# Patient Record
Sex: Male | Born: 1955 | Race: Black or African American | Hispanic: No | Marital: Married | Smoking: Never smoker
Health system: Southern US, Community
[De-identification: ages and names within clinical notes are randomized; demographics above are authoritative.]

## PROBLEM LIST (undated history)

## (undated) DIAGNOSIS — N401 Enlarged prostate with lower urinary tract symptoms: Secondary | ICD-10-CM

## (undated) DIAGNOSIS — M1990 Unspecified osteoarthritis, unspecified site: Secondary | ICD-10-CM

## (undated) DIAGNOSIS — F410 Panic disorder [episodic paroxysmal anxiety] without agoraphobia: Secondary | ICD-10-CM

## (undated) DIAGNOSIS — I10 Essential (primary) hypertension: Secondary | ICD-10-CM

## (undated) DIAGNOSIS — K219 Gastro-esophageal reflux disease without esophagitis: Secondary | ICD-10-CM

## (undated) DIAGNOSIS — N138 Other obstructive and reflux uropathy: Secondary | ICD-10-CM

## (undated) DIAGNOSIS — N189 Chronic kidney disease, unspecified: Secondary | ICD-10-CM

## (undated) DIAGNOSIS — E785 Hyperlipidemia, unspecified: Secondary | ICD-10-CM

## (undated) HISTORY — DX: Chronic kidney disease, unspecified: N18.9

## (undated) HISTORY — DX: Panic disorder (episodic paroxysmal anxiety): F41.0

## (undated) HISTORY — PX: COLONOSCOPY: SHX174

## (undated) HISTORY — DX: Other obstructive and reflux uropathy: N40.1

## (undated) HISTORY — DX: Other obstructive and reflux uropathy: N13.8

---

## 2001-08-24 ENCOUNTER — Encounter: Payer: Self-pay | Admitting: General Surgery

## 2001-08-24 ENCOUNTER — Ambulatory Visit (HOSPITAL_COMMUNITY): Admission: RE | Admit: 2001-08-24 | Discharge: 2001-08-24 | Payer: Self-pay | Admitting: Internal Medicine

## 2006-02-09 ENCOUNTER — Ambulatory Visit (HOSPITAL_COMMUNITY): Admission: RE | Admit: 2006-02-09 | Discharge: 2006-02-09 | Payer: Self-pay | Admitting: Family Medicine

## 2006-03-28 ENCOUNTER — Ambulatory Visit (HOSPITAL_COMMUNITY): Payer: Self-pay | Admitting: Oncology

## 2006-03-28 ENCOUNTER — Encounter (HOSPITAL_COMMUNITY): Admission: RE | Admit: 2006-03-28 | Discharge: 2006-04-27 | Payer: Self-pay | Admitting: Oncology

## 2006-03-28 ENCOUNTER — Encounter: Admission: RE | Admit: 2006-03-28 | Discharge: 2006-03-28 | Payer: Self-pay | Admitting: Oncology

## 2006-04-10 ENCOUNTER — Ambulatory Visit: Payer: Self-pay | Admitting: Internal Medicine

## 2006-06-09 ENCOUNTER — Ambulatory Visit: Payer: Self-pay | Admitting: Internal Medicine

## 2006-11-01 ENCOUNTER — Encounter: Payer: Self-pay | Admitting: Internal Medicine

## 2006-11-01 DIAGNOSIS — I10 Essential (primary) hypertension: Secondary | ICD-10-CM | POA: Insufficient documentation

## 2006-11-01 DIAGNOSIS — F411 Generalized anxiety disorder: Secondary | ICD-10-CM

## 2006-11-01 DIAGNOSIS — E785 Hyperlipidemia, unspecified: Secondary | ICD-10-CM | POA: Insufficient documentation

## 2006-11-01 DIAGNOSIS — K219 Gastro-esophageal reflux disease without esophagitis: Secondary | ICD-10-CM | POA: Insufficient documentation

## 2006-11-01 DIAGNOSIS — J309 Allergic rhinitis, unspecified: Secondary | ICD-10-CM | POA: Insufficient documentation

## 2006-11-01 DIAGNOSIS — F419 Anxiety disorder, unspecified: Secondary | ICD-10-CM | POA: Insufficient documentation

## 2006-11-01 DIAGNOSIS — M1990 Unspecified osteoarthritis, unspecified site: Secondary | ICD-10-CM | POA: Insufficient documentation

## 2008-03-10 ENCOUNTER — Ambulatory Visit: Payer: Self-pay | Admitting: Internal Medicine

## 2008-03-10 ENCOUNTER — Ambulatory Visit (HOSPITAL_COMMUNITY): Admission: RE | Admit: 2008-03-10 | Discharge: 2008-03-10 | Payer: Self-pay | Admitting: Internal Medicine

## 2010-01-14 ENCOUNTER — Ambulatory Visit (HOSPITAL_COMMUNITY): Admission: RE | Admit: 2010-01-14 | Discharge: 2010-01-14 | Payer: Self-pay | Admitting: Family Medicine

## 2011-02-08 NOTE — Op Note (Signed)
NAME:  Bruce Adkins, Bruce Adkins               ACCOUNT NO.:  [id number]   MEDICAL RECORD NO.:  [id number]          PATIENT TYPE:  AMB   LOCATION:  DAY                           FACILITY:  [facility]   PHYSICIAN:  R. Roetta Sessions, M.D. DATE OF BIRTH:  Nov 19, 1955   DATE OF PROCEDURE:  03/10/2008  DATE OF DISCHARGE:                               OPERATIVE REPORT   INDICATIONS FOR PROCEDURE:  A 55 year old African American male with  lower GI tract symptoms referred by Dr. Loleta Chance for colorectal cancer  screening.  He has never had a colonoscopy.  There is no family history  of colon cancer or polyps.  Colonoscopy is now being done as screening  maneuver.  Risks, benefits, alternatives, and limitations have been  reviewed, questions answered, and all parties agreeable.  Please see the  documentation in the medical record.   PROCEDURE NOTE:  O2 saturation, blood pressure, and pulse of the patient  monitored throughout the entire procedure.  Conscious sedation, Versed 3  mg IV, Demerol 75 mg IV in divided doses.   INSTRUMENT:  Pentax video chip system.   FINDINGS:  Digital rectal exam revealed no abnormalities.  Endoscopic  findings:  The prep was good.  Colon:  Colonic mucosa was surveyed from  the rectosigmoid junction through the left transverse, right colon,  appendiceal orifice, ileocecal valve, and cecum.  These structures were  well seen and photographed for the record.  Terminal ileum was intubated  to 10-cm from this level.  The scope was slowly withdrawn.  All previous  mentioned mucosal surfaces were again seen.  The colonic mucosa appeared  normal except for some trivial suction trauma at the base of the cecum.  The scope was pulled down to the rectum with examination of rectal  mucosa, including retroflexed view of the anal verge, demonstrated no  abnormalities.  The patient tolerated the procedure well was reacted in  Endoscopy.   IMPRESSION:  Normal rectum:  Terminal ileum.   RECOMMENDATIONS:  A repeat screening colonoscopy 10 years.      Jonathon Bellows, M.D.  Electronically Signed     RMR/MEDQ  D:  03/10/2008  T:  03/10/2008  Job:  [id number]   cc:   Annia Friendly. Loleta Chance, MD  Fax: [phone]

## 2011-02-11 NOTE — H&P (Signed)
[facility]  Patient:    Bruce Adkins, Bruce Adkins Visit Number: [id number] MRN: [id number]          Service Type: OUT Location: RAD Attending Physician:  Dessa Phi Dictated by:   Elpidio Anis, M.D. Admit Date:  08/24/2001 Discharge Date: 08/24/2001                           History and Physical  HISTORY OF PRESENT ILLNESS:  [age]-year-old male with history of recurrent abdominal pain, cold chills, shakes, nausea with or without vomiting.  He sometimes has fever with diaphoresis.  He has had multiple recent episodes.  These episodes always happen in the late evening or night. There has been no association with meals.  Patient is scheduled for upper endoscopy.  Gallbladder ultrasound is normal.  HIDA scan is pending.  MEDICATIONS:  He takes no chronic medications.  PAST SURGICAL HISTORY:  He has had no prior surgery.  PAST HISTORY:  He has mild anxiety disorder.  REVIEW OF SYSTEMS:  Otherwise negative.  PHYSICAL EXAMINATION:  VITAL SIGNS:  Blood pressure 144/98, pulse 72, respirations 20.  Weight 193 pounds.  HEENT:  Unremarkable.  NECK:  Supple without JVD or bruit.  CHEST:  Clear to auscultation.  No rales, rubs, rhonchi or wheezes.  HEART:  Regular rate and rhythm without murmur, gallop or rub.  ABDOMEN:  Soft, nontender.  No masses.  RECTAL:  Normal.  Stool guaiac-negative.  EXTREMITIES:  No cyanosis, clubbing or edema.  NEUROLOGIC:  No focal motor, sensory or cerebellar deficits.   IMPRESSION: 1. Recurrent abdominal pain, etiology undetermined. 2. Chronic anxiety disorder. 3. Mild hypertension.  PLAN:  Patient will have upper endoscopy. Dictated by:   Elpidio Anis, M.D.  Attending Physician:  Dessa Phi DD:  09/11/01 TD:  09/12/01 Job: [id number]

## 2014-09-26 DIAGNOSIS — I82409 Acute embolism and thrombosis of unspecified deep veins of unspecified lower extremity: Secondary | ICD-10-CM

## 2014-09-26 HISTORY — DX: Acute embolism and thrombosis of unspecified deep veins of unspecified lower extremity: I82.409

## 2015-07-03 ENCOUNTER — Emergency Department (HOSPITAL_COMMUNITY)
Admission: EM | Admit: 2015-07-03 | Discharge: 2015-07-03 | Disposition: A | Discharge status: Home / Self Care | Payer: 59 | Attending: Emergency Medicine | Admitting: Emergency Medicine

## 2015-07-03 ENCOUNTER — Other Ambulatory Visit (HOSPITAL_COMMUNITY): Payer: Self-pay | Admitting: Family Medicine

## 2015-07-03 ENCOUNTER — Encounter (HOSPITAL_COMMUNITY): Payer: Self-pay | Admitting: Emergency Medicine

## 2015-07-03 ENCOUNTER — Ambulatory Visit (HOSPITAL_COMMUNITY)
Admission: RE | Admit: 2015-07-03 | Discharge: 2015-07-03 | Disposition: A | Discharge status: Home / Self Care | Payer: 59 | Source: Ambulatory Visit | Attending: Family Medicine | Admitting: Family Medicine

## 2015-07-03 DIAGNOSIS — R52 Pain, unspecified: Secondary | ICD-10-CM

## 2015-07-03 DIAGNOSIS — L539 Erythematous condition, unspecified: Secondary | ICD-10-CM

## 2015-07-03 DIAGNOSIS — I824Y2 Acute embolism and thrombosis of unspecified deep veins of left proximal lower extremity: Secondary | ICD-10-CM | POA: Diagnosis not present

## 2015-07-03 DIAGNOSIS — R609 Edema, unspecified: Secondary | ICD-10-CM

## 2015-07-03 DIAGNOSIS — R2242 Localized swelling, mass and lump, left lower limb: Secondary | ICD-10-CM | POA: Diagnosis present

## 2015-07-03 LAB — CBC WITH DIFFERENTIAL/PLATELET
BASOS ABS: 0 10*3/uL (ref 0.0–0.1)
BASOS PCT: 1 %
Eosinophils Absolute: 0.2 10*3/uL (ref 0.0–0.7)
Eosinophils Relative: 3 %
HEMATOCRIT: 37.2 % — AB (ref 39.0–52.0)
HEMOGLOBIN: 12.6 g/dL — AB (ref 13.0–17.0)
LYMPHS PCT: 36 %
Lymphs Abs: 1.8 10*3/uL (ref 0.7–4.0)
MCH: 30.7 pg (ref 26.0–34.0)
MCHC: 33.9 g/dL (ref 30.0–36.0)
MCV: 90.7 fL (ref 78.0–100.0)
MONO ABS: 0.4 10*3/uL (ref 0.1–1.0)
Monocytes Relative: 7 %
NEUTROS ABS: 2.6 10*3/uL (ref 1.7–7.7)
NEUTROS PCT: 53 %
Platelets: 190 10*3/uL (ref 150–400)
RBC: 4.1 MIL/uL — AB (ref 4.22–5.81)
RDW: 12.4 % (ref 11.5–15.5)
WBC: 5 10*3/uL (ref 4.0–10.5)

## 2015-07-03 LAB — BASIC METABOLIC PANEL
ANION GAP: 7 (ref 5–15)
BUN: 17 mg/dL (ref 6–20)
CALCIUM: 8.6 mg/dL — AB (ref 8.9–10.3)
CO2: 28 mmol/L (ref 22–32)
Chloride: 105 mmol/L (ref 101–111)
Creatinine, Ser: 1.5 mg/dL — ABNORMAL HIGH (ref 0.61–1.24)
GFR, EST AFRICAN AMERICAN: 58 mL/min — AB (ref >60)
GFR, EST NON AFRICAN AMERICAN: 50 mL/min — AB (ref >60)
GLUCOSE: 92 mg/dL (ref 65–99)
POTASSIUM: 4.3 mmol/L (ref 3.5–5.1)
Sodium: 140 mmol/L (ref 135–145)

## 2015-07-03 MED ORDER — RIVAROXABAN 15 MG PO TABS
15.0000 mg | ORAL_TABLET | Freq: Once | ORAL | Status: AC
  Administered 2015-07-03: 15 mg via ORAL
  Filled 2015-07-03: qty 1

## 2015-07-03 MED ORDER — XARELTO VTE STARTER PACK 15 & 20 MG PO TBPK: 15.0000 mg | ORAL_TABLET | ORAL | Status: DC

## 2015-07-03 NOTE — ED Notes (Signed)
Seen at Dr Loleta Chance on Tuesday for swelling to left lower leg.  Seen in here as outpatient and DVT to left leg.  Denies any pain at this time.

## 2015-07-03 NOTE — ED Notes (Signed)
Patient with no complaints at this time. Respirations even and unlabored. Skin warm/dry. Discharge instructions reviewed with patient at this time. Patient given opportunity to voice concerns/ask questions.  Waiting for pharmacy for Xarelto teaching.

## 2015-07-03 NOTE — Discharge Instructions (Signed)
Deep Vein Thrombosis °A deep vein thrombosis (DVT) is a blood clot (thrombus) that usually occurs in a deep, larger vein of the lower leg or the pelvis, or in an upper extremity such as the arm. These are dangerous and can lead to serious and even life-threatening complications if the clot travels to the lungs. °A DVT can damage the valves in your leg veins so that instead of flowing upward, the blood pools in the lower leg. This is called post-thrombotic syndrome, and it can result in pain, swelling, discoloration, and sores on the leg. °CAUSES °A DVT is caused by the formation of a blood clot in your leg, pelvis, or arm. Usually, several things contribute to the formation of blood clots. A clot may develop when: °· Your blood flow slows down. °· Your vein becomes damaged in some way. °· You have a condition that makes your blood clot more easily. °RISK FACTORS °A DVT is more likely to develop in: °· People who are older, especially over 60 years of age. °· People who are overweight (obese). °· People who sit or lie still for a long time, such as during long-distance travel (over 4 hours), bed rest, hospitalization, or during recovery from certain medical conditions like a stroke. °· People who do not engage in much physical activity (sedentary lifestyle). °· People who have chronic breathing disorders. °· People who have a personal or family history of blood clots or blood clotting disease. °· People who have peripheral vascular disease (PVD), diabetes, or some types of cancer. °· People who have heart disease, especially if the person had a recent heart attack or has congestive heart failure. °· People who have neurological diseases that affect the legs (leg paresis). °· People who have had a traumatic injury, such as breaking a hip or leg. °· People who have recently had major or lengthy surgery, especially on the hip, knee, or abdomen. °· People who have had a central line placed inside a large vein. °· People  who take medicines that contain the hormone estrogen. These include birth control pills and hormone replacement therapy. °· Pregnancy or during childbirth or the postpartum period. °· Long plane flights (over 8 hours). °SIGNS AND SYMPTOMS °Symptoms of a DVT can include:  °· Swelling of your leg or arm, especially if one side is much worse. °· Warmth and redness of your leg or arm, especially if one side is much worse. °· Pain in your arm or leg. If the clot is in your leg, symptoms may be more noticeable or worse when you stand or walk. °· A feeling of pins and needles, if the clot is in the arm. °The symptoms of a DVT that has traveled to the lungs (pulmonary embolism, PE) usually start suddenly and include: °· Shortness of breath while active or at rest. °· Coughing or coughing up blood or blood-tinged mucus. °· Chest pain that is often worse with deep breaths. °· Rapid or irregular heartbeat. °· Feeling light-headed or dizzy. °· Fainting. °· Feeling anxious. °· Sweating. °There may also be pain and swelling in a leg if that is where the blood clot started. °These symptoms may represent a serious problem that is an emergency. Do not wait to see if the symptoms will go away. Get medical help right away. Call your local emergency services (911 in the [place] Do not drive yourself to the hospital. °DIAGNOSIS °Your health care provider will take a medical history and perform a physical exam. You may also   have other tests, including: °· Blood tests to assess the clotting properties of your blood. °· Imaging tests, such as CT, ultrasound, MRI, X-ray, and other tests to see if you have clots anywhere in your body. °TREATMENT °After a DVT is identified, it can be treated. The type of treatment that you receive depends on many factors, such as the cause of your DVT, your risk for bleeding or developing more clots, and other medical conditions that you have. Sometimes, a combination of treatments is necessary. Treatment  options may be combined and include: °· Monitoring the blood clot with ultrasound. °· Taking medicines by mouth, such as newer blood thinners (anticoagulants), thrombolytics, or warfarin. °· Taking anticoagulant medicine by injection or through an IV tube. °· Wearing compression stockings or using different types of devices. °· Surgery (rare) to remove the blood clot or to place a filter in your abdomen to stop the blood clot from traveling to your lungs. °Treatments for a DVT are often divided into immediate treatment and long-term treatment (up to 3 months after DVT). You can work with your health care provider to choose the treatment program that is best for you. °HOME CARE INSTRUCTIONS °If you are taking a newer oral anticoagulant: °· Take the medicine every single day at the same time each day. °· Understand what foods and drugs interact with this medicine. °· Understand that there are no regular blood tests required when using this medicine. °· Understand the side effects of this medicine, including excessive bruising or bleeding. Ask your health care provider or pharmacist about other possible side effects. °If you are taking warfarin: °· Understand how to take warfarin and know which foods can affect how warfarin works in your body. °· Understand that it is dangerous to take too much or too little warfarin. Too much warfarin increases the risk of bleeding. Too little warfarin continues to allow the risk for blood clots. °· Follow your PT and INR blood testing schedule. The PT and INR results allow your health care provider to adjust your dose of warfarin. It is very important that you have your PT and INR tested as often as told by your health care provider. °· Avoid major changes in your diet, or tell your health care provider before you change your diet. Arrange a visit with a registered dietitian to answer your questions. Many foods, especially foods that are high in vitamin K, can interfere with warfarin  and affect the PT and INR results. Eat a consistent amount of foods that are high in vitamin K, such as: °¨ Spinach, kale, broccoli, cabbage, collard greens, turnip greens, Brussels sprouts, peas, cauliflower, seaweed, and parsley. °¨ Beef liver and pork liver. °¨ Green tea. °¨ Soybean oil. °· Tell your health care provider about any and all medicines, vitamins, and supplements that you take, including aspirin and other over-the-counter anti-inflammatory medicines. Be especially cautious with aspirin and anti-inflammatory medicines. Do not take those before you ask your health care provider if it is safe to do so. This is important because many medicines can interfere with warfarin and affect the PT and INR results. °· Do not start or stop taking any over-the-counter or prescription medicine unless your health care provider or pharmacist tells you to do so. °If you take warfarin, you will also need to do these things: °· Hold pressure over cuts for longer than usual. °· Tell your dentist and other health care providers that you are taking warfarin before you have any procedures in which   bleeding may occur. °· Avoid alcohol or drink very small amounts. Tell your health care provider if you change your alcohol intake. °· Do not use tobacco products, including cigarettes, chewing tobacco, and e-cigarettes. If you need help quitting, ask your health care provider. °· Avoid contact sports. °General Instructions °· Take over-the-counter and prescription medicines only as told by your health care provider. Anticoagulant medicines can have side effects, including easy bruising and difficulty stopping bleeding. If you are prescribed an anticoagulant, you will also need to do these things: °¨ Hold pressure over cuts for longer than usual. °¨ Tell your dentist and other health care providers that you are taking anticoagulants before you have any procedures in which bleeding may occur. °¨ Avoid contact sports. °· Wear a medical  alert bracelet or carry a medical alert card that says you have had a PE. °· Ask your health care provider how soon you can go back to your normal activities. Stay active to prevent new blood clots from forming. °· Make sure to exercise while traveling or when you have been sitting or standing for a long period of time. It is very important to exercise. Exercise your legs by walking or by tightening and relaxing your leg muscles often. Take frequent walks. °· Wear compression stockings as told by your health care provider to help prevent more blood clots from forming. °· Do not use tobacco products, including cigarettes, chewing tobacco, and e-cigarettes. If you need help quitting, ask your health care provider. °· Keep all follow-up appointments with your health care provider. This is important. °PREVENTION °Take these actions to decrease your risk of developing another DVT: °· Exercise regularly. For at least 30 minutes every day, engage in: °¨ Activity that involves moving your arms and legs. °¨ Activity that encourages good blood flow through your body by increasing your heart rate. °· Exercise your arms and legs every hour during long-distance travel (over 4 hours). Drink plenty of water and avoid drinking alcohol while traveling. °· Avoid sitting or lying in bed for long periods of time without moving your legs. °· Maintain a weight that is appropriate for your height. Ask your health care provider what weight is healthy for you. °· If you are a woman who is over 35 years of age, avoid unnecessary use of medicines that contain estrogen. These include birth control pills. °· Do not smoke, especially if you take estrogen medicines. If you need help quitting, ask your health care provider. °If you are hospitalized, prevention measures may include: °· Early walking after surgery, as soon as your health care provider says that it is safe. °· Receiving anticoagulants to prevent blood clots. If you cannot take  anticoagulants, other options may be available, such as wearing compression stockings or using different types of devices. °SEEK IMMEDIATE MEDICAL CARE IF: °· You have new or increased pain, swelling, or redness in an arm or leg. °· You have numbness or tingling in an arm or leg. °· You have shortness of breath while active or at rest. °· You have chest pain. °· You have a rapid or irregular heartbeat. °· You feel light-headed or dizzy. °· You cough up blood. °· You notice blood in your vomit, bowel movement, or urine. °These symptoms may represent a serious problem that is an emergency. Do not wait to see if the symptoms will go away. Get medical help right away. Call your local emergency services (911 in the [place] Do not drive yourself to the hospital. °  °  This information is not intended to replace advice given to you by your health care provider. Make sure you discuss any questions you have with your health care provider.   Document Released: 09/12/2005 Document Revised: 06/03/2015 Document Reviewed: 01/07/2015 Elsevier Interactive Patient Education 2016 [employer].             Information on my medicine - XARELTO (rivaroxaban)  This medication education was reviewed with me or my healthcare representative as part of my discharge preparation.   WHY WAS XARELTO PRESCRIBED FOR YOU? Xarelto was prescribed to treat blood clots that may have been found in the veins of your legs (deep vein thrombosis) or in your lungs (pulmonary embolism) and to reduce the risk of them occurring again.  What do you need to know about Xarelto? The starting dose is one 15 mg tablet taken TWICE daily with food for the FIRST 21 DAYS then on (enter date)  10/28  the dose is changed to one 20 mg tablet taken ONCE A DAY with your evening meal.  DO NOT stop taking Xarelto without talking to the health care provider who prescribed the medication.  Refill your prescription for 20 mg tablets before you run  out.  After discharge, you should have regular check-up appointments with your healthcare provider that is prescribing your Xarelto.  In the future your dose may need to be changed if your kidney function changes by a significant amount.  What do you do if you miss a dose? If you are taking Xarelto TWICE DAILY and you miss a dose, take it as soon as you remember. You may take two 15 mg tablets (total 30 mg) at the same time then resume your regularly scheduled 15 mg twice daily the next day.  If you are taking Xarelto ONCE DAILY and you miss a dose, take it as soon as you remember on the same day then continue your regularly scheduled once daily regimen the next day. Do not take two doses of Xarelto at the same time.   Important Safety Information Xarelto is a blood thinner medicine that can cause bleeding. You should call your healthcare provider right away if you experience any of the following: ? Bleeding from an injury or your nose that does not stop. ? Unusual colored urine (red or dark brown) or unusual colored stools (red or black). ? Unusual bruising for unknown reasons. ? A serious fall or if you hit your head (even if there is no bleeding).  Some medicines may interact with Xarelto and might increase your risk of bleeding while on Xarelto. To help avoid this, consult your healthcare provider or pharmacist prior to using any new prescription or non-prescription medications, including herbals, vitamins, non-steroidal anti-inflammatory drugs (NSAIDs) and supplements.  This website has more information on Xarelto: VisitDestination.com.br.

## 2015-07-03 NOTE — ED Provider Notes (Signed)
CSN: [id number]     Arrival date & time 07/03/15  1300 History   First MD Initiated Contact with Patient 07/03/15 1309     Chief Complaint  Patient presents with  . DVT     (Consider location/radiation/quality/duration/timing/severity/associated sxs/prior Treatment) HPI  59 year old male presents from ultrasound after being diagnosed with a DVT in his left lower extremity. Patient saw his PCP 3 days ago, Dr. Loleta Chance, who ordered a DVT ultrasound. Patient has been having left leg swelling and tightness for 6 days. 2 weeks ago patient noticed he had some tightness in his proximal leg that resolved. Denies any trauma. Has noticed swelling that has improved with elevation and ice. Was started on NSAIDs by his PCP. Denies chest pain her shorts breath. 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 No surgeries or history of cancer. No prior DVT. At this point he denies any pain in his leg. No weakness or numbness.  History reviewed. No pertinent past medical history. History reviewed. No pertinent past surgical history. Family History  Problem Relation Age of Onset  . Heart failure Mother   . Hypertension Father   . Cancer Brother    Social History  Substance Use Topics  . Smoking status: Never Smoker   . Smokeless tobacco: None  . Alcohol Use: No    Review of Systems  Respiratory: Negative for shortness of breath.   Cardiovascular: Positive for leg swelling. Negative for chest pain.  Neurological: Negative for weakness and numbness.  All other systems reviewed and are negative.     Allergies  Review of patient's allergies indicates no known allergies.  Home Medications   Prior to Admission medications   Not on File   BP 122/72 mmHg  Pulse 70  Temp(Src) 97.8 F (36.6 C) (Oral)  Resp 14  Ht 6' (1.829 m)  Wt 204 lb (92.534 kg)  BMI 27.66 kg/m2  SpO2 100% Physical Exam  Constitutional: He is oriented to person, place,  and time. He appears well-developed and well-nourished.  HENT:  Head: Normocephalic and atraumatic.  Right Ear: External ear normal.  Left Ear: External ear normal.  Nose: Nose normal.  Eyes: Right eye exhibits no discharge. Left eye exhibits no discharge.  Neck: Neck supple.  Cardiovascular: Normal rate, regular rhythm, normal heart sounds and intact distal pulses.   Pulses:      Dorsalis pedis pulses are 2+ on the right side, and 2+ on the left side.  Pulmonary/Chest: Effort normal and breath sounds normal.  Abdominal: He exhibits no distension.  Musculoskeletal:  Diffuse lower leg swelling of left leg. No crepitus, redness or tenderness.   Neurological: He is alert and oriented to person, place, and time.  Skin: Skin is warm and dry.  Nursing note and vitals reviewed.   ED Course  Procedures (including critical care time) Labs Review Labs Reviewed  BASIC METABOLIC PANEL - Abnormal; Notable for the following:    Creatinine, Ser 1.50 (*)    Calcium 8.6 (*)    GFR calc non Af Amer 50 (*)    GFR calc Af Amer 58 (*)    All other components within normal limits  CBC WITH DIFFERENTIAL/PLATELET - Abnormal; Notable for the following:    RBC 4.10 (*)    Hemoglobin 12.6 (*)    HCT 37.2 (*)    All other components within normal limits    Imaging Review US Venous Img Lower Unilateral Left  07/03/2015   CLINICAL DATA:  Pain, swelling, redness in the left lower extremity  EXAM: LEFT LOWER EXTREMITY VENOUS DOPPLER ULTRASOUND  TECHNIQUE: Gray-scale sonography with graded compression, as well as color Doppler and duplex ultrasound were performed to evaluate the lower extremity deep venous systems from the level of the common femoral vein and including the common femoral, femoral, profunda femoral, popliteal and calf veins including the posterior tibial, peroneal and gastrocnemius veins when visible. The superficial great saphenous vein was also interrogated. Spectral Doppler was utilized to  evaluate flow at rest and with distal augmentation maneuvers in the common femoral, femoral and popliteal veins.  COMPARISON:  None.  FINDINGS: Contralateral Common Femoral Vein: Respiratory phasicity is normal and symmetric with the symptomatic side. No evidence of thrombus. Normal compressibility.  Common Femoral Vein: Nonocclusive thrombus with normal phasicity.  Saphenofemoral Junction: Nonocclusive thrombus with normal phasicity.  Profunda Femoral Vein: Occlusive thrombus without phasicity.  Femoral Vein: Nonocclusive thrombus with normal phasicity.  Popliteal Vein: Occlusive thrombus without phasicity.  Calf Veins: Occlusive thrombus in the posterior tibial vein. Normally compressible peroneal and anterior tibial veins without thrombus.  Superficial Great Saphenous Vein: No evidence of thrombus. Normal compressibility and flow on color Doppler imaging.  Venous Reflux:  None.  Other Findings:  None.  IMPRESSION: Deep venous thrombosis extending from the left posterior tibial vein to the common femoral vein. Areas of occlusive thrombus in the profunda femoral vein and popliteal veins.   Electronically Signed   By: Elige Ko   On: 07/03/2015 12:37   I have personally reviewed and evaluated these images and lab results as part of my medical decision-making.   EKG Interpretation None      MDM   Final diagnoses:  Acute deep vein thrombosis (DVT) of proximal vein of left lower extremity (HCC)    Patient is well appearing, has NV intact extremities. Appears to have uncomplicated DVT, no signs of PE or more severe disease such as phlegmasia. Patient has no history of bleeding, stroke, cancer, or other contraindications to anticoagulation. Plan to start on Xarelto. Creatinine is mildly elevated, no baseline. Appears adequately enough for Xarelto at this time.. Plan to follow-up closely with his PCP and discussed strict return precautions.     Pricilla Loveless, MD 07/03/15 [phone]

## 2017-04-01 DIAGNOSIS — R040 Epistaxis: Secondary | ICD-10-CM | POA: Diagnosis not present

## 2017-04-01 DIAGNOSIS — I10 Essential (primary) hypertension: Secondary | ICD-10-CM | POA: Diagnosis not present

## 2017-04-01 DIAGNOSIS — M25559 Pain in unspecified hip: Secondary | ICD-10-CM | POA: Diagnosis not present

## 2017-07-03 ENCOUNTER — Other Ambulatory Visit (HOSPITAL_COMMUNITY): Payer: Self-pay | Admitting: Family Medicine

## 2017-07-03 DIAGNOSIS — M79662 Pain in left lower leg: Secondary | ICD-10-CM

## 2017-07-03 DIAGNOSIS — Z23 Encounter for immunization: Secondary | ICD-10-CM | POA: Diagnosis not present

## 2017-07-03 DIAGNOSIS — I10 Essential (primary) hypertension: Secondary | ICD-10-CM | POA: Diagnosis not present

## 2017-07-03 DIAGNOSIS — M79605 Pain in left leg: Secondary | ICD-10-CM | POA: Diagnosis not present

## 2017-07-03 DIAGNOSIS — M7989 Other specified soft tissue disorders: Secondary | ICD-10-CM

## 2018-01-22 ENCOUNTER — Encounter: Payer: Self-pay | Admitting: Internal Medicine

## 2018-02-06 ENCOUNTER — Other Ambulatory Visit (HOSPITAL_COMMUNITY): Payer: Self-pay | Admitting: Family Medicine

## 2018-02-06 DIAGNOSIS — H938X1 Other specified disorders of right ear: Secondary | ICD-10-CM

## 2018-02-09 ENCOUNTER — Ambulatory Visit (HOSPITAL_COMMUNITY): Admission: RE | Admit: 2018-02-09 | Payer: 59 | Source: Ambulatory Visit

## 2019-02-22 ENCOUNTER — Other Ambulatory Visit: Payer: Self-pay

## 2019-02-22 ENCOUNTER — Emergency Department (HOSPITAL_COMMUNITY): Payer: PRIVATE HEALTH INSURANCE

## 2019-02-22 ENCOUNTER — Emergency Department (HOSPITAL_COMMUNITY)
Admission: EM | Admit: 2019-02-22 | Discharge: 2019-02-22 | Disposition: A | Discharge status: Home / Self Care | Payer: PRIVATE HEALTH INSURANCE | Attending: Emergency Medicine | Admitting: Emergency Medicine

## 2019-02-22 ENCOUNTER — Ambulatory Visit: Payer: Self-pay | Admitting: Family Medicine

## 2019-02-22 ENCOUNTER — Encounter (HOSPITAL_COMMUNITY): Payer: Self-pay | Admitting: Emergency Medicine

## 2019-02-22 DIAGNOSIS — M79602 Pain in left arm: Secondary | ICD-10-CM

## 2019-02-22 DIAGNOSIS — Z79899 Other long term (current) drug therapy: Secondary | ICD-10-CM | POA: Insufficient documentation

## 2019-02-22 DIAGNOSIS — I82622 Acute embolism and thrombosis of deep veins of left upper extremity: Secondary | ICD-10-CM

## 2019-02-22 DIAGNOSIS — I10 Essential (primary) hypertension: Secondary | ICD-10-CM | POA: Insufficient documentation

## 2019-02-22 HISTORY — DX: Gastro-esophageal reflux disease without esophagitis: K21.9

## 2019-02-22 HISTORY — DX: Essential (primary) hypertension: I10

## 2019-02-22 HISTORY — DX: Unspecified osteoarthritis, unspecified site: M19.90

## 2019-02-22 HISTORY — DX: Hyperlipidemia, unspecified: E78.5

## 2019-02-22 LAB — BASIC METABOLIC PANEL
Anion gap: 8 (ref 5–15)
BUN: 19 mg/dL (ref 8–23)
CO2: 26 mmol/L (ref 22–32)
Calcium: 9 mg/dL (ref 8.9–10.3)
Chloride: 104 mmol/L (ref 98–111)
Creatinine, Ser: 1.34 mg/dL — ABNORMAL HIGH (ref 0.61–1.24)
GFR calc Af Amer: >60 mL/min (ref >60)
GFR calc non Af Amer: 56 mL/min — ABNORMAL LOW (ref >60)
Glucose, Bld: 122 mg/dL — ABNORMAL HIGH (ref 70–99)
Potassium: 4.1 mmol/L (ref 3.5–5.1)
Sodium: 138 mmol/L (ref 135–145)

## 2019-02-22 LAB — CBC WITH DIFFERENTIAL/PLATELET
Abs Immature Granulocytes: 0.01 10*3/uL (ref 0.00–0.07)
Basophils Absolute: 0 10*3/uL (ref 0.0–0.1)
Basophils Relative: 1 %
Eosinophils Absolute: 0.1 10*3/uL (ref 0.0–0.5)
Eosinophils Relative: 2 %
HCT: 41.9 % (ref 39.0–52.0)
Hemoglobin: 13.8 g/dL (ref 13.0–17.0)
Immature Granulocytes: 0 %
Lymphocytes Relative: 36 %
Lymphs Abs: 1.6 10*3/uL (ref 0.7–4.0)
MCH: 30.5 pg (ref 26.0–34.0)
MCHC: 32.9 g/dL (ref 30.0–36.0)
MCV: 92.7 fL (ref 80.0–100.0)
Monocytes Absolute: 0.4 10*3/uL (ref 0.1–1.0)
Monocytes Relative: 8 %
Neutro Abs: 2.4 10*3/uL (ref 1.7–7.7)
Neutrophils Relative %: 53 %
Platelets: 175 10*3/uL (ref 150–400)
RBC: 4.52 MIL/uL (ref 4.22–5.81)
RDW: 13.3 % (ref 11.5–15.5)
WBC: 4.5 10*3/uL (ref 4.0–10.5)
nRBC: 0 % (ref 0.0–0.2)

## 2019-02-22 MED ORDER — RIVAROXABAN 15 MG PO TABS
15.0000 mg | ORAL_TABLET | Freq: Once | ORAL | Status: AC
  Administered 2019-02-22: 15 mg via ORAL
  Filled 2019-02-22: qty 1

## 2019-02-22 MED ORDER — RIVAROXABAN (XARELTO) EDUCATION KIT FOR DVT/PE PATIENTS
PACK | Freq: Once | Status: AC
  Administered 2019-02-22: 15:00:00

## 2019-02-22 MED ORDER — RIVAROXABAN (XARELTO) VTE STARTER PACK (15 & 20 MG): ORAL_TABLET | ORAL | 0 refills | Status: DC

## 2019-02-22 NOTE — ED Triage Notes (Signed)
Lifted a television about to weeks ago.  Pt self treated with OTC meds without relief.  Left arm pain, rating pain 8/10.  History of blood clot to legs.

## 2019-02-22 NOTE — ED Provider Notes (Signed)
[facility] EMERGENCY DEPARTMENT Provider Note   CSN: [id number] Arrival date & time: 02/22/19  1103    History   Chief Complaint Chief Complaint  Patient presents with   Arm Pain    left    HPI Bruce Adkins is a 63 y.o. male.     HPI Pt was seen at 1110. Per pt, c/o gradual onset and persistence of constant LUE "pains" for the past 2 weeks. Pt states he was lifting a TV before the pains started. Pt is right handed. Pt states the pain intially began in his left medial-dorsal upper arm; this has resolved. Pt states the pain as "moved down my bicep" and now is located in his left proximal medial-volar forearm. Pt states this area has become "swollen" over the past few days. Pt has been taking OTC pain meds without improvement. 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 Pt denies hx of hypercoagulability, but then states "all my kids have problems with blood clots in their legs." Denies fevers, cough, known COVID+ exposure. Denies direct injury to LUE. Denies CP/palpitations, no SOB/cough, no abd pain, no N/V/D, no back pain, no neck pain, no rash, no focal motor weakness, no tingling/numbness in extremities.      Past Medical History:  Diagnosis Date   Arthritis    DVT (deep venous thrombosis) (HCC) 2016   LLE   GERD (gastroesophageal reflux disease)    Hyperlipidemia    Hypertension     Patient Active Problem List   Diagnosis Date Noted   HYPERLIPIDEMIA 11/01/2006   ANXIETY 11/01/2006   HYPERTENSION 11/01/2006   ALLERGIC RHINITIS 11/01/2006   GERD 11/01/2006   OSTEOARTHRITIS 11/01/2006    No past surgical history on file.      Home Medications    Prior to Admission medications   Medication Sig Start Date End Date Taking? Authorizing Provider  Enalapril-Hydrochlorothiazide 5-12.5 MG tablet Take 1 tablet by mouth daily.  06/05/15   [provider]  PRESCRIPTION MEDICATION Take 1 tablet by mouth 3 (three) times daily as needed (inflammation).    [provider]  tamsulosin (FLOMAX) 0.4 MG CAPS capsule Take 1 capsule by mouth daily. 06/23/15   [provider]  XARELTO STARTER PACK 15 & 20 MG TBPK Take 15-20 mg by mouth as directed. Take as directed on package: Start with one  tablet by mouth twice a day with food. On Day 22, switch to one  tablet once a day with food. 07/03/15   Pricilla Loveless, MD    Family History Family History  Problem Relation Age of Onset   Heart failure Mother    Hypertension Father    Cancer Brother     Social History Social History   Tobacco Use   Smoking status: Never Smoker  Substance Use Topics   Alcohol use: No   Drug use: Not on file     Allergies   Patient has no known allergies.   Review of Systems Review of Systems ROS: Statement: All systems negative except as marked or noted in the HPI; Constitutional: Negative for fever and chills. ; ; Eyes: Negative for eye pain, redness and discharge. ; ; ENMT: Negative for ear pain, hoarseness, nasal congestion, sinus pressure and sore throat. ; ; Cardiovascular: Negative for chest pain, palpitations, diaphoresis, dyspnea and peripheral edema. ; ; Respiratory: Negative for cough,  wheezing and stridor. ; ; Gastrointestinal: Negative for nausea, vomiting, diarrhea, abdominal pain, blood in stool, hematemesis, jaundice and rectal bleeding. . ; ; Genitourinary: Negative for dysuria, flank pain and hematuria. ; ; Musculoskeletal: +LUE pain/swelling. Negative for back pain and neck pain. Negative for deformity and trauma.; ; Skin: Negative for pruritus, rash, abrasions, blisters, bruising and skin lesion.; ; Neuro: Negative for headache, lightheadedness and neck stiffness. Negative for weakness, altered level of consciousness, altered mental status, extremity weakness, paresthesias, involuntary  movement, seizure and syncope.       Physical Exam Updated Vital Signs BP 117/90 (BP Location: Right Arm)    Pulse 79    Temp 97.8 F (36.6 C) (Oral)    Resp 14    Ht 5\' 11"  (1.803 m)    Wt 90.7 kg    SpO2 100%    BMI 27.89 kg/m   Physical Exam 1115: Physical examination:  Nursing notes reviewed; Vital signs and O2 SAT reviewed;  Constitutional: Well developed, Well nourished, Well hydrated, In no acute distress; Head:  Normocephalic, atraumatic; Eyes: EOMI, PERRL, No scleral icterus; ENMT: Mouth and pharynx normal, Mucous membranes moist; Neck: Supple, Full range of motion, No lymphadenopathy; Cardiovascular: Regular rate and rhythm, No gallop; Respiratory: Breath sounds clear & equal bilaterally, No wheezes.  Speaking full sentences with ease, Normal respiratory effort/excursion; Chest: Nontender, Movement normal; Abdomen: Soft, Nontender, Nondistended, Normal bowel sounds; Genitourinary: No CVA tenderness; Extremities: Peripheral pulses normal, No deformity. FROM and NT left shoulder/elbow/wrist/hand. No joints edema, warmth, erythema. No LUE rash. LUE muscles compartments soft, strong radial pulse, NMS intact left hand. Mild localized edema noted left proximal medial-volar forearm, no rash, no ecchymosis, no open wounds.  No calf edema or asymmetry.; Neuro: AA&Ox3, Major CN grossly intact.  Speech clear. No gross focal motor or sensory deficits in extremities.; Skin: Color normal, Warm, Dry.     ED Treatments / Results  Labs (all labs ordered are listed, but only abnormal results are displayed)   EKG None  Radiology   Procedures Procedures (including critical care time)  Medications Ordered in ED Medications - No data to display   Initial Impression / Assessment and Plan / ED Course  I have reviewed the triage vital signs and the nursing notes.  Pertinent labs & imaging results that were available during my care of the patient were reviewed by me and considered in my  medical decision making (see chart for details).     MDM Reviewed: nursing note, previous chart and vitals Reviewed previous: labs Interpretation: x-ray, ultrasound and labs     Results for orders placed or performed during the hospital encounter of 02/22/19  Basic metabolic panel  Result Value Ref Range   Sodium 138 135 - 145 mmol/L   Potassium 4.1 3.5 - 5.1 mmol/L   Chloride 104 98 - 111 mmol/L   CO2 26 22 - 32 mmol/L   Glucose, Bld 122 (H) 70 - 99 mg/dL   BUN 19 8 - 23 mg/dL   Creatinine, Ser 4.35 (H) 0.61 - 1.24 mg/dL   Calcium 9.0 8.9 - 68.6 mg/dL   GFR calc non Af Amer 56 (L) >60 mL/min   GFR calc Af Amer >60 >60 mL/min   Anion gap 8 5 - 15  CBC with Differential  Result Value Ref Range   WBC 4.5 4.0 - 10.5 K/uL   RBC 4.52 4.22 - 5.81 MIL/uL   Hemoglobin 13.8 13.0 - 17.0 g/dL   HCT 16.8 37.2 - 90.2 %  MCV 92.7 80.0 - 100.0 fL   MCH 30.5 26.0 - 34.0 pg   MCHC 32.9 30.0 - 36.0 g/dL   RDW 16.113.3 09.611.5 - 04.515.5 %   Platelets 175 150 - 400 K/uL   nRBC 0.0 0.0 - 0.2 %   Neutrophils Relative % 53 %   Neutro Abs 2.4 1.7 - 7.7 K/uL   Lymphocytes Relative 36 %   Lymphs Abs 1.6 0.7 - 4.0 K/uL   Monocytes Relative 8 %   Monocytes Absolute 0.4 0.1 - 1.0 K/uL   Eosinophils Relative 2 %   Eosinophils Absolute 0.1 0.0 - 0.5 K/uL   Basophils Relative 1 %   Basophils Absolute 0.0 0.0 - 0.1 K/uL   Immature Granulocytes 0 %   Abs Immature Granulocytes 0.01 0.00 - 0.07 K/uL     Dg Shoulder Left Result Date: 02/22/2019 CLINICAL DATA:  Pain and redness extending from the left axilla to the left wrist after lifting a TV 2 weeks ago. EXAM: LEFT SHOULDER - 2+ VIEW COMPARISON:  None. FINDINGS: There is no evidence of fracture or dislocation. There is no evidence of arthropathy or other focal bone abnormality. Soft tissues are unremarkable. IMPRESSION: Negative. Electronically Signed   By: Sebastian AcheAllen  Grady M.D.   On: 02/22/2019 12:07    Dg Elbow Complete Left Result Date:  02/22/2019 CLINICAL DATA:  Pain and redness of the left upper extremity. EXAM: LEFT ELBOW - COMPLETE 3+ VIEW COMPARISON:  None. FINDINGS: There is no evidence of fracture, dislocation, or joint effusion. There is no evidence of arthropathy or other focal bone abnormality. Soft tissues are unremarkable. IMPRESSION: Negative. Electronically Signed   By: Elige KoHetal  Patel   On: 02/22/2019 12:11   Dg Wrist Complete Left Result Date: 02/22/2019 CLINICAL DATA:  Pain and redness extending from the left axilla to the left wrist after lifting a TV 2 weeks ago. EXAM: LEFT WRIST - COMPLETE 3+ VIEW COMPARISON:  None. FINDINGS: There is no evidence of fracture or dislocation. There is no evidence of arthropathy or other focal bone abnormality. Soft tissues are unremarkable. IMPRESSION: Negative. Electronically Signed   By: Sebastian AcheAllen  Grady M.D.   On: 02/22/2019 12:05   [place] Venous Img Upper Left (dvt Study) Result Date: 02/22/2019 CLINICAL DATA:  Left upper extremity pain, lifting injury EXAM: LEFT UPPER EXTREMITY VENOUS DOPPLER ULTRASOUND TECHNIQUE: Gray-scale sonography with graded compression, as well as color Doppler and duplex ultrasound were performed to evaluate the upper extremity deep venous system from the level of the subclavian vein and including the jugular, axillary, basilic, radial, ulnar and upper cephalic vein. Spectral Doppler was utilized to evaluate flow at rest and with distal augmentation maneuvers. COMPARISON:  None. FINDINGS: Contralateral Subclavian Vein: Respiratory phasicity is normal and symmetric with the symptomatic side. No evidence of thrombus. Normal compressibility. Internal Jugular Vein: No evidence of thrombus. Normal compressibility, respiratory phasicity and response to augmentation. Subclavian Vein: No evidence of thrombus. Normal compressibility, respiratory phasicity and response to augmentation. Axillary Vein: Positive for intraluminal thrombus appearing occlusive. Vessel noncompressible.  Phasic flow not demonstrated. Augmentation not performed. Cephalic Vein: Positive for intraluminal occlusive thrombus. Vessel noncompressible. Phasic flow not demonstrated. Basilic Vein: Positive for intraluminal occlusive thrombus. Vessel noncompressible. Phasic flow not demonstrated Brachial Veins: Positive for occlusive thrombus. Vessel is noncompressible. Phasic flow not demonstrated Radial Veins: No evidence of thrombus. Normal compressibility, respiratory phasicity and response to augmentation. Ulnar Veins: No evidence of thrombus. Normal compressibility, respiratory phasicity and response to augmentation. Venous Reflux:  Not assessed  Other Findings:  None visualized. IMPRESSION: Positive exam for acute occlusive left axillary and brachial DVT with associated occlusive superficial thrombosis of the left cephalic and basilic veins. These results will be called to the ordering clinician or representative by the Radiologist Assistant, and communication documented in the PACS or zVision Dashboard. Electronically Signed   By: Judie Petit.  Shick M.D.   On: 02/22/2019 12:44     Bruce Adkins was evaluated in Emergency Department on 02/22/2019 for the symptoms described in the history of present illness. He was evaluated in the context of the global COVID-19 pandemic, which necessitated consideration that the patient might be at risk for infection with the SARS-CoV-2 virus that causes COVID-19. Institutional protocols and algorithms that pertain to the evaluation of patients at risk for COVID-19 are in a state of rapid change based on information released by regulatory bodies including the CDC and federal and state organizations. These policies and algorithms were followed during the patient's care in the ED.      1250:  Vasc US +DVT. 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  Denies CP/SOB. T/C returned from [facility] Heme/Onc Dr. Ellin Saba, case discussed, including:  HPI, pertinent PM/SHx, VS/PE, dx testing, ED course and  treatment: given pt's 2nd unprovoked DVT and his endorsement of FHx "clots," pt will need to start anticoagulation (ie: xarelto) and will need lifelong anticoagulation, no need to check hypercoagulability labs at this time, have pt f/u office. Dx and testing, as well as d/w Heme/Onc MD, d/w pt.  Questions answered.  Verb understanding, agreeable with plan.   1430:  Labs as above. PhamD called: OK for xarelto as usual dosing. Will give 1st dose while in the ED. Will d/c stable.      Final Clinical Impressions(s) / ED Diagnoses   Final diagnoses:  None    ED Discharge Orders    None       Samuel Jester, DO 02/27/19 1610

## 2019-02-22 NOTE — Progress Notes (Signed)
ANTICOAGULATION CONSULT NOTE - Initial Consult  Pharmacy Consult for Xarelto Indication: DVT  No Known Allergies  Patient Measurements: Height: 5\' 11"  (180.3 cm) Weight: 200 lb (90.7 kg) IBW/kg (Calculated) : 75.3  Vital Signs: Temp: 97.8 F (36.6 C) (05/29 1113) Temp Source: Oral (05/29 1113) BP: 121/76 (05/29 1430) Pulse Rate: 72 (05/29 1430)  Labs: Recent Labs    02/22/19 1324  HGB 13.8  HCT 41.9  PLT 175  CREATININE 1.34*    Estimated Creatinine Clearance: 65.9 mL/min (A) (by C-G formula based on SCr of 1.34 mg/dL (H)).   Medical History: Past Medical History:  Diagnosis Date  . Arthritis   . DVT (deep venous thrombosis) (HCC) 2016   LLE  . GERD (gastroesophageal reflux disease)   . Hyperlipidemia   . Hypertension     Medications:  See med rec  Assessment: Patient presented to ED with left arm pain and has a hsitory of blood clots in legs.  Venous doppler is positive for acute occlusive left axillary and brachial DVT with associated occlusive superficial thrombosis of the left cephalic and basilic veins. Pharmacy asked to start xarelto  Goal of Therapy:   Monitor platelets by anticoagulation protocol: Yes   Plan:  Xarelto 15mg  po BID for 3 weeks, then 20mg  daily Monitor for S/S of bleeding Education on xarelto  Elder Cyphers, BS Pharm D, BCPS Clinical Pharmacist Pager [phone] 02/22/2019,2:45 PM

## 2019-02-22 NOTE — Discharge Instructions (Signed)
Take the prescription as directed.  Call Hematologist/Oncologist today to schedule a follow up appointment within the next week.  Return to the Emergency Department immediately sooner if worsening.

## 2019-03-08 ENCOUNTER — Ambulatory Visit: Payer: PRIVATE HEALTH INSURANCE | Attending: Family Medicine | Admitting: Nurse Practitioner

## 2019-03-08 ENCOUNTER — Other Ambulatory Visit: Payer: Self-pay

## 2019-03-08 ENCOUNTER — Encounter: Payer: Self-pay | Admitting: Nurse Practitioner

## 2019-03-08 DIAGNOSIS — N401 Enlarged prostate with lower urinary tract symptoms: Secondary | ICD-10-CM

## 2019-03-08 DIAGNOSIS — I10 Essential (primary) hypertension: Secondary | ICD-10-CM

## 2019-03-08 DIAGNOSIS — M546 Pain in thoracic spine: Secondary | ICD-10-CM | POA: Diagnosis not present

## 2019-03-08 DIAGNOSIS — I82409 Acute embolism and thrombosis of unspecified deep veins of unspecified lower extremity: Secondary | ICD-10-CM | POA: Diagnosis not present

## 2019-03-08 DIAGNOSIS — R3911 Hesitancy of micturition: Secondary | ICD-10-CM

## 2019-03-08 DIAGNOSIS — F410 Panic disorder [episodic paroxysmal anxiety] without agoraphobia: Secondary | ICD-10-CM

## 2019-03-08 DIAGNOSIS — J301 Allergic rhinitis due to pollen: Secondary | ICD-10-CM

## 2019-03-08 MED ORDER — ENALAPRIL-HYDROCHLOROTHIAZIDE 5-12.5 MG PO TABS: 1.0000 | ORAL_TABLET | Freq: Every day | ORAL | 2 refills | Status: DC

## 2019-03-08 MED ORDER — LORATADINE 10 MG PO TABS: 10.0000 mg | ORAL_TABLET | Freq: Every day | ORAL | 2 refills | Status: DC

## 2019-03-08 NOTE — Progress Notes (Signed)
Dry cough x 2 months 

## 2019-03-08 NOTE — Progress Notes (Signed)
Virtual Visit via Telephone Note Due to national recommendations of social distancing due to [place] 19, telehealth visit is felt to be most appropriate for this patient at this time.  I discussed the limitations, risks, security and privacy concerns of performing an evaluation and management service by telephone and the availability of in person appointments. I also discussed with the patient that there may be a patient responsible charge related to this service. The patient expressed understanding and agreed to proceed.    I connected with Sheppard Coil on 03/08/19  at   9:30 AM EDT  EDT by telephone and verified that I am speaking with the correct person using two identifiers.   Consent I discussed the limitations, risks, security and privacy concerns of performing an evaluation and management service by telephone and the availability of in person appointments. I also discussed with the patient that there may be a patient responsible charge related to this service. The patient expressed understanding and agreed to proceed.   Location of Patient: Private Residence   Location of Provider: [place] and [place] participating in Telemedicine visit: Geryl Rankins FNP-BC [place]    History of Present Illness: Telemedicine visit for: Establish care and Hospital Follow up  has a past medical history of Arthritis, BPH with obstruction/lower urinary tract symptoms, DVT (deep venous thrombosis) ([place]) (2016), GERD (gastroesophageal reflux disease), Hyperlipidemia, Hypertension, and Panic attacks.   DVT/HOSPITAL FOLLOW UP History of reocurring DVT. 1. LLE DVT 2016 was on anticoagulation for one year and then ws discontinued by PCP. Has never been evaluated by a hematologist. 2. LUE DVT 02-22-2019 Both DVTs have been unprovoked. Started on Xarelto starter pack in the ED a few weeks ago. Will need lifelong anticoagulation. Will need to be  evaluated by Heme/onc. He does endorse persistent cough and midline thoracic back pain for 2 months. Will obtain D Dimer. Scheduled for Oncology on 03-13-2019 for recurring DVT.   Essential Hypertension Well controlled. Taking Enalapril-HCTZ 5-12.5 mg daily. Denies shortness of breath, palpitations, lightheadedness, dizziness, headaches or BLE edema.  BP Readings from Last 3 Encounters:  02/22/19 121/76  07/03/15 120/77  06/09/06 122/80    BPH with LUTS Taking tamsulosin: Endorses some urinary Hesitancy. Not severe.  Needs PSA. Last colonoscopy. NO polyps  Panic Attacks Takes prn Xanax for panic attacks 0.25 mg tablet prn. Prescribed 6-7 years ago. Takes once or twice a year. I explained to him if he needed it more frequently such as monthly he would need to be referred to psychiatry . He verbalized agreement.   Past Medical History:  Diagnosis Date  . Arthritis   . BPH with obstruction/lower urinary tract symptoms   . DVT (deep venous thrombosis) ([place]) 2016   LLE  . GERD (gastroesophageal reflux disease)   . Hyperlipidemia   . Hypertension   . Panic attacks     No past surgical history on file.  Family History  Problem Relation Age of Onset  . Heart failure Mother   . Hypertension Father   . Cancer Brother     Social History   Socioeconomic History  . Marital status: Married    Spouse name: Frutoso Dimare  . Number of children: 3  . Years of education: Not on file  . Highest education level: Not on file  Occupational History  . Not on file  Social Needs  . Financial resource strain: Not on file  . Food insecurity  Worry: Not on file    Inability: Not on file  . Transportation needs    Medical: Not on file    Non-medical: Not on file  Tobacco Use  . Smoking status: Never Smoker  . Smokeless tobacco: Never Used  Substance and Sexual Activity  . Alcohol use: No  . Drug use: Not Currently  . Sexual activity: Not on file  Lifestyle  . Physical activity    Days  per week: 0 days    Minutes per session: 0 min  . Stress: Very much  Relationships  . Social Musicianconnections    Talks on phone: Twice a week    Gets together: Once a week    Attends religious service: More than 4 times per year    Active member of club or organization: Yes    Attends meetings of clubs or organizations: More than 4 times per year    Relationship status: Married  Other Topics Concern  . Not on file  Social History Narrative  . Not on file     Observations/Objective: Awake, alert and oriented x 3   Review of Systems  Constitutional: Negative for fever, malaise/fatigue and weight loss.  HENT: Negative.  Negative for nosebleeds.   Eyes: Negative.  Negative for blurred vision, double vision and photophobia.  Respiratory: Positive for cough. Negative for shortness of breath.   Cardiovascular: Negative.  Negative for chest pain, palpitations and leg swelling.       Chest discomfort  Gastrointestinal: Negative.  Negative for heartburn, nausea and vomiting.  Genitourinary: Negative for dysuria, flank pain, frequency, hematuria and urgency.       BPH with urinary hesitancy  Musculoskeletal: Positive for back pain and joint pain. Negative for myalgias.  Neurological: Negative.  Negative for dizziness, focal weakness, seizures and headaches.  Endo/Heme/Allergies: Positive for environmental allergies.  Psychiatric/Behavioral: Negative for suicidal ideas. The patient is nervous/anxious.        Panic attacks    Assessment and Plan: Diagnoses and all orders for this visit:  Recurrent deep vein thrombosis (DVT) (HCC) Continue xarelto as prescribed.  Follow up with hematology as instructed.  Urinary hesitancy due to benign prostatic hyperplasia -     PSA  Acute midline thoracic back pain -     D-dimer, quantitative ([facility])  Essential hypertension -     Enalapril-hydroCHLOROthiazide 5-12.5 MG tablet; Take 1 tablet by mouth daily. -     Lipid panel Continue all  antihypertensives as prescribed.  Remember to bring in your blood pressure log with you for your follow up appointment.  DASH/Mediterranean Diets are healthier choices for HTN.    Panic attacks Takes xanax sparingly. Patient I aware I will not be able to refill if he requires more frequent use   Allergic rhinitis due to pollen, unspecified seasonality -     loratadine (CLARITIN) 10 MG tablet; Take 1 tablet (10 mg total) by mouth daily.     Follow Up Instructions Return in about 4 weeks (around 04/05/2019) for needs labs asap; see me in 4 weeks.     I discussed the assessment and treatment plan with the patient. The patient was provided an opportunity to ask questions and all were answered. The patient agreed with the plan and demonstrated an understanding of the instructions.   The patient was advised to call back or seek an in-person evaluation if the symptoms worsen or if the condition fails to improve as anticipated.  I provided 28 minutes of non-face-to-face  time during this encounter including median intraservice time, reviewing previous notes, labs, imaging, medications and explaining diagnosis and management.  Claiborne RiggZelda W Kuper Rennels, FNP-BC

## 2019-03-10 ENCOUNTER — Encounter: Payer: Self-pay | Admitting: Nurse Practitioner

## 2019-03-11 ENCOUNTER — Ambulatory Visit: Payer: PRIVATE HEALTH INSURANCE | Attending: Nurse Practitioner

## 2019-03-11 ENCOUNTER — Other Ambulatory Visit: Payer: Self-pay

## 2019-03-12 ENCOUNTER — Encounter (HOSPITAL_COMMUNITY): Payer: Self-pay | Admitting: *Deleted

## 2019-03-12 LAB — LIPID PANEL
Chol/HDL Ratio: 3.8 ratio (ref 0.0–5.0)
Cholesterol, Total: 167 mg/dL (ref 100–199)
HDL: 44 mg/dL (ref >39)
LDL Calculated: 105 mg/dL — ABNORMAL HIGH (ref 0–99)
Triglycerides: 88 mg/dL (ref 0–149)
VLDL Cholesterol Cal: 18 mg/dL (ref 5–40)

## 2019-03-12 LAB — D-DIMER, QUANTITATIVE: D-DIMER: 0.43 mg/L FEU (ref 0.00–0.49)

## 2019-03-12 LAB — PSA: Prostate Specific Ag, Serum: 2.2 ng/mL (ref 0.0–4.0)

## 2019-03-13 ENCOUNTER — Other Ambulatory Visit (HOSPITAL_COMMUNITY): Payer: Self-pay | Admitting: *Deleted

## 2019-03-13 ENCOUNTER — Telehealth (HOSPITAL_COMMUNITY): Payer: Self-pay | Admitting: *Deleted

## 2019-03-13 ENCOUNTER — Encounter (HOSPITAL_COMMUNITY): Payer: Self-pay | Admitting: Hematology

## 2019-03-13 ENCOUNTER — Other Ambulatory Visit: Payer: Self-pay

## 2019-03-13 ENCOUNTER — Inpatient Hospital Stay (HOSPITAL_COMMUNITY): Payer: PRIVATE HEALTH INSURANCE | Attending: Hematology | Admitting: Hematology

## 2019-03-13 VITALS — BP 132/68 | HR 75 | Temp 98.8°F | Resp 18 | Wt 207.6 lb

## 2019-03-13 DIAGNOSIS — N189 Chronic kidney disease, unspecified: Secondary | ICD-10-CM | POA: Diagnosis not present

## 2019-03-13 DIAGNOSIS — E785 Hyperlipidemia, unspecified: Secondary | ICD-10-CM | POA: Insufficient documentation

## 2019-03-13 DIAGNOSIS — I10 Essential (primary) hypertension: Secondary | ICD-10-CM | POA: Insufficient documentation

## 2019-03-13 DIAGNOSIS — M129 Arthropathy, unspecified: Secondary | ICD-10-CM | POA: Diagnosis not present

## 2019-03-13 DIAGNOSIS — I82409 Acute embolism and thrombosis of unspecified deep veins of unspecified lower extremity: Secondary | ICD-10-CM | POA: Diagnosis not present

## 2019-03-13 DIAGNOSIS — M549 Dorsalgia, unspecified: Secondary | ICD-10-CM | POA: Diagnosis not present

## 2019-03-13 DIAGNOSIS — Z806 Family history of leukemia: Secondary | ICD-10-CM | POA: Insufficient documentation

## 2019-03-13 DIAGNOSIS — K219 Gastro-esophageal reflux disease without esophagitis: Secondary | ICD-10-CM | POA: Diagnosis not present

## 2019-03-13 DIAGNOSIS — Z7901 Long term (current) use of anticoagulants: Secondary | ICD-10-CM | POA: Insufficient documentation

## 2019-03-13 DIAGNOSIS — N400 Enlarged prostate without lower urinary tract symptoms: Secondary | ICD-10-CM | POA: Insufficient documentation

## 2019-03-13 MED ORDER — RIVAROXABAN 20 MG PO TABS: 20.0000 mg | ORAL_TABLET | Freq: Every day | ORAL | 5 refills | Status: DC

## 2019-03-13 NOTE — Patient Instructions (Signed)
[facility] at [facility] Discharge Instructions  You were seen today by Dr. Katragadda. He went over your history, family history and how you've been feeling lately.  He will see you back in 6 months for labs and follow up.   Thank you for choosing [facility] at [facility] to provide your oncology and hematology care.  To afford each patient quality time with our provider, please arrive at least 15 minutes before your scheduled appointment time.   If you have a lab appointment with the Cancer Center please come in thru the  Main Entrance and check in at the main information desk  You need to re-schedule your appointment should you arrive 10 or more minutes late.  We strive to give you quality time with our providers, and arriving late affects you and other patients whose appointments are after yours.  Also, if you no show three or more times for appointments you may be dismissed from the clinic at the providers discretion.     Again, thank you for choosing [facility].  Our hope is that these requests will decrease the amount of time that you wait before being seen by our physicians.       _____________________________________________________________  Should you have questions after your visit to [facility], please contact our office at [phone] between the hours of 8:00 a.m. and 4:30 p.m.  Voicemails left after 4:00 p.m. will not be returned until the following business day.  For prescription refill requests, have your pharmacy contact our office and allow 72 hours.    Cancer Center Support Programs:   > Cancer Support Group  2nd Tuesday of the month 1pm-2pm, Journey Room    

## 2019-03-13 NOTE — Assessment & Plan Note (Addendum)
1.  Recurrent DVT: - First episode of unprovoked left leg DVT diagnosed on 07/03/2015 with DVT extending from left posterior tibial vein to common femoral vein with areas of occlusive thrombus in the profunda femoris and popliteal veins. -He was treated with 1 year of Xarelto which was subsequently discontinued.  He thought he developed this blood clot after jumping from a stage about 3 feet high.  He works as a Presenter, broadcasting. - He developed left upper extremity swelling after lifting a TV on 02/22/2019 and presented to the ER.  Ultrasound showed acute occlusive left axillary and brachial DVT with associated occlusive superficial thrombosis of the left cephalic and basilic veins. -He was started back on Xarelto and is tolerating it very well.  Pain in the left upper extremity has improved.  Most recent d-dimer on 03/11/2019 was normal. - Does not report any fevers, night sweats or weight loss in the last 6 months. -Last colonoscopy was on 01/25/2011 which was normal.  10-year follow-up was recommended. - Last PSA was 2.2 on 03/11/2019. -He did not have any bleeding issues while he was taking Xarelto for the first clot. -As he had 2 episodes of unprovoked DVT, I have recommended indefinite anticoagulation.  No further testing needed for hypercoagulable work-up.  I will see him back in 6 months for follow-up.  After that I will switch him to yearly visits to assess risk-benefit ratio of indefinite anticoagulation.  2.  Family history: - Daughter reportedly had a postpartum DVT and is on lifelong anticoagulation. -Oldest son developed a DVT while he was in the hospital for a gunshot wound for over a year. -Youngest son had also developed DVT, not clear if provoked or unprovoked. - Paternal grandfather had leukemia and a sister had leukemia.  3.  Mild CKD: - He has mildly elevated CKD since 2016. - This could be contributed by enalapril-HCTZ.  I have recommended drinking plenty of fluids and avoiding  nephrotoxins.

## 2019-03-13 NOTE — Progress Notes (Signed)
CONSULT NOTE  Patient Care Team: Claiborne Rigg, NP as PCP - General (Nurse Practitioner) Doreatha Massed, MD as Consulting Physician (Hematology)  CHIEF COMPLAINTS/PURPOSE OF CONSULTATION:  Recurrent unprovoked DVT.  HISTORY OF PRESENTING ILLNESS:  Bruce Adkins 63 y.o. male is seen in consultation today for further work-up and management of recurrent DVT. He went to the ER on 02/22/2019 with left upper extremity swelling which he thought he developed after lifting a television.  An ultrasound showed acute occlusive left axillary and brachial DVT with associated occlusive superficial thrombosis of the left cephalic and basilic veins.  He started on Xarelto and is tolerating it very well.  He does not report any pain in the left upper extremity at this time.  At the same time he also developed some back pain which was worse on deep inspiration.  That pain also improved since anticoagulation.  He denies any recent fevers, night sweats or weight loss. He has a history of left leg DVT diagnosed on 07/03/2015 when Doppler showed DVT extending from left posterior tibial vein to the common femoral vein with areas of occlusive thrombus in the profunda femoris vein and popliteal veins.  He works as a Programmer, multimedia.  He thought he developed a clot after jumping from a stage about 3 feet high.  He did not have any injuries at that time.  He received 1 year of Xarelto which was discontinued. He denies any bleeding per rectum or melena.  Colonoscopy was on 01/25/2011 which was normal.  He denies any smoking history.  Several years ago he worked at [employer] in [place]. Family history significant for daughter developing postpartum DVT.  His oldest son had DVT while he was injured and in the hospital for a year.  Younger son also had DVT.  Paternal grandfather had leukemia and sister had leukemia.  MEDICAL HISTORY:  Past Medical History:  Diagnosis Date  . Arthritis   . BPH with obstruction/lower urinary  tract symptoms   . DVT (deep venous thrombosis) (HCC) 2016   LLE  . GERD (gastroesophageal reflux disease)   . Hyperlipidemia   . Hypertension   . Panic attacks     SURGICAL HISTORY: History reviewed. No pertinent surgical history.  SOCIAL HISTORY: Social History   Socioeconomic History  . Marital status: Married    Spouse name: Kinnie Kaupp  . Number of children: 3  . Years of education: Not on file  . Highest education level: Not on file  Occupational History  . Not on file  Social Needs  . Financial resource strain: Not on file  . Food insecurity    Worry: Not on file    Inability: Not on file  . Transportation needs    Medical: Not on file    Non-medical: Not on file  Tobacco Use  . Smoking status: Never Smoker  . Smokeless tobacco: Never Used  Substance and Sexual Activity  . Alcohol use: No  . Drug use: Not Currently  . Sexual activity: Not on file  Lifestyle  . Physical activity    Days per week: 0 days    Minutes per session: 0 min  . Stress: Very much  Relationships  . Social Musician on phone: Twice a week    Gets together: Once a week    Attends religious service: More than 4 times per year    Active member of club or organization: Yes    Attends meetings of clubs or organizations: More  than 4 times per year    Relationship status: Married  . Intimate partner violence    Fear of current or ex partner: Not on file    Emotionally abused: Not on file    Physically abused: Not on file    Forced sexual activity: Not on file  Other Topics Concern  . Not on file  Social History Narrative  . Not on file    FAMILY HISTORY: Family History  Problem Relation Age of Onset  . Heart failure Mother   . Hypertension Mother   . Anxiety disorder Mother   . Hypertension Father   . Cancer Brother     ALLERGIES:  has No Known Allergies.  MEDICATIONS:  Current Outpatient Medications  Medication Sig Dispense Refill  . ALPRAZolam (XANAX)  0.25 MG tablet Take 1 tablet by mouth at bedtime.    . Enalapril-hydroCHLOROthiazide 5-12.5 MG tablet Take 1 tablet by mouth daily. 90 tablet 2  . loratadine (CLARITIN) 10 MG tablet Take 1 tablet (10 mg total) by mouth daily. 90 tablet 2  . Rivaroxaban 15 & 20 MG TBPK Take as directed on package: Start with one 15mg  tablet by mouth twice a day with food. On Day 22, switch to one 20mg  tablet once a day with food. 51 each 0  . tamsulosin (FLOMAX) 0.4 MG CAPS capsule Take 1 capsule by mouth daily.     No current facility-administered medications for this visit.     REVIEW OF SYSTEMS:   Constitutional: Denies fevers, chills or abnormal night sweats.  Positive for fatigue. Eyes: Denies blurriness of vision, double vision or watery eyes Ears, nose, mouth, throat, and face: Denies mucositis or sore throat Respiratory: Denies cough, dyspnea or wheezes Cardiovascular: Denies palpitation, chest discomfort.  Occasional left leg swelling. Gastrointestinal:  Denies nausea, heartburn or change in bowel habits Skin: Denies abnormal skin rashes Lymphatics: Denies new lymphadenopathy or easy bruising Neurological:Denies numbness, tingling or new weaknesses Behavioral/Psych: Mood is stable, no new changes  All other systems were reviewed with the patient and are negative.  PHYSICAL EXAMINATION: ECOG PERFORMANCE STATUS: 0 - Asymptomatic  Vitals:   03/13/19 1300  BP: 132/68  Pulse: 75  Resp: 18  Temp: 98.8 F (37.1 C)  SpO2: 99%   Filed Weights   03/13/19 1300  Weight: 207 lb 9.6 oz (94.2 kg)    GENERAL:alert, no distress and comfortable SKIN: skin color, texture, turgor are normal, no rashes or significant lesions EYES: normal, conjunctiva are pink and non-injected, sclera clear OROPHARYNX:no exudate, no erythema and lips, buccal mucosa, and tongue normal  NECK: supple, thyroid normal size, non-tender, without nodularity LYMPH:  no palpable lymphadenopathy in the cervical, axillary or  inguinal LUNGS: clear to auscultation and percussion with normal breathing effort HEART: regular rate & rhythm and no murmurs and no lower extremity edema.  Slightly swollen left upper extremity. ABDOMEN:abdomen soft, non-tender and normal bowel sounds Musculoskeletal:no cyanosis of digits and no clubbing  PSYCH: alert & oriented x 3 with fluent speech NEURO: no focal motor/sensory deficits  LABORATORY DATA:  I have reviewed the data as listed Recent Results (from the past 2160 hour(s))  Basic metabolic panel     Status: Abnormal   Collection Time: 02/22/19  1:24 PM  Result Value Ref Range   Sodium 138 135 - 145 mmol/L   Potassium 4.1 3.5 - 5.1 mmol/L   Chloride 104 98 - 111 mmol/L   CO2 26 22 - 32 mmol/L   Glucose, Bld 122 (H)  70 - 99 mg/dL   BUN 19 8 - 23 mg/dL   Creatinine, Ser 1.471.34 (H) 0.61 - 1.24 mg/dL   Calcium 9.0 8.9 - 82.910.3 mg/dL   GFR calc non Af Amer 56 (L) >60 mL/min   GFR calc Af Amer >60 >60 mL/min   Anion gap 8 5 - 15    Comment: Performed at [facility], [address] [place], [place] [address]  CBC with Differential     Status: None   Collection Time: 02/22/19  1:24 PM  Result Value Ref Range   WBC 4.5 4.0 - 10.5 K/uL   RBC 4.52 4.22 - 5.81 MIL/uL   Hemoglobin 13.8 13.0 - 17.0 g/dL   HCT 08.641.9 57.839.0 - 46.952.0 %   MCV 92.7 80.0 - 100.0 fL   MCH 30.5 26.0 - 34.0 pg   MCHC 32.9 30.0 - 36.0 g/dL   RDW 62.913.3 52.811.5 - 41.315.5 %   Platelets 175 150 - 400 K/uL   nRBC 0.0 0.0 - 0.2 %   Neutrophils Relative % 53 %   Neutro Abs 2.4 1.7 - 7.7 K/uL   Lymphocytes Relative 36 %   Lymphs Abs 1.6 0.7 - 4.0 K/uL   Monocytes Relative 8 %   Monocytes Absolute 0.4 0.1 - 1.0 K/uL   Eosinophils Relative 2 %   Eosinophils Absolute 0.1 0.0 - 0.5 K/uL   Basophils Relative 1 %   Basophils Absolute 0.0 0.0 - 0.1 K/uL   Immature Granulocytes 0 %   Abs Immature Granulocytes 0.01 0.00 - 0.07 K/uL    Comment: Performed at [facility], [address] [place], [place] [address]  PSA      Status: None   Collection Time: 03/11/19 10:03 AM  Result Value Ref Range   Prostate Specific Ag, Serum 2.2 0.0 - 4.0 ng/mL    Comment: Roche ECLIA methodology. According to the American Urological Association, Serum PSA should decrease and remain at undetectable levels after radical prostatectomy. The AUA defines biochemical recurrence as an initial PSA value 0.2 ng/mL or greater followed by a subsequent confirmatory PSA value 0.2 ng/mL or greater. Values obtained with different assay methods or kits cannot be used interchangeably. Results cannot be interpreted as absolute evidence of the presence or absence of malignant disease.   D-dimer, quantitative (not at [facility])     Status: None   Collection Time: 03/11/19 10:03 AM  Result Value Ref Range   D-DIMER 0.43 0.00 - 0.49 mg/L FEU    Comment: According to the assay manufacturer's published package insert, a normal (<0.50 mg/L FEU) D-dimer result in conjunction with a non-high clinical probability assessment, excludes deep vein thrombosis (DVT) and pulmonary embolism (PE) with high sensitivity. D-dimer values increase with age and this can make VTE exclusion of an older population difficult. To address this, the [employer], based on best available evidence and recent guidelines, recommends that clinicians use age-adjusted D-dimer thresholds in patients greater than 62 years of age with: a) a low probability of PE who do not meet all Pulmonary Embolism Rule Out Criteria, or b) in those with intermediate probability of PE. The formula for an age-adjusted D-dimer cut-off is "age/100". For example, a 62 year old patient would have an age-adjusted cut-off of 0.60 mg/L FEU and an 62 year old 0.80 mg/L FEU.   Lipid panel     Status: Abnormal   Collection Time: 03/11/19 10:03 AM  Result Value Ref Range   Cholesterol, Total 167 100 -  199 mg/dL   Triglycerides 88 0 - 149 mg/dL   HDL 44 >40>39 mg/dL   VLDL Cholesterol Cal  18 5 - 40 mg/dL   LDL Calculated 981105 (H) 0 - 99 mg/dL   Chol/HDL Ratio 3.8 0.0 - 5.0 ratio    Comment:                                   T. Chol/HDL Ratio                                             Men  Women                               1/2 Avg.Risk  3.4    3.3                                   Avg.Risk  5.0    4.4                                2X Avg.Risk  9.6    7.1                                3X Avg.Risk 23.4   11.0     RADIOGRAPHIC STUDIES: I have personally reviewed the radiological images as listed and agreed with the findings in the report. Dg Elbow Complete Left  Result Date: 02/22/2019 CLINICAL DATA:  Pain and redness of the left upper extremity. EXAM: LEFT ELBOW - COMPLETE 3+ VIEW COMPARISON:  None. FINDINGS: There is no evidence of fracture, dislocation, or joint effusion. There is no evidence of arthropathy or other focal bone abnormality. Soft tissues are unremarkable. IMPRESSION: Negative. Electronically Signed   By: Elige KoHetal  Patel   On: 02/22/2019 12:11   Dg Wrist Complete Left  Result Date: 02/22/2019 CLINICAL DATA:  Pain and redness extending from the left axilla to the left wrist after lifting a TV 2 weeks ago. EXAM: LEFT WRIST - COMPLETE 3+ VIEW COMPARISON:  None. FINDINGS: There is no evidence of fracture or dislocation. There is no evidence of arthropathy or other focal bone abnormality. Soft tissues are unremarkable. IMPRESSION: Negative. Electronically Signed   By: Sebastian AcheAllen  Grady M.D.   On: 02/22/2019 12:05   [place] Venous Img Upper Left (dvt Study)  Result Date: 02/22/2019 CLINICAL DATA:  Left upper extremity pain, lifting injury EXAM: LEFT UPPER EXTREMITY VENOUS DOPPLER ULTRASOUND TECHNIQUE: Gray-scale sonography with graded compression, as well as color Doppler and duplex ultrasound were performed to evaluate the upper extremity deep venous system from the level of the subclavian vein and including the jugular, axillary, basilic, radial, ulnar and upper cephalic vein.  Spectral Doppler was utilized to evaluate flow at rest and with distal augmentation maneuvers. COMPARISON:  None. FINDINGS: Contralateral Subclavian Vein: Respiratory phasicity is normal and symmetric with the symptomatic side. No evidence of thrombus. Normal compressibility. Internal Jugular Vein: No evidence of thrombus. Normal compressibility, respiratory phasicity and response to augmentation. Subclavian Vein: No evidence of thrombus. Normal compressibility, respiratory phasicity and  response to augmentation. Axillary Vein: Positive for intraluminal thrombus appearing occlusive. Vessel noncompressible. Phasic flow not demonstrated. Augmentation not performed. Cephalic Vein: Positive for intraluminal occlusive thrombus. Vessel noncompressible. Phasic flow not demonstrated. Basilic Vein: Positive for intraluminal occlusive thrombus. Vessel noncompressible. Phasic flow not demonstrated Brachial Veins: Positive for occlusive thrombus. Vessel is noncompressible. Phasic flow not demonstrated Radial Veins: No evidence of thrombus. Normal compressibility, respiratory phasicity and response to augmentation. Ulnar Veins: No evidence of thrombus. Normal compressibility, respiratory phasicity and response to augmentation. Venous Reflux:  Not assessed Other Findings:  None visualized. IMPRESSION: Positive exam for acute occlusive left axillary and brachial DVT with associated occlusive superficial thrombosis of the left cephalic and basilic veins. These results will be called to the ordering clinician or representative by the Radiologist Assistant, and communication documented in the PACS or zVision Dashboard. Electronically Signed   By: Judie Petit.  Shick M.D.   On: 02/22/2019 12:44   Dg Shoulder Left  Result Date: 02/22/2019 CLINICAL DATA:  Pain and redness extending from the left axilla to the left wrist after lifting a TV 2 weeks ago. EXAM: LEFT SHOULDER - 2+ VIEW COMPARISON:  None. FINDINGS: There is no evidence of fracture  or dislocation. There is no evidence of arthropathy or other focal bone abnormality. Soft tissues are unremarkable. IMPRESSION: Negative. Electronically Signed   By: Sebastian Ache M.D.   On: 02/22/2019 12:07    ASSESSMENT & PLAN:  Recurrent deep vein thrombosis (DVT) (HCC) 1.  Recurrent DVT: - First episode of unprovoked left leg DVT diagnosed on 07/03/2015 with DVT extending from left posterior tibial vein to common femoral vein with areas of occlusive thrombus in the profunda femoris and popliteal veins. -He was treated with 1 year of Xarelto which was subsequently discontinued.  He thought he developed this blood clot after jumping from a stage about 3 feet high.  He works as a Careers adviser. - He developed left upper extremity swelling after lifting a TV on 02/22/2019 and presented to the ER.  Ultrasound showed acute occlusive left axillary and brachial DVT with associated occlusive superficial thrombosis of the left cephalic and basilic veins. -He was started back on Xarelto and is tolerating it very well.  Pain in the left upper extremity has improved.  Most recent d-dimer on 03/11/2019 was normal. - Does not report any fevers, night sweats or weight loss in the last 6 months. -Last colonoscopy was on 01/25/2011 which was normal.  10-year follow-up was recommended. - Last PSA was 2.2 on 03/11/2019. -He did not have any bleeding issues while he was taking Xarelto for the first clot. -As he had 2 episodes of unprovoked DVT, I have recommended indefinite anticoagulation.  No further testing needed for hypercoagulable work-up.  I will see him back in 6 months for follow-up.  After that I will switch him to yearly visits to assess risk-benefit ratio of indefinite anticoagulation.  2.  Family history: - Daughter reportedly had a postpartum DVT and is on lifelong anticoagulation. -Oldest son developed a DVT while he was in the hospital for a gunshot wound for over a year. -Youngest son had also developed  DVT, not clear if provoked or unprovoked. - Paternal grandfather had leukemia and a sister had leukemia.  3.  Mild CKD: - He has mildly elevated CKD since 2016. - This could be contributed by enalapril-HCTZ.  I have recommended drinking plenty of fluids and avoiding nephrotoxins.     All questions were answered. The patient knows to call  the clinic with any problems, questions or concerns.      Doreatha MassedSreedhar Uchenna Rappaport, MD 03/13/19 5:12 PM

## 2019-03-13 NOTE — Progress Notes (Signed)
Per Dr. Delton Coombes, orders called in to pharmacy for xarelto 20 mg once daily.

## 2019-03-15 NOTE — Progress Notes (Signed)
CMA spoke to patient to inform on results.  Pt. Verified DOB. Pt. Understood.  

## 2019-04-09 ENCOUNTER — Ambulatory Visit: Payer: PRIVATE HEALTH INSURANCE | Attending: Nurse Practitioner | Admitting: Nurse Practitioner

## 2019-04-09 ENCOUNTER — Other Ambulatory Visit: Payer: Self-pay

## 2019-04-09 ENCOUNTER — Encounter: Payer: Self-pay | Admitting: Nurse Practitioner

## 2019-04-09 DIAGNOSIS — I10 Essential (primary) hypertension: Secondary | ICD-10-CM | POA: Diagnosis not present

## 2019-04-09 NOTE — Progress Notes (Signed)
Virtual Visit via Telephone Note Due to national recommendations of social distancing due to COVID 19, telehealth visit is felt to be most appropriate for this patient at this time.  I discussed the limitations, risks, security and privacy concerns of performing an evaluation and management service by telephone and the availability of in person appointments. I also discussed with the patient that there may be a patient responsible charge related to this service. The patient expressed understanding and agreed to proceed.    I connected with Bruce Adkins on 04/09/19  at   2:10 PM EDT  EDT by telephone and verified that I am speaking with the correct person using two identifiers.   Consent I discussed the limitations, risks, security and privacy concerns of performing an evaluation and management service by telephone and the availability of in person appointments. I also discussed with the patient that there may be a patient responsible charge related to this service. The patient expressed understanding and agreed to proceed.   Location of Patient: Private  Residence   [facility]    Persons participating in Telemedicine visit: Bruce Adkins    History of Present Illness: Telemedicine visit for: HTN  has a past medical history of Arthritis, BPH with obstruction/lower urinary tract symptoms, CKD (chronic kidney disease), DVT (deep venous thrombosis) (HCC) (2016), GERD (gastroesophageal reflux disease), Hyperlipidemia, Hypertension, and Panic attacks.  He is currently seeing Oncology for recurrent DVT: acute occlusive left axillary and brachial DVT with associated occlusive superficial thrombosis of the left cephalic and basilic veins.  He was started on Xarelto and is tolerating it very well.   He has a history of left leg DVT diagnosed on 07/03/2015 when Doppler showed DVT extending from left posterior  tibial vein to the common femoral vein with areas of occlusive thrombus in the profunda femoris vein and popliteal veins.  He works as a Programmer, multimediapreacher.  He received 1 year of Xarelto which was discontinued. Family history significant for daughter developing postpartum DVT.  His oldest son had DVT while he was injured and in the hospital for a year.  Younger son also had DVT.  Paternal grandfather had leukemia and sister had leukemia. As he had 2 episodes of unprovoked DVT, I have recommended indefinite anticoagulation.  No further testing needed for hypercoagulable work-up.  I will see him back in 6 months for follow-up.  After that I will switch him to yearly visits to assess risk-benefit ratio of indefinite anticoagulation.  Essential Hypertension Chronic and well controlled. Taking Enalapril-Hctz 5-12.5mg  daily as prescribed. He does not monitor his blood pressure at home but states he will check with his wife as he does have a blood pressure monitor "somewhere in the home". Denies chest pain, shortness of breath, palpitations, lightheadedness, dizziness, headaches or BLE edema.  BP Readings from Last 3 Encounters:  03/13/19 132/68  02/22/19 121/76  07/03/15 120/77     Past Medical History:  Diagnosis Date  . Arthritis   . BPH with obstruction/lower urinary tract symptoms   . CKD (chronic kidney disease)   . DVT (deep venous thrombosis) (HCC) 2016   LLE  . GERD (gastroesophageal reflux disease)   . Hyperlipidemia   . Hypertension   . Panic attacks     History reviewed. No pertinent surgical history.  Family History  Problem Relation Age of Onset  . Heart failure Mother   . Hypertension Mother   .  Anxiety disorder Mother   . Hypertension Father   . Cancer Brother     Social History   Socioeconomic History  . Marital status: Married    Spouse name: Virgie Chery  . Number of children: 3  . Years of education: Not on file  . Highest education level: Not on file  Occupational  History  . Not on file  Social Needs  . Financial resource strain: Not on file  . Food insecurity    Worry: Not on file    Inability: Not on file  . Transportation needs    Medical: Not on file    Non-medical: Not on file  Tobacco Use  . Smoking status: Never Smoker  . Smokeless tobacco: Never Used  Substance and Sexual Activity  . Alcohol use: No  . Drug use: Not Currently  . Sexual activity: Not on file  Lifestyle  . Physical activity    Days per week: 0 days    Minutes per session: 0 min  . Stress: Very much  Relationships  . Social Herbalist on phone: Twice a week    Gets together: Once a week    Attends religious service: More than 4 times per year    Active member of club or organization: Yes    Attends meetings of clubs or organizations: More than 4 times per year    Relationship status: Married  Other Topics Concern  . Not on file  Social History Narrative  . Not on file     Observations/Objective: Awake, alert and oriented x 3   Review of Systems  Constitutional: Negative for fever, malaise/fatigue and weight loss.  HENT: Negative.  Negative for nosebleeds.   Eyes: Negative.  Negative for blurred vision, double vision and photophobia.  Respiratory: Negative.  Negative for cough and shortness of breath.   Cardiovascular: Negative.  Negative for chest pain, palpitations and leg swelling.  Gastrointestinal: Negative.  Negative for heartburn, nausea and vomiting.  Musculoskeletal: Negative.  Negative for myalgias.  Neurological: Negative.  Negative for dizziness, focal weakness, seizures and headaches.  Psychiatric/Behavioral: Negative.  Negative for suicidal ideas.    Assessment and Plan: Bruce Adkins was seen today for follow-up.  Diagnoses and all orders for this visit:  Essential hypertension Continue Enalapril-HCTZ 5-12.5 mg daily as prescribed Remember to bring in your blood pressure log with you for your follow up appointment.   DASH/Mediterranean Diets are healthier choices for HTN.     Follow Up Instructions Return in about 2 months (around 06/10/2019) for HTN.     I discussed the assessment and treatment plan with the patient. The patient was provided an opportunity to ask questions and all were answered. The patient agreed with the plan and demonstrated an understanding of the instructions.   The patient was advised to call back or seek an in-person evaluation if the symptoms worsen or if the condition fails to improve as anticipated.  I provided 18 minutes of non-face-to-face time during this encounter including median intraservice time, reviewing previous notes, labs, imaging, medications and explaining diagnosis and management.  Gildardo Pounds, FNP-BC

## 2019-09-03 ENCOUNTER — Other Ambulatory Visit: Payer: Self-pay

## 2019-09-03 ENCOUNTER — Inpatient Hospital Stay (HOSPITAL_COMMUNITY): Payer: PRIVATE HEALTH INSURANCE | Attending: Hematology

## 2019-09-03 DIAGNOSIS — Z86718 Personal history of other venous thrombosis and embolism: Secondary | ICD-10-CM | POA: Diagnosis not present

## 2019-09-03 DIAGNOSIS — Z7901 Long term (current) use of anticoagulants: Secondary | ICD-10-CM | POA: Diagnosis not present

## 2019-09-03 DIAGNOSIS — I82409 Acute embolism and thrombosis of unspecified deep veins of unspecified lower extremity: Secondary | ICD-10-CM

## 2019-09-03 LAB — D-DIMER, QUANTITATIVE (NOT AT ARMC): D-Dimer, Quant: <0.27 ug/mL-FEU (ref 0.00–0.50)

## 2019-09-09 ENCOUNTER — Other Ambulatory Visit (HOSPITAL_COMMUNITY): Payer: Self-pay | Admitting: Hematology

## 2019-09-10 ENCOUNTER — Inpatient Hospital Stay (HOSPITAL_BASED_OUTPATIENT_CLINIC_OR_DEPARTMENT_OTHER): Payer: PRIVATE HEALTH INSURANCE | Admitting: Hematology

## 2019-09-10 ENCOUNTER — Encounter (HOSPITAL_COMMUNITY): Payer: Self-pay | Admitting: Hematology

## 2019-09-10 DIAGNOSIS — Z7901 Long term (current) use of anticoagulants: Secondary | ICD-10-CM

## 2019-09-10 DIAGNOSIS — I82409 Acute embolism and thrombosis of unspecified deep veins of unspecified lower extremity: Secondary | ICD-10-CM

## 2019-09-10 DIAGNOSIS — I82622 Acute embolism and thrombosis of deep veins of left upper extremity: Secondary | ICD-10-CM

## 2019-09-10 NOTE — Progress Notes (Signed)
Virtual Visit via Telephone Note  I connected with Bruce Adkins on 09/10/19 at  2:20 PM EST by telephone and verified that I am speaking with the correct person using two identifiers.   I discussed the limitations, risks, security and privacy concerns of performing an evaluation and management service by telephone and the availability of in person appointments. I also discussed with the patient that there may be a patient responsible charge related to this service. The patient expressed understanding and agreed to proceed.   History of Present Illness: He is being followed up for 2 episodes of unprovoked DVT in our clinic.  Last episode was on 07/03/2015 when he had unprovoked left leg DVT extending from the left posterior tibial vein to left common femoral vein with areas of occlusive thrombus in the profunda femoris and popliteal veins.  He was treated with 1 year of anticoagulation with Xarelto.  He then developed swelling in the left upper extremity on 02/22/2019 and ultrasound showed left axillary and brachial DVT.  He was started back on Xarelto.   Observations/Objective: He reports that he has been tolerating Xarelto very well.  He gets on and off tightness and swelling in the left leg for the last 2 weeks.  He has not been wearing compression stockings on a daily basis.  He denies any nosebleeds, easy bruising, bleeding per rectum or hematuria.  He is not missing any doses of Xarelto.  Denies any fevers, night sweats or weight loss in the last 6 months.  Assessment and Plan:  1.  Recurrent DVT: -Based on 2 episodes of unprovoked DVT, he was recommended indefinite anticoagulation. -We reviewed D-dimer which was normal. -We will continue to assess risk-benefit ratio once a year.  He will be followed up in a year with repeat D-dimer. -He was instructed to wear the compression stockings on a daily basis.  He was inquiring about any other devices which would help control his swelling.  He was  asked to call [employer] and see what options he has.   Follow Up Instructions: RTC 1 year for follow-up.   I discussed the assessment and treatment plan with the patient. The patient was provided an opportunity to ask questions and all were answered. The patient agreed with the plan and demonstrated an understanding of the instructions.   The patient was advised to call back or seek an in-person evaluation if the symptoms worsen or if the condition fails to improve as anticipated.  I provided 13 minutes of non-face-to-face time during this encounter.   Derek Jack, MD

## 2019-09-26 ENCOUNTER — Ambulatory Visit: Payer: PRIVATE HEALTH INSURANCE | Attending: Internal Medicine

## 2019-09-26 ENCOUNTER — Other Ambulatory Visit: Payer: Self-pay

## 2019-09-26 ENCOUNTER — Other Ambulatory Visit: Payer: PRIVATE HEALTH INSURANCE

## 2019-09-26 DIAGNOSIS — Z20822 Contact with and (suspected) exposure to covid-19: Secondary | ICD-10-CM

## 2019-09-28 LAB — NOVEL CORONAVIRUS, NAA: SARS-CoV-2, NAA: NOT DETECTED

## 2019-12-07 ENCOUNTER — Ambulatory Visit: Payer: PRIVATE HEALTH INSURANCE

## 2019-12-07 ENCOUNTER — Other Ambulatory Visit: Payer: Self-pay

## 2019-12-07 ENCOUNTER — Ambulatory Visit: Payer: Self-pay | Attending: Internal Medicine

## 2019-12-07 DIAGNOSIS — Z23 Encounter for immunization: Secondary | ICD-10-CM

## 2019-12-07 NOTE — Progress Notes (Signed)
   Covid-19 Vaccination Clinic  Name:  Bruce Adkins    MRN: [id number] DOB: 1956-06-10  12/07/2019  Mr. Bruce Adkins was observed post Covid-19 immunization for 15 minutes without incident. He was provided with Vaccine Information Sheet and instruction to access the V-Safe system.   Mr. Bruce Adkins was instructed to call 911 with any severe reactions post vaccine: Marland Kitchen Difficulty breathing  . Swelling of face and throat  . A fast heartbeat  . A bad rash all over body  . Dizziness and weakness   Immunizations Administered    Name Date Dose VIS Date Route   Moderna COVID-19 Vaccine 12/07/2019 10:24 AM 0.5 mL 08/27/2019 Intramuscular   Manufacturer: Moderna   Lot: [id number]   NDC: 15953-967-28

## 2019-12-21 ENCOUNTER — Other Ambulatory Visit: Payer: Self-pay | Admitting: Nurse Practitioner

## 2019-12-21 DIAGNOSIS — I10 Essential (primary) hypertension: Secondary | ICD-10-CM

## 2020-01-08 ENCOUNTER — Ambulatory Visit: Payer: Self-pay | Attending: Internal Medicine

## 2020-01-08 DIAGNOSIS — Z23 Encounter for immunization: Secondary | ICD-10-CM

## 2020-01-08 NOTE — Progress Notes (Signed)
   Covid-19 Vaccination Clinic  Name:  Bruce Adkins    MRN: [id number] DOB: [birth year]  01/08/2020  [name] was observed post Covid-19 immunization for 15 minutes without incident. He was provided with Vaccine Information Sheet and instruction to access the V-Safe system.   [name] was instructed to call 911 with any severe reactions post vaccine: Marland Kitchen Difficulty breathing  . Swelling of face and throat  . A fast heartbeat  . A bad rash all over body  . Dizziness and weakness   Immunizations Administered    Name Date Dose VIS Date Route   Moderna COVID-19 Vaccine 01/08/2020  9:12 AM 0.5 mL 08/27/2019 Intramuscular   Manufacturer: Moderna   Lot: [id number]   NDC: 58063-868-54

## 2020-02-29 ENCOUNTER — Other Ambulatory Visit (HOSPITAL_COMMUNITY): Payer: Self-pay | Admitting: Hematology

## 2020-03-16 ENCOUNTER — Encounter (HOSPITAL_COMMUNITY): Payer: Self-pay

## 2020-03-16 ENCOUNTER — Encounter: Payer: Self-pay | Admitting: Nurse Practitioner

## 2020-03-24 ENCOUNTER — Other Ambulatory Visit: Payer: Self-pay | Admitting: Family Medicine

## 2020-03-24 DIAGNOSIS — I10 Essential (primary) hypertension: Secondary | ICD-10-CM

## 2020-03-25 ENCOUNTER — Encounter: Payer: Self-pay | Admitting: Nurse Practitioner

## 2020-03-26 ENCOUNTER — Other Ambulatory Visit: Payer: Self-pay | Admitting: Nurse Practitioner

## 2020-03-26 MED ORDER — TAMSULOSIN HCL 0.4 MG PO CAPS: 0.4000 mg | ORAL_CAPSULE | Freq: Every day | ORAL | 1 refills | Status: DC

## 2020-03-26 MED FILL — TAMSULOSIN HCL 0.4 MG CAP: 0.4 | 30 days supply | Qty: 30 | Fill #0

## 2020-04-05 ENCOUNTER — Other Ambulatory Visit: Payer: Self-pay | Admitting: Nurse Practitioner

## 2020-04-05 DIAGNOSIS — I10 Essential (primary) hypertension: Secondary | ICD-10-CM

## 2020-04-06 MED ORDER — ENALAPRIL-HYDROCHLOROTHIAZIDE 5-12.5 MG PO TABS: 1.0000 | ORAL_TABLET | Freq: Every day | ORAL | 0 refills | Status: DC

## 2020-04-06 MED FILL — ENALAPRIL-HCTZ 5-12.5 MG TA: 5-12.5 | 30 days supply | Qty: 30 | Fill #0

## 2020-04-06 MED FILL — XARELTO 20 MG TABLET: 20 | 30 days supply | Qty: 30 | Fill #1

## 2020-04-06 NOTE — Telephone Encounter (Signed)
Please fill if appropriate.  

## 2020-04-21 ENCOUNTER — Other Ambulatory Visit (HOSPITAL_COMMUNITY): Payer: Self-pay | Admitting: Family Medicine

## 2020-04-21 DIAGNOSIS — E785 Hyperlipidemia, unspecified: Secondary | ICD-10-CM | POA: Diagnosis not present

## 2020-04-21 DIAGNOSIS — I10 Essential (primary) hypertension: Secondary | ICD-10-CM | POA: Diagnosis not present

## 2020-04-21 DIAGNOSIS — E789 Disorder of lipoprotein metabolism, unspecified: Secondary | ICD-10-CM | POA: Diagnosis not present

## 2020-04-21 DIAGNOSIS — Z6829 Body mass index (BMI) 29.0-29.9, adult: Secondary | ICD-10-CM | POA: Diagnosis not present

## 2020-04-21 MED FILL — ALPRAZolam 0.25 MG TABS: 0.25 | 30 days supply | Qty: 30 | Fill #0

## 2020-04-21 MED FILL — TAMSULOSIN HCL 0.4 MG CAP: 0.4 | 90 days supply | Qty: 90 | Fill #0

## 2020-04-21 MED FILL — ATORVASTATIN 20 MG TABLET: 20 | 90 days supply | Qty: 90 | Fill #0

## 2020-04-30 MED FILL — XARELTO 20 MG TABLET: 20 | 30 days supply | Qty: 30 | Fill #0

## 2020-04-30 MED FILL — ENALAPRIL-HCTZ 5-12.5 MG TA: 5-12.5 | 90 days supply | Qty: 90 | Fill #0

## 2020-06-08 MED FILL — XARELTO 20 MG TABLET: 20 | 30 days supply | Qty: 30 | Fill #1

## 2020-06-20 DIAGNOSIS — H524 Presbyopia: Secondary | ICD-10-CM | POA: Diagnosis not present

## 2020-07-06 MED FILL — XARELTO 20 MG TABLET: 20 | 30 days supply | Qty: 30 | Fill #2

## 2020-07-27 DIAGNOSIS — F411 Generalized anxiety disorder: Secondary | ICD-10-CM | POA: Diagnosis not present

## 2020-07-27 DIAGNOSIS — E789 Disorder of lipoprotein metabolism, unspecified: Secondary | ICD-10-CM | POA: Diagnosis not present

## 2020-07-27 DIAGNOSIS — E785 Hyperlipidemia, unspecified: Secondary | ICD-10-CM | POA: Diagnosis not present

## 2020-07-27 DIAGNOSIS — I10 Essential (primary) hypertension: Secondary | ICD-10-CM | POA: Diagnosis not present

## 2020-07-28 MED FILL — TAMSULOSIN HCL 0.4 MG CAP: 0.4 | 90 days supply | Qty: 90 | Fill #1

## 2020-08-06 MED FILL — XARELTO 20 MG TABLET: 20 | 30 days supply | Qty: 30 | Fill #3

## 2020-08-06 MED FILL — ENALAPRIL-HCTZ 5-12.5 MG TA: 5-12.5 | 90 days supply | Qty: 90 | Fill #1

## 2020-08-25 DIAGNOSIS — Z125 Encounter for screening for malignant neoplasm of prostate: Secondary | ICD-10-CM | POA: Diagnosis not present

## 2020-08-25 DIAGNOSIS — N400 Enlarged prostate without lower urinary tract symptoms: Secondary | ICD-10-CM | POA: Diagnosis not present

## 2020-08-25 DIAGNOSIS — I10 Essential (primary) hypertension: Secondary | ICD-10-CM | POA: Diagnosis not present

## 2020-08-25 DIAGNOSIS — E785 Hyperlipidemia, unspecified: Secondary | ICD-10-CM | POA: Diagnosis not present

## 2020-08-27 ENCOUNTER — Other Ambulatory Visit (HOSPITAL_COMMUNITY): Payer: Self-pay | Admitting: Family Medicine

## 2020-08-27 MED FILL — ATORVASTATIN 40 MG TABLET: 40 | 90 days supply | Qty: 90 | Fill #0

## 2020-09-01 ENCOUNTER — Inpatient Hospital Stay (HOSPITAL_COMMUNITY): Payer: 59 | Attending: Hematology and Oncology

## 2020-09-01 DIAGNOSIS — M129 Arthropathy, unspecified: Secondary | ICD-10-CM | POA: Insufficient documentation

## 2020-09-01 DIAGNOSIS — Z86718 Personal history of other venous thrombosis and embolism: Secondary | ICD-10-CM | POA: Insufficient documentation

## 2020-09-01 DIAGNOSIS — N400 Enlarged prostate without lower urinary tract symptoms: Secondary | ICD-10-CM | POA: Insufficient documentation

## 2020-09-01 DIAGNOSIS — N183 Chronic kidney disease, stage 3 unspecified: Secondary | ICD-10-CM | POA: Insufficient documentation

## 2020-09-01 DIAGNOSIS — K219 Gastro-esophageal reflux disease without esophagitis: Secondary | ICD-10-CM | POA: Insufficient documentation

## 2020-09-01 DIAGNOSIS — F410 Panic disorder [episodic paroxysmal anxiety] without agoraphobia: Secondary | ICD-10-CM | POA: Insufficient documentation

## 2020-09-01 DIAGNOSIS — R5383 Other fatigue: Secondary | ICD-10-CM | POA: Insufficient documentation

## 2020-09-01 DIAGNOSIS — I129 Hypertensive chronic kidney disease with stage 1 through stage 4 chronic kidney disease, or unspecified chronic kidney disease: Secondary | ICD-10-CM | POA: Insufficient documentation

## 2020-09-01 DIAGNOSIS — Z79899 Other long term (current) drug therapy: Secondary | ICD-10-CM | POA: Insufficient documentation

## 2020-09-01 DIAGNOSIS — Z7901 Long term (current) use of anticoagulants: Secondary | ICD-10-CM | POA: Insufficient documentation

## 2020-09-01 DIAGNOSIS — E785 Hyperlipidemia, unspecified: Secondary | ICD-10-CM | POA: Insufficient documentation

## 2020-09-03 MED FILL — XARELTO 20 MG TABLET: 20 | 30 days supply | Qty: 30 | Fill #2

## 2020-09-04 ENCOUNTER — Inpatient Hospital Stay (HOSPITAL_COMMUNITY): Payer: 59

## 2020-09-04 ENCOUNTER — Other Ambulatory Visit: Payer: Self-pay

## 2020-09-04 DIAGNOSIS — I82409 Acute embolism and thrombosis of unspecified deep veins of unspecified lower extremity: Secondary | ICD-10-CM

## 2020-09-04 DIAGNOSIS — I129 Hypertensive chronic kidney disease with stage 1 through stage 4 chronic kidney disease, or unspecified chronic kidney disease: Secondary | ICD-10-CM | POA: Diagnosis not present

## 2020-09-04 DIAGNOSIS — F410 Panic disorder [episodic paroxysmal anxiety] without agoraphobia: Secondary | ICD-10-CM | POA: Diagnosis not present

## 2020-09-04 DIAGNOSIS — Z7901 Long term (current) use of anticoagulants: Secondary | ICD-10-CM | POA: Diagnosis not present

## 2020-09-04 DIAGNOSIS — Z79899 Other long term (current) drug therapy: Secondary | ICD-10-CM | POA: Diagnosis not present

## 2020-09-04 DIAGNOSIS — E785 Hyperlipidemia, unspecified: Secondary | ICD-10-CM | POA: Diagnosis not present

## 2020-09-04 DIAGNOSIS — N183 Chronic kidney disease, stage 3 unspecified: Secondary | ICD-10-CM | POA: Diagnosis not present

## 2020-09-04 DIAGNOSIS — Z86718 Personal history of other venous thrombosis and embolism: Secondary | ICD-10-CM | POA: Diagnosis not present

## 2020-09-04 DIAGNOSIS — K219 Gastro-esophageal reflux disease without esophagitis: Secondary | ICD-10-CM | POA: Diagnosis not present

## 2020-09-04 DIAGNOSIS — N400 Enlarged prostate without lower urinary tract symptoms: Secondary | ICD-10-CM | POA: Diagnosis not present

## 2020-09-04 DIAGNOSIS — M129 Arthropathy, unspecified: Secondary | ICD-10-CM | POA: Diagnosis not present

## 2020-09-04 DIAGNOSIS — R5383 Other fatigue: Secondary | ICD-10-CM | POA: Diagnosis not present

## 2020-09-04 LAB — CBC WITH DIFFERENTIAL/PLATELET
Abs Immature Granulocytes: 0 K/uL (ref 0.00–0.07)
Basophils Absolute: 0 K/uL (ref 0.0–0.1)
Basophils Relative: 1 %
Eosinophils Absolute: 0.1 K/uL (ref 0.0–0.5)
Eosinophils Relative: 2 %
HCT: 38.6 % — ABNORMAL LOW (ref 39.0–52.0)
Hemoglobin: 12.6 g/dL — ABNORMAL LOW (ref 13.0–17.0)
Immature Granulocytes: 0 %
Lymphocytes Relative: 37 %
Lymphs Abs: 1.8 K/uL (ref 0.7–4.0)
MCH: 31.3 pg (ref 26.0–34.0)
MCHC: 32.6 g/dL (ref 30.0–36.0)
MCV: 96 fL (ref 80.0–100.0)
Monocytes Absolute: 0.4 K/uL (ref 0.1–1.0)
Monocytes Relative: 8 %
Neutro Abs: 2.4 K/uL (ref 1.7–7.7)
Neutrophils Relative %: 52 %
Platelets: 194 K/uL (ref 150–400)
RBC: 4.02 MIL/uL — ABNORMAL LOW (ref 4.22–5.81)
RDW: 13 % (ref 11.5–15.5)
WBC: 4.7 K/uL (ref 4.0–10.5)
nRBC: 0 % (ref 0.0–0.2)

## 2020-09-04 LAB — COMPREHENSIVE METABOLIC PANEL
ALT: 20 U/L (ref 0–44)
AST: 23 U/L (ref 15–41)
Albumin: 4.3 g/dL (ref 3.5–5.0)
Alkaline Phosphatase: 70 U/L (ref 38–126)
Anion gap: 10 (ref 5–15)
BUN: 20 mg/dL (ref 8–23)
CO2: 25 mmol/L (ref 22–32)
Calcium: 9 mg/dL (ref 8.9–10.3)
Chloride: 102 mmol/L (ref 98–111)
Creatinine, Ser: 1.99 mg/dL — ABNORMAL HIGH (ref 0.61–1.24)
GFR, Estimated: 37 mL/min — ABNORMAL LOW (ref >60)
Glucose, Bld: 146 mg/dL — ABNORMAL HIGH (ref 70–99)
Potassium: 4.2 mmol/L (ref 3.5–5.1)
Sodium: 137 mmol/L (ref 135–145)
Total Bilirubin: 0.5 mg/dL (ref 0.3–1.2)
Total Protein: 7.8 g/dL (ref 6.5–8.1)

## 2020-09-04 LAB — D-DIMER, QUANTITATIVE: D-Dimer, Quant: <0.27 ug/mL-FEU (ref 0.00–0.50)

## 2020-09-07 NOTE — Progress Notes (Signed)
[facility]   [phone] [phone]   Clinic Follow up Note   Patient Care Team: Claiborne Rigg, NP as PCP - General (Nurse Practitioner) Doreatha Massed, MD as Consulting Physician (Hematology)  Date of Service:  09/08/2020  CHIEF COMPLAINT: F/u of recurrent DVT  CURRENT THERAPY:  Oral Xarelto daily, indefinitely.   INTERVAL HISTORY:  Bruce Adkins is here for a follow up of recurrent DVT. He is under the care of Dr Ellin Saba and is seeing me today in interim. He was last seen in 08/2019.  He presents to the clinic by himself.  He is still in shock due to his grandson's death last night, who was involved in a robbery.   He is overall doing well clinically, has mild fatigue, able to function well.  He denies any new leg edema or pain, no dyspnea or chest pain.  He has been compliant with Xarelto.  He does notice easy bruising, no significant bleeding episodes.  His medication has not changed in the past year, except for his cholesterol medicine.   All other systems were reviewed with the patient and are negative.  MEDICAL HISTORY:  Past Medical History:  Diagnosis Date  . Arthritis   . BPH with obstruction/lower urinary tract symptoms   . CKD (chronic kidney disease)   . DVT (deep venous thrombosis) (HCC) 2016   LLE  . GERD (gastroesophageal reflux disease)   . Hyperlipidemia   . Hypertension   . Panic attacks     SURGICAL HISTORY: History reviewed. No pertinent surgical history.  I have reviewed the social history and family history with the patient and they are unchanged from previous note.  ALLERGIES:  has No Known Allergies.  MEDICATIONS:  Current Outpatient Medications  Medication Sig Dispense Refill  . ALPRAZolam (XANAX) 0.25 MG tablet Take 1 tablet by mouth at bedtime.    Marland Kitchen apixaban (ELIQUIS) 5 MG TABS tablet Take 1 tablet (5 mg total) by mouth 2 (two) times daily. 60 tablet 0  . atorvastatin (LIPITOR) 40 MG tablet  Take 40 mg by mouth at bedtime.    . Enalapril-hydroCHLOROthiazide 5-12.5 MG tablet Take 1 tablet by mouth daily. 30 tablet 0  . loratadine (CLARITIN) 10 MG tablet Take 1 tablet (10 mg total) by mouth daily. 90 tablet 2  . tamsulosin (FLOMAX) 0.4 MG CAPS capsule Take 1 capsule (0.4 mg total) by mouth daily. 30 capsule 1   No current facility-administered medications for this visit.    PHYSICAL EXAMINATION: ECOG PERFORMANCE STATUS: 1 - Symptomatic but completely ambulatory  Vitals:   09/08/20 1431  BP: 114/75  Pulse: 94  Resp: 16  SpO2: 98%   Filed Weights   09/08/20 1431  Weight: 205 lb 3.2 oz (93.1 kg)    GENERAL:alert, no distress and comfortable SKIN: skin color, texture, turgor are normal, no rashes or significant lesions EYES: normal, Conjunctiva are pink and non-injected, sclera clear NECK: supple, thyroid normal size, non-tender, without nodularity LYMPH:  no palpable lymphadenopathy in the cervical, axillary  LUNGS: clear to auscultation and percussion with normal breathing effort HEART: regular rate & rhythm and no murmurs and no lower extremity edema ABDOMEN:abdomen soft, non-tender and normal bowel sounds Musculoskeletal:no cyanosis of digits and no clubbing, no leg edema. NEURO: alert & oriented x 3 with fluent speech, no focal motor/sensory deficits  LABORATORY DATA:  I have reviewed the data as listed CBC Latest Ref Rng & Units 09/04/2020 02/22/2019 07/03/2015  WBC 4.0 - 10.5  K/uL 4.7 4.5 5.0  Hemoglobin 13.0 - 17.0 g/dL 12.6(L) 13.8 12.6(L)  Hematocrit 39.0 - 52.0 % 38.6(L) 41.9 37.2(L)  Platelets 150 - 400 K/uL 194 175 190     CMP Latest Ref Rng & Units 09/04/2020 02/22/2019 07/03/2015  Glucose 70 - 99 mg/dL 254(Y) 706(C) 92  BUN 8 - 23 mg/dL 20 19 17   Creatinine 0.61 - 1.24 mg/dL ) 3.76(E) 8.31(D)  Sodium 135 - 145 mmol/L 137 138 140  Potassium 3.5 - 5.1 mmol/L 4.2 4.1 4.3  Chloride 98 - 111 mmol/L 102 104 105  CO2 22 - 32 mmol/L 25 26 28    Calcium 8.9 - 10.3 mg/dL 9.0 9.0 1.76(H)  Total Protein 6.5 - 8.1 g/dL 7.8 - -  Total Bilirubin 0.3 - 1.2 mg/dL 0.5 - -  Alkaline Phos 38 - 126 U/L 70 - -  AST 15 - 41 U/L 23 - -  ALT 0 - 44 U/L 20 - -      RADIOGRAPHIC STUDIES: I have personally reviewed the radiological images as listed and agreed with the findings in the report. No results found.   ASSESSMENT & PLAN:  Bruce Adkins is a 64 y.o. male with   1.    History recurrent DVT, on long-term anticoagulation -Last episode was on 07/03/2015 when he had unprovoked left leg DVT extending from the left posterior tibial vein to left common femoral vein with areas of occlusive thrombus in the profunda femoris and popliteal veins.  He was treated with 1 year of anticoagulation with Xarelto.  -He then developed swelling in the left upper extremity on 02/22/2019 and ultrasound showed left axillary and brachial DVT.  He was started back on Xarelto.  -Based on 2 episodes of unprovoked DVT, he was recommended indefinite anticoagulation. -We will continue to assess risk-benefit ratio once a year.  He will be followed up in a year with repeat D-dimer. -Dr 09/02/2015 has counseled him on risk prevention with compression stockings on a daily basis.  -He is clinically doing well, no signs of recurrent thrombosis.  Lab reviewed from last week, his creatinine has increased to 1.99, calculated GFR was 37.  I discussed his renal function may not be adequate for Xarelto, and I recommend him to change to Eliquis 5 mg every 12 hours, which is safer for patients with kidney disease.  Since his GFR is above 30, no adequate dose adjustment is needed.  We again reviewed potential side effects, especially increased risk of bleeding, she knows to avoid any injury.  -I called into [facility] pharmacy, he will pick up tomorrow. -We will monitor his lab closely  2.  Hypertension and CKD stage III -I reviewed his medication, he is on hydrochlorothiazide, which may  contribute to his renal function -We will copy his primary care physician Dr. He will, to see if we can stop hydrochlorothiazide, and switch to something else if needed. -Repeat BMP in 3 months   PLAN:  -Due to his worsening renal function, I recommend changing Xarelto to Eliquis 5 mg twice daily, new prescription called into [facility] pharmacy today -Follow-up in 3 in 6 months, follow-up with Dr[facility] in 49-month    No problem-specific Assessment & Plan notes found for this encounter.   No orders of the defined types were placed in this encounter.  All questions were answered. The patient knows to call the clinic with any problems, questions or concerns. No barriers to learning was detected. The total time spent in the appointment  was 25 minutes.     Malachy Mood, MD 09/08/2020   I, Delphina Cahill, am acting as scribe for Malachy Mood, MD.   I have reviewed the above documentation for accuracy and completeness, and I agree with the above.

## 2020-09-08 ENCOUNTER — Encounter (HOSPITAL_COMMUNITY): Payer: Self-pay | Admitting: Hematology

## 2020-09-08 ENCOUNTER — Inpatient Hospital Stay (HOSPITAL_BASED_OUTPATIENT_CLINIC_OR_DEPARTMENT_OTHER): Payer: 59 | Admitting: Hematology

## 2020-09-08 ENCOUNTER — Other Ambulatory Visit (HOSPITAL_COMMUNITY): Payer: Self-pay | Admitting: Hematology

## 2020-09-08 ENCOUNTER — Other Ambulatory Visit: Payer: Self-pay

## 2020-09-08 VITALS — BP 114/75 | HR 94 | Resp 16 | Wt 205.2 lb

## 2020-09-08 DIAGNOSIS — I82409 Acute embolism and thrombosis of unspecified deep veins of unspecified lower extremity: Secondary | ICD-10-CM

## 2020-09-08 DIAGNOSIS — N183 Chronic kidney disease, stage 3 unspecified: Secondary | ICD-10-CM | POA: Diagnosis not present

## 2020-09-08 DIAGNOSIS — I129 Hypertensive chronic kidney disease with stage 1 through stage 4 chronic kidney disease, or unspecified chronic kidney disease: Secondary | ICD-10-CM | POA: Diagnosis not present

## 2020-09-08 DIAGNOSIS — N400 Enlarged prostate without lower urinary tract symptoms: Secondary | ICD-10-CM | POA: Diagnosis not present

## 2020-09-08 DIAGNOSIS — F410 Panic disorder [episodic paroxysmal anxiety] without agoraphobia: Secondary | ICD-10-CM | POA: Diagnosis not present

## 2020-09-08 DIAGNOSIS — R5383 Other fatigue: Secondary | ICD-10-CM | POA: Diagnosis not present

## 2020-09-08 DIAGNOSIS — Z86718 Personal history of other venous thrombosis and embolism: Secondary | ICD-10-CM | POA: Diagnosis not present

## 2020-09-08 DIAGNOSIS — M129 Arthropathy, unspecified: Secondary | ICD-10-CM | POA: Diagnosis not present

## 2020-09-08 DIAGNOSIS — E785 Hyperlipidemia, unspecified: Secondary | ICD-10-CM | POA: Diagnosis not present

## 2020-09-08 DIAGNOSIS — K219 Gastro-esophageal reflux disease without esophagitis: Secondary | ICD-10-CM | POA: Diagnosis not present

## 2020-09-08 MED ORDER — APIXABAN 5 MG PO TABS: 5.0000 mg | ORAL_TABLET | Freq: Two times a day (BID) | ORAL | 0 refills | Status: DC

## 2020-09-08 MED FILL — ELIQUIS 5 MG TABLET: 5 | 30 days supply | Qty: 60 | Fill #0

## 2020-09-08 NOTE — Patient Instructions (Signed)
[facility] at [facility] Discharge Instructions  You were seen and examined today by Dr. Mosetta Putt. Dr. Mosetta Putt discussed any new changes to your medical history in the past year. Your recent lab work is still good overall but you do seem to have mild kidney injury. Your creatinine is 1.99, it should be under 1. This is common in patient who experience high blood pressure. You should discuss your HCTZ (hydrochlorthiazide) with your primary care provider. We will be changing your blood thinner from Xarelto to Eliquis because it will be easier on your kidneys. When you receive your Eliquis, stop the Xarelto and begin Eliquis 24 hours later.  You can try melatonin to help you sleep at night. You can get this over the counter at any local pharmacy. Continue wearing your compression hose.  We will recheck your labs in 3 months and again in 6 months to see Dr. Ellin Saba.    Thank you for choosing [facility] at [facility] to provide your oncology and hematology care.  To afford each patient quality time with our provider, please arrive at least 15 minutes before your scheduled appointment time.   If you have a lab appointment with the Cancer Center please come in thru the Main Entrance and check in at the main information desk.  You need to re-schedule your appointment should you arrive 10 or more minutes late.  We strive to give you quality time with our providers, and arriving late affects you and other patients whose appointments are after yours.  Also, if you no show three or more times for appointments you may be dismissed from the clinic at the providers discretion.     Again, thank you for choosing [facility].  Our hope is that these requests will decrease the amount of time that you wait before being seen by our physicians.       _____________________________________________________________  Should you have questions after your visit to [facility], please contact our office at [phone] and follow the prompts.  Our office hours are 8:00 a.m. and 4:30 p.m. Monday - Friday.  Please note that voicemails left after 4:00 p.m. may not be returned until the following business day.  We are closed weekends and major holidays.  You do have access to a nurse 24-7, just call the main number to the clinic [phone] and do not press any options, hold on the line and a nurse will answer the phone.    For prescription refill requests, have your pharmacy contact our office and allow 72 hours.    Due to Covid, you will need to wear a mask upon entering the hospital. If you do not have a mask, a mask will be given to you at the Main Entrance upon arrival. For doctor visits, patients may have 1 support person age 63 or older with them. For treatment visits, patients can not have anyone with them due to social distancing guidelines and our immunocompromised population.

## 2020-10-07 ENCOUNTER — Other Ambulatory Visit (HOSPITAL_COMMUNITY): Payer: Self-pay | Admitting: Hematology

## 2020-10-08 ENCOUNTER — Other Ambulatory Visit (HOSPITAL_COMMUNITY): Payer: Self-pay | Admitting: Hematology

## 2020-10-08 MED FILL — ELIQUIS 5 MG TABLET: 5 | 30 days supply | Qty: 60 | Fill #0

## 2020-10-10 ENCOUNTER — Other Ambulatory Visit: Payer: 59

## 2020-10-10 DIAGNOSIS — Z20822 Contact with and (suspected) exposure to covid-19: Secondary | ICD-10-CM

## 2020-10-13 LAB — NOVEL CORONAVIRUS, NAA: SARS-CoV-2, NAA: NOT DETECTED

## 2020-10-21 ENCOUNTER — Other Ambulatory Visit: Payer: Self-pay | Admitting: Internal Medicine

## 2020-10-21 ENCOUNTER — Encounter: Payer: Self-pay | Admitting: Internal Medicine

## 2020-10-21 ENCOUNTER — Ambulatory Visit: Payer: 59 | Admitting: Internal Medicine

## 2020-10-21 ENCOUNTER — Other Ambulatory Visit: Payer: Self-pay

## 2020-10-21 VITALS — BP 148/79 | HR 93 | Temp 98.8°F | Resp 18 | Ht 71.0 in | Wt 207.4 lb

## 2020-10-21 DIAGNOSIS — R7303 Prediabetes: Secondary | ICD-10-CM | POA: Diagnosis not present

## 2020-10-21 DIAGNOSIS — Z7689 Persons encountering health services in other specified circumstances: Secondary | ICD-10-CM

## 2020-10-21 DIAGNOSIS — I10 Essential (primary) hypertension: Secondary | ICD-10-CM

## 2020-10-21 DIAGNOSIS — F419 Anxiety disorder, unspecified: Secondary | ICD-10-CM

## 2020-10-21 DIAGNOSIS — J302 Other seasonal allergic rhinitis: Secondary | ICD-10-CM

## 2020-10-21 DIAGNOSIS — E785 Hyperlipidemia, unspecified: Secondary | ICD-10-CM | POA: Diagnosis not present

## 2020-10-21 DIAGNOSIS — J301 Allergic rhinitis due to pollen: Secondary | ICD-10-CM

## 2020-10-21 DIAGNOSIS — I82409 Acute embolism and thrombosis of unspecified deep veins of unspecified lower extremity: Secondary | ICD-10-CM

## 2020-10-21 DIAGNOSIS — N400 Enlarged prostate without lower urinary tract symptoms: Secondary | ICD-10-CM

## 2020-10-21 MED ORDER — LORATADINE 10 MG PO TABS: 10.0000 mg | ORAL_TABLET | Freq: Every day | ORAL | 1 refills | Status: DC

## 2020-10-21 MED ORDER — LOSARTAN POTASSIUM 50 MG PO TABS: 50.0000 mg | ORAL_TABLET | Freq: Every day | ORAL | 1 refills | Status: DC

## 2020-10-21 MED FILL — LOSARTAN POTASSIUM 50 MG TA: 50 | 30 days supply | Qty: 30 | Fill #0

## 2020-10-21 NOTE — Assessment & Plan Note (Signed)
Allegra PRN

## 2020-10-21 NOTE — Patient Instructions (Signed)
Please start taking Losartan instead of Enalapril-HCTZ.  Please follow DASH diet and perform moderate exercise/walking at least 150 mins/week.  Please check your BP at home and contact us if it is persistently above 150/90.  Please increase water intake to at least 2 liters in a day.

## 2020-10-21 NOTE — Assessment & Plan Note (Signed)
BP Readings from Last 1 Encounters:  10/21/20 (!) 148/79   uncontrolled with Enalapril-HCTZ Discontinue HCTZ due to CKD stage 3b Started Losartan 50 mg QD Counseled for compliance with the medications Advised DASH diet and moderate exercise/walking, at least 150 mins/week

## 2020-10-21 NOTE — Assessment & Plan Note (Signed)
Care established Previous chart reviewed History and medications reviewed with the patient 

## 2020-10-21 NOTE — Assessment & Plan Note (Signed)
HbA1C: 6.1 Advised to follow low carbohydrate diet

## 2020-10-21 NOTE — Progress Notes (Signed)
New Patient Office Visit  Subjective:  Patient ID: Bruce Adkins, male    DOB: Nov 23, 1955  Age: 65 y.o. MRN: [id number]  CC:  Chief Complaint  Patient presents with  . New Patient (Initial Visit)    New patient establishing care     HPI Bruce Adkins is a 65 year old male with PMH of recurrent unprovoked DVT, HTN, BPH, HLD, anxiety and seasonal allergies who presents for establishing care.  Unprovoked DVT: First episode was on 07/03/2015 when he had unprovoked left leg DVT extending from the left posterior tibial vein to left common femoral vein with areas of occlusive thrombus in the profunda femoris and popliteal veins. He was treated with 1 year of anticoagulation with Xarelto.  -He then developed swelling in the left upper extremity on 02/22/2019 and ultrasound showed left axillary and brachial DVT. He was started back on Xarelto. Recently switched to Eliquis due to CKD stage 3b.  He has been taking Enalapril-HCTZ for HTN. He denies any headache, dizziness, chest pain, dyspnea or palpitations.  He takes Flomax for BPH. Denies any urinary hesitance or incontinence currently.  He c/o dry mouth and has been drinking water more recently, which has led to urinary frequency. His HbA1C is 6.1 today. He denies any fatigue or recent weight change.  He takes Xanax occasionally for situational anxiety.  Last colonoscopy in 2012.  Patient is up-to-date with COVID and flu vaccine.   Past Medical History:  Diagnosis Date  . Arthritis   . BPH with obstruction/lower urinary tract symptoms   . CKD (chronic kidney disease)   . DVT (deep venous thrombosis) (HCC) 2016   LLE  . GERD (gastroesophageal reflux disease)   . Hyperlipidemia   . Hypertension   . Panic attacks     History reviewed. No pertinent surgical history.  Family History  Problem Relation Age of Onset  . Heart failure Mother   . Hypertension Mother   . Anxiety disorder Mother   . Hypertension Father   . Cancer  Brother     Social History   Socioeconomic History  . Marital status: Married    Spouse name: Orlin Kann  . Number of children: 3  . Years of education: Not on file  . Highest education level: Not on file  Occupational History  . Not on file  Tobacco Use  . Smoking status: Never Smoker  . Smokeless tobacco: Never Used  Vaping Use  . Vaping Use: Never used  Substance and Sexual Activity  . Alcohol use: No  . Drug use: Not Currently  . Sexual activity: Not on file  Other Topics Concern  . Not on file  Social History Narrative  . Not on file   Social Determinants of Health   Financial Resource Strain: Low Risk   . Difficulty of Paying Living Expenses: Not hard at all  Food Insecurity: No Food Insecurity  . Worried About Programme researcher, broadcasting/film/video in the Last Year: Never true  . Ran Out of Food in the Last Year: Never true  Transportation Needs: No Transportation Needs  . Lack of Transportation (Medical): No  . Lack of Transportation (Non-Medical): No  Physical Activity: Insufficiently Active  . Days of Exercise per Week: 3 days  . Minutes of Exercise per Session: 20 min  Stress: Stress Concern Present  . Feeling of Stress : Very much  Social Connections: Moderately Integrated  . Frequency of Communication with Friends and Family: More than three times a week  .  Frequency of Social Gatherings with Friends and Family: Once a week  . Attends Religious Services: More than 4 times per year  . Active Member of Clubs or Organizations: No  . Attends Banker Meetings: Never  . Marital Status: Married  Catering manager Violence: Not At Risk  . Fear of Current or Ex-Partner: No  . Emotionally Abused: No  . Physically Abused: No  . Sexually Abused: No    ROS Review of Systems  Constitutional: Negative for chills and fever.  HENT: Negative for congestion and sore throat.   Eyes: Negative for pain and discharge.  Respiratory: Negative for cough and shortness of  breath.   Cardiovascular: Negative for chest pain and palpitations.  Gastrointestinal: Negative for constipation, diarrhea, nausea and vomiting.  Endocrine: Positive for polydipsia. Negative for polyuria.  Genitourinary: Negative for dysuria and hematuria.  Musculoskeletal: Negative for neck pain and neck stiffness.  Skin: Negative for rash.  Neurological: Negative for dizziness, weakness, numbness and headaches.  Psychiatric/Behavioral: Negative for agitation and behavioral problems.    Objective:   Today's Vitals: BP (!) 148/79 (BP Location: Right Arm, Patient Position: Sitting, Cuff Size: Normal)   Pulse 93   Temp 98.8 F (37.1 C) (Oral)   Resp 18   Ht 5\' 11"  (1.803 m)   Wt 207 lb 6.4 oz (94.1 kg)   SpO2 96%   BMI 28.93 kg/m   Physical Exam Vitals reviewed.  Constitutional:      General: He is not in acute distress.    Appearance: He is not diaphoretic.  HENT:     Head: Normocephalic and atraumatic.     Nose: Nose normal.     Mouth/Throat:     Mouth: Mucous membranes are moist.  Eyes:     General: No scleral icterus.    Extraocular Movements: Extraocular movements intact.     Pupils: Pupils are equal, round, and reactive to light.  Cardiovascular:     Rate and Rhythm: Normal rate and regular rhythm.     Pulses: Normal pulses.     Heart sounds: Normal heart sounds. No murmur heard.   Pulmonary:     Breath sounds: Normal breath sounds. No wheezing or rales.  Abdominal:     Palpations: Abdomen is soft.     Tenderness: There is no abdominal tenderness.  Musculoskeletal:     Cervical back: Neck supple. No tenderness.     Right lower leg: No edema.     Left lower leg: No edema.  Skin:    General: Skin is warm.     Findings: No rash.  Neurological:     General: No focal deficit present.     Mental Status: He is alert and oriented to person, place, and time.     Sensory: No sensory deficit.     Motor: No weakness.  Psychiatric:        Mood and Affect: Mood  normal.        Behavior: Behavior normal.     Assessment & Plan:   Problem List Items Addressed This Visit      Encounter to establish care - Primary   Care established Previous chart reviewed History and medications reviewed with the patient      Cardiovascular and Mediastinum   Essential hypertension    BP Readings from Last 1 Encounters:  10/21/20 (!) 148/79   uncontrolled with Enalapril-HCTZ Discontinue HCTZ due to CKD stage 3b Started Losartan 50 mg QD Counseled for compliance with the medications  Advised DASH diet and moderate exercise/walking, at least 150 mins/week       Relevant Medications   losartan (COZAAR) 50 MG tablet   Recurrent deep vein thrombosis (DVT) (HCC)    Unprovoked in LLE and LUE Was on Xarelto, recently switched to Eliquis due to CKD Compression stockings as per Hematology Follows up with Hematology      Relevant Medications   losartan (COZAAR) 50 MG tablet     Respiratory   Allergic rhinitis    Allegra PRN      Relevant Medications   loratadine (CLARITIN) 10 MG tablet     Genitourinary   BPH (benign prostatic hyperplasia)    On Flomax        Other   HLD (hyperlipidemia)    On Atorvastatin Check lipid profile in the next visit      Relevant Medications   losartan (COZAAR) 50 MG tablet   Anxiety    Xanax PRN PDMP reviewed            Prediabetes    HbA1C: 6.1 Advised to follow low carbohydrate diet         Outpatient Encounter Medications as of 10/21/2020  Medication Sig  . ALPRAZolam (XANAX) 0.25 MG tablet Take 1 tablet by mouth at bedtime.  Marland Kitchen atorvastatin (LIPITOR) 40 MG tablet Take 40 mg by mouth at bedtime.  Marland Kitchen ELIQUIS 5 MG TABS tablet TAKE 1 TABLET (5 MG TOTAL) BY MOUTH 2 (TWO) TIMES DAILY.  Marland Kitchen losartan (COZAAR) 50 MG tablet Take 1 tablet (50 mg total) by mouth daily.  . tamsulosin (FLOMAX) 0.4 MG CAPS capsule Take 1 capsule (0.4 mg total) by mouth daily.  Marland Kitchen loratadine (CLARITIN) 10 MG tablet Take 1 tablet  (10 mg total) by mouth daily.  . [DISCONTINUED] Enalapril-hydroCHLOROthiazide 5-12.5 MG tablet Take 1 tablet by mouth daily.  . [DISCONTINUED] loratadine (CLARITIN) 10 MG tablet Take 1 tablet (10 mg total) by mouth daily.   No facility-administered encounter medications on file as of 10/21/2020.    Follow-up: Return in about 3 months (around 01/19/2021).   Anabel Halon, MD

## 2020-10-21 NOTE — Assessment & Plan Note (Signed)
On Atorvastatin Check lipid profile in the next visit

## 2020-10-21 NOTE — Assessment & Plan Note (Signed)
Unprovoked in LLE and LUE Was on Xarelto, recently switched to Eliquis due to CKD Compression stockings as per Hematology Follows up with Hematology

## 2020-10-21 NOTE — Assessment & Plan Note (Signed)
Xanax PRN PDMP reviewed

## 2020-10-21 NOTE — Assessment & Plan Note (Signed)
On Flomax 

## 2020-11-02 MED FILL — ATORVASTATIN CALCIUM 20 MG: 20 | 90 days supply | Qty: 90 | Fill #1

## 2020-11-02 MED FILL — TAMSULOSIN HCL 0.4 MG CAP: 0.4 | 30 days supply | Qty: 30 | Fill #1

## 2020-11-09 MED FILL — ELIQUIS 5 MG TABLET: 5 | 30 days supply | Qty: 60 | Fill #1

## 2020-11-20 MED FILL — LOSARTAN POTASSIUM 50 MG TA: 50 | 30 days supply | Qty: 30 | Fill #1

## 2020-11-24 ENCOUNTER — Encounter: Payer: Self-pay | Admitting: Internal Medicine

## 2020-11-24 ENCOUNTER — Other Ambulatory Visit: Payer: Self-pay

## 2020-11-24 ENCOUNTER — Other Ambulatory Visit: Payer: Self-pay | Admitting: Internal Medicine

## 2020-11-24 ENCOUNTER — Telehealth: Payer: 59 | Admitting: Internal Medicine

## 2020-11-24 DIAGNOSIS — I82409 Acute embolism and thrombosis of unspecified deep veins of unspecified lower extremity: Secondary | ICD-10-CM

## 2020-11-24 DIAGNOSIS — F419 Anxiety disorder, unspecified: Secondary | ICD-10-CM | POA: Diagnosis not present

## 2020-11-24 DIAGNOSIS — I10 Essential (primary) hypertension: Secondary | ICD-10-CM

## 2020-11-24 DIAGNOSIS — K529 Noninfective gastroenteritis and colitis, unspecified: Secondary | ICD-10-CM

## 2020-11-24 DIAGNOSIS — N400 Enlarged prostate without lower urinary tract symptoms: Secondary | ICD-10-CM | POA: Diagnosis not present

## 2020-11-24 MED ORDER — TAMSULOSIN HCL 0.4 MG PO CAPS: 0.4000 mg | ORAL_CAPSULE | Freq: Every day | ORAL | 2 refills | Status: DC

## 2020-11-24 MED ORDER — ALPRAZOLAM 0.25 MG PO TABS: 0.2500 mg | ORAL_TABLET | Freq: Every day | ORAL | 2 refills | Status: DC

## 2020-11-24 MED FILL — ALPRAZolam 0.25 MG TABS: 0.25 | 30 days supply | Qty: 30 | Fill #0

## 2020-11-24 NOTE — Assessment & Plan Note (Signed)
Xanax PRN - refilled PDMP reviewed

## 2020-11-24 NOTE — Assessment & Plan Note (Signed)
On Flomax 

## 2020-11-24 NOTE — Patient Instructions (Addendum)

## 2020-11-24 NOTE — Assessment & Plan Note (Signed)
On Eliquis Follows up with Heme/Onc. 

## 2020-11-24 NOTE — Assessment & Plan Note (Signed)
Well-controlled with Losartan Counseled for compliance with the medications Advised DASH diet and moderate exercise/walking, at least 150 mins/week

## 2020-11-24 NOTE — Progress Notes (Signed)
Virtual Visit via Telephone Note   This visit type was conducted due to national recommendations for restrictions regarding the COVID-19 Pandemic (e.g. social distancing) in an effort to limit this patient's exposure and mitigate transmission in our community.  Due to his co-morbid illnesses, this patient is at least at moderate risk for complications without adequate follow up.  This format is felt to be most appropriate for this patient at this time.  The patient did not have access to video technology/had technical difficulties with video requiring transitioning to audio format only (telephone).  All issues noted in this document were discussed and addressed.  No physical exam could be performed with this format.  Evaluation Performed:  Follow-up visit  Date:  11/24/2020   ID:  Bruce Adkins, DOB [birth year], MRN [id number]  Patient Location: Home Provider Location: Office/Clinic  Participants: Patient Location of Patient: Home Location of Provider: Telehealth Consent was obtain for visit to be over via telehealth. I verified that I am speaking with the correct person using two identifiers.  PCP:  Anabel Halon, MD   Chief Complaint:  Loose BM and abdominal pain  History of Present Illness:    Bruce Adkins is a 65 y.o. male with PMH of recurrent unprovoked DVT, HTN, BPH, HLD, anxiety and seasonal allergies who has a televisit for c/o loose BM and generalized, dull abdominal pain for the last 2 weeks. He had watery diarrhea in the beginning, which have improved now. He tried taking Probiotics, which has helped with diarrhea. He also c/o fatigue, and attributes it to frequent urination at nighttime. Of note, he takes Flomax for BPH. He denies any fever, chills, nausea or vomiting currently.  He requests refill for Xanax, which he takes for anxiety.  The patient does not have symptoms concerning for COVID-19 infection (fever, chills, cough, or new shortness of breath).   Past  Medical, Surgical, Social History, Allergies, and Medications have been Reviewed.  Past Medical History:  Diagnosis Date  . Arthritis   . BPH with obstruction/lower urinary tract symptoms   . CKD (chronic kidney disease)   . DVT (deep venous thrombosis) (HCC) 2016   LLE  . GERD (gastroesophageal reflux disease)   . Hyperlipidemia   . Hypertension   . Panic attacks    History reviewed. No pertinent surgical history.   Current Meds  Medication Sig  . ELIQUIS 5 MG TABS tablet TAKE 1 TABLET (5 MG TOTAL) BY MOUTH 2 (TWO) TIMES DAILY.  Marland Kitchen loratadine (CLARITIN) 10 MG tablet Take 1 tablet (10 mg total) by mouth daily.  Marland Kitchen losartan (COZAAR) 50 MG tablet Take 1 tablet (50 mg total) by mouth daily.  . [DISCONTINUED] ALPRAZolam (XANAX) 0.25 MG tablet Take 1 tablet by mouth at bedtime.  . [DISCONTINUED] tamsulosin (FLOMAX) 0.4 MG CAPS capsule Take 1 capsule (0.4 mg total) by mouth daily.     Allergies:   Patient has no known allergies.   ROS:   Please see the history of present illness.     All other systems reviewed and are negative.   Labs/Other Tests and Data Reviewed:    Recent Labs: 09/04/2020: ALT 20; BUN 20; Creatinine, Ser 1.99; Hemoglobin 12.6; Platelets 194; Potassium 4.2; Sodium 137   Recent Lipid Panel Lab Results  Component Value Date/Time   CHOL 167 03/11/2019 10:03 AM   TRIG 88 03/11/2019 10:03 AM   HDL 44 03/11/2019 10:03 AM   CHOLHDL 3.8 03/11/2019 10:03 AM   LDLCALC 105 (  H) 03/11/2019 10:03 AM    Wt Readings from Last 3 Encounters:  10/21/20 207 lb 6.4 oz (94.1 kg)  09/08/20 205 lb 3.2 oz (93.1 kg)  03/13/19 207 lb 9.6 oz (94.2 kg)     ASSESSMENT & PLAN:    Gastroenteritis Likely self-resolving Encouraged for adequate hydration Continue Probiotics Avoid antidiarrheal agents  Essential hypertension Well-controlled with Losartan Counseled for compliance with the medications Advised DASH diet and moderate exercise/walking, at least 150  mins/week   Recurrent deep vein thrombosis (DVT) (HCC) On Eliquis Follows up with Heme/Onc.  BPH (benign prostatic hyperplasia) On Flomax  Anxiety Xanax PRN - refilled PDMP reviewed    Time:   Today, I have spent 16 minutes reviewing the chart, including problem list, medications, and with the patient with telehealth technology discussing the above problems.   Medication Adjustments/Labs and Tests Ordered: Current medicines are reviewed at length with the patient today.  Concerns regarding medicines are outlined above.   Tests Ordered: No orders of the defined types were placed in this encounter.   Medication Changes: Meds ordered this encounter  Medications  . tamsulosin (FLOMAX) 0.4 MG CAPS capsule    Sig: Take 1 capsule (0.4 mg total) by mouth daily.    Dispense:  30 capsule    Refill:  2  . ALPRAZolam (XANAX) 0.25 MG tablet    Sig: Take 1 tablet (0.25 mg total) by mouth at bedtime.    Dispense:  30 tablet    Refill:  2     Note: This dictation was prepared with Dragon dictation along with smaller phrase technology. Similar sounding words can be transcribed inadequately or may not be corrected upon review. Any transcriptional errors that result from this process are unintentional.      Disposition:  Follow up  Signed, Anabel Halon, MD  11/24/2020 10:22 AM     [facility]

## 2020-12-03 ENCOUNTER — Other Ambulatory Visit: Payer: Self-pay | Admitting: Internal Medicine

## 2020-12-03 ENCOUNTER — Encounter (INDEPENDENT_AMBULATORY_CARE_PROVIDER_SITE_OTHER): Payer: Self-pay | Admitting: *Deleted

## 2020-12-03 ENCOUNTER — Encounter: Payer: Self-pay | Admitting: Internal Medicine

## 2020-12-03 ENCOUNTER — Ambulatory Visit: Payer: 59 | Admitting: Internal Medicine

## 2020-12-03 ENCOUNTER — Other Ambulatory Visit: Payer: Self-pay

## 2020-12-03 VITALS — BP 159/98 | HR 100 | Temp 99.0°F | Resp 18 | Ht 71.0 in | Wt 203.4 lb

## 2020-12-03 DIAGNOSIS — K219 Gastro-esophageal reflux disease without esophagitis: Secondary | ICD-10-CM

## 2020-12-03 DIAGNOSIS — R7303 Prediabetes: Secondary | ICD-10-CM

## 2020-12-03 DIAGNOSIS — R195 Other fecal abnormalities: Secondary | ICD-10-CM | POA: Diagnosis not present

## 2020-12-03 DIAGNOSIS — Z1211 Encounter for screening for malignant neoplasm of colon: Secondary | ICD-10-CM | POA: Diagnosis not present

## 2020-12-03 DIAGNOSIS — I10 Essential (primary) hypertension: Secondary | ICD-10-CM | POA: Diagnosis not present

## 2020-12-03 LAB — POCT GLYCOSYLATED HEMOGLOBIN (HGB A1C)
HbA1c, POC (controlled diabetic range): 5.8 % (ref 0.0–7.0)
HbA1c, POC (prediabetic range): 5.8 % (ref 5.7–6.4)

## 2020-12-03 MED ORDER — LOSARTAN POTASSIUM 100 MG PO TABS
100.0000 mg | ORAL_TABLET | Freq: Every day | ORAL | 2 refills | Status: DC
Start: 2020-12-03 — End: 2020-12-03

## 2020-12-03 MED ORDER — FAMOTIDINE 40 MG PO TABS: 40.0000 mg | ORAL_TABLET | Freq: Every day | ORAL | 3 refills | Status: DC

## 2020-12-03 MED FILL — LOSARTAN POTASSIUM 100 MG T: 100 | 30 days supply | Qty: 30 | Fill #0

## 2020-12-03 MED FILL — FAMOTIDINE 40 MG TABLET: 40 | 30 days supply | Qty: 30 | Fill #0

## 2020-12-03 MED FILL — TAMSULOSIN HCL 0.4 MG CAP: 0.4 | 30 days supply | Qty: 30 | Fill #0

## 2020-12-03 NOTE — Progress Notes (Signed)
Established Patient Office Visit  Subjective:  Patient ID: Bruce Adkins, male    DOB: Mar 07, 1956  Age: 65 y.o. MRN: [id number]  CC:  Chief Complaint  Patient presents with  . Abdominal Pain    Pt has been having stomach troubles since jan sometimes diarrhea sometimes frequent urination feeling dehydrated no appetite and several days of fatigue     HPI Bruce Adkins presents for evaluation of intermittent abdominal pain and bloating sensation for last few weeks. He had nausea associated with it earlier. He also has been having loose BM for last few days. He denies any fever, chills, melena or hematochezia. He has not had colonoscopy for last 10 years.  He mentions that he has been feeling fatigued lately and also reports polydipsia. His HbA1C was 5.8 in the office, which is less likely to be contributory.  Past Medical History:  Diagnosis Date  . Arthritis   . BPH with obstruction/lower urinary tract symptoms   . CKD (chronic kidney disease)   . DVT (deep venous thrombosis) (HCC) 2016   LLE  . GERD (gastroesophageal reflux disease)   . Hyperlipidemia   . Hypertension   . Panic attacks     History reviewed. No pertinent surgical history.  Family History  Problem Relation Age of Onset  . Heart failure Mother   . Hypertension Mother   . Anxiety disorder Mother   . Hypertension Father   . Cancer Brother     Social History   Socioeconomic History  . Marital status: Married    Spouse name: [name]  . Number of children: 3  . Years of education: Not on file  . Highest education level: Not on file  Occupational History  . Not on file  Tobacco Use  . Smoking status: Never Smoker  . Smokeless tobacco: Never Used  Vaping Use  . Vaping Use: Never used  Substance and Sexual Activity  . Alcohol use: No  . Drug use: Not Currently  . Sexual activity: Not on file  Other Topics Concern  . Not on file  Social History Narrative  . Not on file   Social  Determinants of Health   Financial Resource Strain: Low Risk   . Difficulty of Paying Living Expenses: Not hard at all  Food Insecurity: No Food Insecurity  . Worried About Programme researcher, broadcasting/film/video in the Last Year: Never true  . Ran Out of Food in the Last Year: Never true  Transportation Needs: No Transportation Needs  . Lack of Transportation (Medical): No  . Lack of Transportation (Non-Medical): No  Physical Activity: Insufficiently Active  . Days of Exercise per Week: 3 days  . Minutes of Exercise per Session: 20 min  Stress: Stress Concern Present  . Feeling of Stress : Very much  Social Connections: Moderately Integrated  . Frequency of Communication with Friends and Family: More than three times a week  . Frequency of Social Gatherings with Friends and Family: Once a week  . Attends Religious Services: More than 4 times per year  . Active Member of Clubs or Organizations: No  . Attends Banker Meetings: Never  . Marital Status: Married  Catering manager Violence: Not At Risk  . Fear of Current or Ex-Partner: No  . Emotionally Abused: No  . Physically Abused: No  . Sexually Abused: No    Outpatient Medications Prior to Visit  Medication Sig Dispense Refill  . ALPRAZolam (XANAX) 0.25 MG tablet Take 1 tablet (  0.25 mg total) by mouth at bedtime. 30 tablet 2  . atorvastatin (LIPITOR) 40 MG tablet Take 40 mg by mouth at bedtime.    Marland Kitchen ELIQUIS 5 MG TABS tablet TAKE 1 TABLET (5 MG TOTAL) BY MOUTH 2 (TWO) TIMES DAILY. 60 tablet 3  . loratadine (CLARITIN) 10 MG tablet Take 1 tablet (10 mg total) by mouth daily. 90 tablet 1  . tamsulosin (FLOMAX) 0.4 MG CAPS capsule Take 1 capsule (0.4 mg total) by mouth daily. 30 capsule 2  . losartan (COZAAR) 50 MG tablet Take 1 tablet (50 mg total) by mouth daily. 90 tablet 1   No facility-administered medications prior to visit.    No Known Allergies  ROS Review of Systems  Constitutional: Positive for fatigue. Negative for chills  and fever.  HENT: Negative for congestion and sore throat.   Eyes: Negative for pain and discharge.  Respiratory: Negative for cough and shortness of breath.   Cardiovascular: Negative for chest pain and palpitations.  Gastrointestinal: Positive for abdominal pain and nausea. Negative for constipation, diarrhea and vomiting.  Endocrine: Positive for polydipsia. Negative for polyuria.  Genitourinary: Negative for dysuria and hematuria.  Musculoskeletal: Negative for neck pain and neck stiffness.  Skin: Negative for rash.  Neurological: Negative for dizziness, weakness, numbness and headaches.  Psychiatric/Behavioral: Negative for agitation and behavioral problems.      Objective:    Physical Exam Vitals reviewed.  Constitutional:      General: He is not in acute distress.    Appearance: He is not diaphoretic.  HENT:     Head: Normocephalic and atraumatic.     Nose: Nose normal.     Mouth/Throat:     Mouth: Mucous membranes are moist.  Eyes:     General: No scleral icterus.    Extraocular Movements: Extraocular movements intact.     Pupils: Pupils are equal, round, and reactive to light.  Cardiovascular:     Rate and Rhythm: Normal rate and regular rhythm.     Heart sounds: No murmur heard.   Pulmonary:     Breath sounds: Normal breath sounds. No wheezing or rales.  Abdominal:     General: There is no distension.     Palpations: Abdomen is soft.     Tenderness: There is no abdominal tenderness. There is no guarding or rebound.  Musculoskeletal:     Cervical back: Neck supple. No tenderness.     Right lower leg: No edema.     Left lower leg: No edema.  Skin:    General: Skin is warm.     Findings: No rash.  Neurological:     General: No focal deficit present.     Mental Status: He is alert and oriented to person, place, and time.     Cranial Nerves: No cranial nerve deficit.     Sensory: No sensory deficit.  Psychiatric:        Mood and Affect: Mood normal.         Behavior: Behavior normal.     BP (!) 159/98   Pulse 100   Temp 99 F (37.2 C) (Oral)   Resp 18   Ht 5\' 11"  (1.803 m)   Wt 203 lb 6.4 oz (92.3 kg)   SpO2 97%   BMI 28.37 kg/m  Wt Readings from Last 3 Encounters:  12/03/20 203 lb 6.4 oz (92.3 kg)  10/21/20 207 lb 6.4 oz (94.1 kg)  09/08/20 205 lb 3.2 oz (93.1 kg)  Health Maintenance Due  Topic Date Due  . Hepatitis C Screening  Never done  . HIV Screening  Never done  . TETANUS/TDAP  Never done    There are no preventive care reminders to display for this patient.  No results found for: TSH Lab Results  Component Value Date   WBC 4.7 09/04/2020   HGB 12.6 (L) 09/04/2020   HCT 38.6 (L) 09/04/2020   MCV 96.0 09/04/2020   PLT 194 09/04/2020   Lab Results  Component Value Date   NA 137 09/04/2020   K 4.2 09/04/2020   CO2 25 09/04/2020   GLUCOSE 146 (H) 09/04/2020   BUN 20 09/04/2020   CREATININE 1.99 (H) 09/04/2020   BILITOT 0.5 09/04/2020   ALKPHOS 70 09/04/2020   AST 23 09/04/2020   ALT 20 09/04/2020   PROT 7.8 09/04/2020   ALBUMIN 4.3 09/04/2020   CALCIUM 9.0 09/04/2020   ANIONGAP 10 09/04/2020   Lab Results  Component Value Date   CHOL 167 03/11/2019   Lab Results  Component Value Date   HDL 44 03/11/2019   Lab Results  Component Value Date   LDLCALC 105 (H) 03/11/2019   Lab Results  Component Value Date   TRIG 88 03/11/2019   Lab Results  Component Value Date   CHOLHDL 3.8 03/11/2019   Lab Results  Component Value Date   HGBA1C 5.8 12/03/2020   HGBA1C 5.8 12/03/2020      Assessment & Plan:   Problem List Items Addressed This Visit      Cardiovascular and Mediastinum   Essential hypertension    BP Readings from Last 1 Encounters:  12/03/20 (!) 159/98   uncontrolled with Losartan 50 mg QD Increased to Losartan 100 mg QD Counseled for compliance with the medications Advised DASH diet and moderate exercise/walking, at least 150 mins/week       Relevant Medications    losartan (COZAAR) 100 MG tablet     Digestive   GERD - Primary    Abdominal pain and nausea could be related to chronic gastritis Trial of Pepcid Referred to GI for further evaluation      Relevant Medications   famotidine (PEPCID) 40 MG tablet     Other   Prediabetes    Lab Results  Component Value Date   HGBA1C 5.8 12/03/2020   HGBA1C 5.8 12/03/2020   Advised to follow low carbohydrate diet      Relevant Orders   POCT glycosylated hemoglobin (Hb A1C) (Completed)    Other Visit Diagnoses    Encounter for screening colonoscopy       Relevant Orders   Ambulatory referral to Gastroenterology   Loose bowel movements   Unclear etiology currently Chronic constipation can lead to intermittent leakage of watery BM or loose BM Advised to use Colace PRN for constipation GI referral for colonoscopy        Meds ordered this encounter  Medications  . famotidine (PEPCID) 40 MG tablet    Sig: Take 1 tablet (40 mg total) by mouth daily.    Dispense:  30 tablet    Refill:  3  . losartan (COZAAR) 100 MG tablet    Sig: Take 1 tablet (100 mg total) by mouth daily.    Dispense:  30 tablet    Refill:  2    Follow-up: Return if symptoms worsen or fail to improve.    Anabel Halon, MD

## 2020-12-03 NOTE — Patient Instructions (Signed)
Please start taking Pepcid for acid reflux.  Please increase water intake to at least 2 liters to avoid constipation and dehydration. Please take Colace for constipation.  Try to keep a food diary to see any correlation of symptoms with certain food.  You are being referred to GI for screening colonoscopy.

## 2020-12-04 NOTE — Assessment & Plan Note (Signed)
BP Readings from Last 1 Encounters:  12/03/20 (!) 159/98   uncontrolled with Losartan 50 mg QD Increased to Losartan 100 mg QD Counseled for compliance with the medications Advised DASH diet and moderate exercise/walking, at least 150 mins/week

## 2020-12-04 NOTE — Assessment & Plan Note (Signed)
Lab Results  Component Value Date   HGBA1C 5.8 12/03/2020   HGBA1C 5.8 12/03/2020   Advised to follow low carbohydrate diet

## 2020-12-04 NOTE — Assessment & Plan Note (Signed)
Abdominal pain and nausea could be related to chronic gastritis Trial of Pepcid Referred to GI for further evaluation

## 2020-12-07 ENCOUNTER — Other Ambulatory Visit (HOSPITAL_COMMUNITY): Payer: Self-pay

## 2020-12-07 DIAGNOSIS — I82409 Acute embolism and thrombosis of unspecified deep veins of unspecified lower extremity: Secondary | ICD-10-CM

## 2020-12-08 ENCOUNTER — Inpatient Hospital Stay (HOSPITAL_COMMUNITY): Payer: 59 | Attending: Hematology

## 2020-12-08 ENCOUNTER — Other Ambulatory Visit: Payer: Self-pay

## 2020-12-08 DIAGNOSIS — I82409 Acute embolism and thrombosis of unspecified deep veins of unspecified lower extremity: Secondary | ICD-10-CM

## 2020-12-08 DIAGNOSIS — Z86718 Personal history of other venous thrombosis and embolism: Secondary | ICD-10-CM | POA: Diagnosis not present

## 2020-12-08 LAB — CBC WITH DIFFERENTIAL/PLATELET
Abs Immature Granulocytes: 0.01 10*3/uL (ref 0.00–0.07)
Basophils Absolute: 0.1 10*3/uL (ref 0.0–0.1)
Basophils Relative: 1 %
Eosinophils Absolute: 0.1 10*3/uL (ref 0.0–0.5)
Eosinophils Relative: 2 %
HCT: 32.3 % — ABNORMAL LOW (ref 39.0–52.0)
Hemoglobin: 10.5 g/dL — ABNORMAL LOW (ref 13.0–17.0)
Immature Granulocytes: 0 %
Lymphocytes Relative: 37 %
Lymphs Abs: 1.7 10*3/uL (ref 0.7–4.0)
MCH: 31.3 pg (ref 26.0–34.0)
MCHC: 32.5 g/dL (ref 30.0–36.0)
MCV: 96.1 fL (ref 80.0–100.0)
Monocytes Absolute: 0.4 10*3/uL (ref 0.1–1.0)
Monocytes Relative: 9 %
Neutro Abs: 2.3 10*3/uL (ref 1.7–7.7)
Neutrophils Relative %: 51 %
Platelets: 181 10*3/uL (ref 150–400)
RBC: 3.36 MIL/uL — ABNORMAL LOW (ref 4.22–5.81)
RDW: 13.2 % (ref 11.5–15.5)
WBC: 4.6 10*3/uL (ref 4.0–10.5)
nRBC: 0 % (ref 0.0–0.2)

## 2020-12-08 LAB — COMPREHENSIVE METABOLIC PANEL
ALT: 18 U/L (ref 0–44)
AST: 18 U/L (ref 15–41)
Albumin: 4.1 g/dL (ref 3.5–5.0)
Alkaline Phosphatase: 72 U/L (ref 38–126)
Anion gap: 10 (ref 5–15)
BUN: 33 mg/dL — ABNORMAL HIGH (ref 8–23)
CO2: 23 mmol/L (ref 22–32)
Calcium: 8.7 mg/dL — ABNORMAL LOW (ref 8.9–10.3)
Chloride: 107 mmol/L (ref 98–111)
Creatinine, Ser: 4.74 mg/dL — ABNORMAL HIGH (ref 0.61–1.24)
GFR, Estimated: 13 mL/min — ABNORMAL LOW (ref >60)
Glucose, Bld: 120 mg/dL — ABNORMAL HIGH (ref 70–99)
Potassium: 4.9 mmol/L (ref 3.5–5.1)
Sodium: 140 mmol/L (ref 135–145)
Total Bilirubin: 0.6 mg/dL (ref 0.3–1.2)
Total Protein: 7.7 g/dL (ref 6.5–8.1)

## 2020-12-14 DIAGNOSIS — I10 Essential (primary) hypertension: Secondary | ICD-10-CM | POA: Diagnosis not present

## 2020-12-14 DIAGNOSIS — R1084 Generalized abdominal pain: Secondary | ICD-10-CM | POA: Diagnosis not present

## 2020-12-14 DIAGNOSIS — E785 Hyperlipidemia, unspecified: Secondary | ICD-10-CM | POA: Diagnosis not present

## 2020-12-14 DIAGNOSIS — R110 Nausea: Secondary | ICD-10-CM | POA: Diagnosis not present

## 2020-12-15 ENCOUNTER — Encounter (INDEPENDENT_AMBULATORY_CARE_PROVIDER_SITE_OTHER): Payer: Self-pay | Admitting: *Deleted

## 2020-12-15 ENCOUNTER — Other Ambulatory Visit: Payer: Self-pay | Admitting: Internal Medicine

## 2020-12-15 ENCOUNTER — Telehealth: Payer: Self-pay

## 2020-12-15 DIAGNOSIS — R195 Other fecal abnormalities: Secondary | ICD-10-CM

## 2020-12-15 NOTE — Telephone Encounter (Signed)
Sent a new referral. It was for loose BM and colonoscopy.

## 2020-12-15 NOTE — Telephone Encounter (Signed)
New referral sent please let pt know

## 2020-12-15 NOTE — Telephone Encounter (Signed)
Bruce Adkins is calling regarding her SP, states that Dr Allena Katz, was suppose to refer for stomach issues. I see that there was a referral placed to [facility], but [facility] office is stating this is only for the colonoscopy, and if something else was going on Stomach related they need another referral.  Please help

## 2020-12-15 NOTE — Telephone Encounter (Signed)
Please advise 

## 2020-12-16 NOTE — Telephone Encounter (Signed)
LM on the machine

## 2020-12-17 ENCOUNTER — Other Ambulatory Visit (INDEPENDENT_AMBULATORY_CARE_PROVIDER_SITE_OTHER): Payer: Self-pay | Admitting: Gastroenterology

## 2020-12-17 ENCOUNTER — Ambulatory Visit (INDEPENDENT_AMBULATORY_CARE_PROVIDER_SITE_OTHER): Payer: 59 | Admitting: Gastroenterology

## 2020-12-17 ENCOUNTER — Encounter (INDEPENDENT_AMBULATORY_CARE_PROVIDER_SITE_OTHER): Payer: Self-pay | Admitting: *Deleted

## 2020-12-17 ENCOUNTER — Telehealth (INDEPENDENT_AMBULATORY_CARE_PROVIDER_SITE_OTHER): Payer: Self-pay | Admitting: *Deleted

## 2020-12-17 ENCOUNTER — Encounter (INDEPENDENT_AMBULATORY_CARE_PROVIDER_SITE_OTHER): Payer: Self-pay | Admitting: Gastroenterology

## 2020-12-17 ENCOUNTER — Encounter (HOSPITAL_COMMUNITY): Payer: Self-pay

## 2020-12-17 ENCOUNTER — Other Ambulatory Visit: Payer: Self-pay

## 2020-12-17 DIAGNOSIS — R109 Unspecified abdominal pain: Secondary | ICD-10-CM | POA: Insufficient documentation

## 2020-12-17 DIAGNOSIS — R6881 Early satiety: Secondary | ICD-10-CM | POA: Diagnosis not present

## 2020-12-17 DIAGNOSIS — R1033 Periumbilical pain: Secondary | ICD-10-CM

## 2020-12-17 DIAGNOSIS — K5900 Constipation, unspecified: Secondary | ICD-10-CM

## 2020-12-17 DIAGNOSIS — D649 Anemia, unspecified: Secondary | ICD-10-CM | POA: Insufficient documentation

## 2020-12-17 MED ORDER — SUPREP BOWEL PREP KIT 17.5-3.13-1.6 GM/177ML PO SOLN: 1.0000 | Freq: Once | ORAL | 0 refills | Status: DC

## 2020-12-17 MED FILL — SUPREP BOWEL PREP KIT: 17.5-3.13-1 | 1 days supply | Qty: 354 | Fill #0

## 2020-12-17 NOTE — H&P (View-Only) (Signed)
Bruce Adkins, M.D. Gastroenterology & Hepatology [facility] [address] [place], [place] [address] Primary Care Physician: Bruce Halon, MD [address] [place] [address]  Referring MD: PCP  Chief Complaint: Abdominal pain, weight loss and loose bowel movement frequency/consistency.  History of Present Illness: Bruce Adkins is a 65 y.o. 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  Patient reports that a month ago he noticed some intermittent mid abdominal pain described as as dull and cramping pain that did not radiate anywhere else, which was followed by episodes of constipation which have lasted for a max of three days. He reports also noticing some episodes of loose bowel movements in between, but he denies having any watery bowel movements. He has not presented melena or hematochezia. He has presented consistently nausea on a daily basis but no vomiting. He also reports having some heartburn for the last 2-3 weeks, which he has been treating with Pepcid 40 mg qday which controls his symptoms completely. Has also noticed some early satiety for the last month. Has lost 8 lb since 07/2020 which was not intentional.  He drinks fluids frequent .He likes eating fiber in his food as he eats plenty of vegetables.  The patient denies having any fever, chills, hematochezia, melena, hematemesis,  diarrhea, jaundice, pruritus.  Upon review of his medical record, the patient had blood testing performed on 12/08/2020.  He was found to have worsening renal function with creatinine of 4.74, BUN of 33, hemoglobin of 10.5 indicative of anemia with normal MCV 96.1.  The patient reported that he is aware his renal function is getting worse.  Last XVQ:MGQQP Last Colonoscopy: 10 years ago, normal per  patient  FHx: neg for any gastrointestinal/liver disease, brother prostate cancer, sister leukemia, grandfather leukemia  Social: neg smoking, alcohol or illicit drug use Surgical: no abdominal surgeries  Past Medical History: Past Medical History:  Diagnosis Date  . Arthritis   . BPH with obstruction/lower urinary tract symptoms   . CKD (chronic kidney disease)   . DVT (deep venous thrombosis) (HCC) 2016   LLE  . GERD (gastroesophageal reflux disease)   . Hyperlipidemia   . Hypertension   . Panic attacks     Past Surgical History:History reviewed. No pertinent surgical history.  Family History: Family History  Problem Relation Age of Onset  . Heart failure Mother   . Hypertension Mother   . Anxiety disorder Mother   . Hypertension Father   . Cancer Brother     Social History: Social History   Tobacco Use  Smoking Status Never Smoker  Smokeless Tobacco Never Used   Social History   Substance and Sexual Activity  Alcohol Use No   Social History   Substance and Sexual Activity  Drug Use Not Currently    Allergies: No Known Allergies  Medications: Current Outpatient Medications  Medication Sig Dispense Refill  . ALPRAZolam (XANAX) 0.25 MG tablet Take 1 tablet (0.25 mg total) by mouth at bedtime. 30 tablet 2  . atorvastatin (LIPITOR) 40 MG tablet Take 40 mg by mouth at bedtime.    Marland Kitchen ELIQUIS 5 MG TABS tablet TAKE 1 TABLET (5 MG TOTAL) BY MOUTH 2 (TWO) TIMES DAILY. 60 tablet 3  . famotidine (PEPCID) 40 MG tablet Take 1 tablet (40 mg total) by mouth daily. 30 tablet 3  .  loratadine (CLARITIN) 10 MG tablet Take 1 tablet (10 mg total) by mouth daily. 90 tablet 1  . losartan (COZAAR) 100 MG tablet Take 1 tablet (100 mg total) by mouth daily. 30 tablet 2  . tamsulosin (FLOMAX) 0.4 MG CAPS capsule Take 1 capsule (0.4 mg total) by mouth daily. 30 capsule 2   No current facility-administered medications for this visit.    Review of Systems: GENERAL: negative for  malaise, night sweats HEENT: No changes in hearing or vision, no nose bleeds or other nasal problems. NECK: Negative for lumps, goiter, pain and significant neck swelling RESPIRATORY: Negative for cough, wheezing CARDIOVASCULAR: Negative for chest pain, leg swelling, palpitations, orthopnea GI: SEE HPI MUSCULOSKELETAL: Negative for joint pain or swelling, back pain, and muscle pain. SKIN: Negative for lesions, rash PSYCH: Negative for sleep disturbance, mood disorder and recent psychosocial stressors. HEMATOLOGY Negative for prolonged bleeding, bruising easily, and swollen nodes. ENDOCRINE: Negative for cold or heat intolerance, polyuria, polydipsia and goiter. NEURO: negative for tremor, gait imbalance, syncope and seizures. The remainder of the review of systems is noncontributory.   Physical Exam: BP 121/89 (BP Location: Left Arm, Patient Position: Sitting, Cuff Size: Large)   Pulse 80   Temp 98.1 F (36.7 C) (Oral)   Ht 5\' 11"  (1.803 m)   Wt 199 lb (90.3 kg)   BMI 27.75 kg/m  GENERAL: The patient is AO x3, in no acute distress. HEENT: Head is normocephalic and atraumatic. EOMI are intact. Mouth is well hydrated and without lesions. NECK: Supple. No masses LUNGS: Clear to auscultation. No presence of rhonchi/wheezing/rales. Adequate chest expansion HEART: RRR, normal s1 and s2. ABDOMEN: Soft, nontender, no guarding, no peritoneal signs, and nondistended. BS +. No masses. EXTREMITIES: Without any cyanosis, clubbing, rash, lesions or edema. NEUROLOGIC: AOx3, no focal motor deficit. SKIN: no jaundice, no rashes   Imaging/Labs: as above  I personally reviewed and interpreted the available labs, imaging and endoscopic files.  Impression and Plan: Bruce Adkins is a 65 y.o. 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  The patient presents  with new onset of symptoms characterized by loose bowel movements and constipation with some early satiety and weight loss of relatively recent onset.  It is unclear to me why he has presented with these new symptoms at this point, but given these findings, I think endoscopic evaluation with EGD with small bowel biopsies and colonoscopy with possible random colonic biopsies are warranted at this time given the patient's age and new onset of symptoms.  We will also check for causes such as thyroid dysfunction or celiac disease with serologies and TSH.  Notably, he had evidence of anemia in most recent blood testing performed, will check if he is presenting iron deficiency at this point.  Patient was counseled to implement the use of MiraLAX to improve his bowel movement frequency if constipated.  -Schedule EGD and colonoscopy - Start taking Miralax 1 cap every day for one week. If bowel movements do not improve, increase to 1 cup every 12 hours. If after two weeks there is no improvement, increase to 1 cup every 8 hours - Check iron stores, TTG IgA and TSH  All questions were answered.      65, MD Gastroenterology and Hepatology [facility]

## 2020-12-17 NOTE — Progress Notes (Signed)
Bruce Adkins, M.D. Gastroenterology & Hepatology [facility] [address] [place], [place] [address] Primary Care Physician: Patel, Rutwik K, MD [address] [place] [address]  Referring MD: PCP  Chief Complaint: Abdominal pain, weight loss and loose bowel movement frequency/consistency.  History of Present Illness: Bruce Adkins is a 64 y.o. 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  Patient reports that a month ago he noticed some intermittent mid abdominal pain described as as dull and cramping pain that did not radiate anywhere else, which was followed by episodes of constipation which have lasted for a max of three days. He reports also noticing some episodes of loose bowel movements in between, but he denies having any watery bowel movements. He has not presented melena or hematochezia. He has presented consistently nausea on a daily basis but no vomiting. He also reports having some heartburn for the last 2-3 weeks, which he has been treating with Pepcid 40 mg qday which controls his symptoms completely. Has also noticed some early satiety for the last month. Has lost 8 lb since 07/2020 which was not intentional.  He drinks fluids frequent .He likes eating fiber in his food as he eats plenty of vegetables.  The patient denies having any fever, chills, hematochezia, melena, hematemesis,  diarrhea, jaundice, pruritus.  Upon review of his medical record, the patient had blood testing performed on 12/08/2020.  He was found to have worsening renal function with creatinine of 4.74, BUN of 33, hemoglobin of 10.5 indicative of anemia with normal MCV 96.1.  The patient reported that he is aware his renal function is getting worse.  Last EGD:never Last Colonoscopy: 10 years ago, normal per  patient  FHx: neg for any gastrointestinal/liver disease, brother prostate cancer, sister leukemia, grandfather leukemia  Social: neg smoking, alcohol or illicit drug use Surgical: no abdominal surgeries  Past Medical History: Past Medical History:  Diagnosis Date  . Arthritis   . BPH with obstruction/lower urinary tract symptoms   . CKD (chronic kidney disease)   . DVT (deep venous thrombosis) (HCC) 2016   LLE  . GERD (gastroesophageal reflux disease)   . Hyperlipidemia   . Hypertension   . Panic attacks     Past Surgical History:History reviewed. No pertinent surgical history.  Family History: Family History  Problem Relation Age of Onset  . Heart failure Mother   . Hypertension Mother   . Anxiety disorder Mother   . Hypertension Father   . Cancer Brother     Social History: Social History   Tobacco Use  Smoking Status Never Smoker  Smokeless Tobacco Never Used   Social History   Substance and Sexual Activity  Alcohol Use No   Social History   Substance and Sexual Activity  Drug Use Not Currently    Allergies: No Known Allergies  Medications: Current Outpatient Medications  Medication Sig Dispense Refill  . ALPRAZolam (XANAX) 0.25 MG tablet Take 1 tablet (0.25 mg total) by mouth at bedtime. 30 tablet 2  . atorvastatin (LIPITOR) 40 MG tablet Take 40 mg by mouth at bedtime.    . ELIQUIS 5 MG TABS tablet TAKE 1 TABLET (5 MG TOTAL) BY MOUTH 2 (TWO) TIMES DAILY. 60 tablet 3  . famotidine (PEPCID) 40 MG tablet Take 1 tablet (40 mg total) by mouth daily. 30 tablet 3  .   loratadine (CLARITIN) 10 MG tablet Take 1 tablet (10 mg total) by mouth daily. 90 tablet 1  . losartan (COZAAR) 100 MG tablet Take 1 tablet (100 mg total) by mouth daily. 30 tablet 2  . tamsulosin (FLOMAX) 0.4 MG CAPS capsule Take 1 capsule (0.4 mg total) by mouth daily. 30 capsule 2   No current facility-administered medications for this visit.    Review of Systems: GENERAL: negative for  malaise, night sweats HEENT: No changes in hearing or vision, no nose bleeds or other nasal problems. NECK: Negative for lumps, goiter, pain and significant neck swelling RESPIRATORY: Negative for cough, wheezing CARDIOVASCULAR: Negative for chest pain, leg swelling, palpitations, orthopnea GI: SEE HPI MUSCULOSKELETAL: Negative for joint pain or swelling, back pain, and muscle pain. SKIN: Negative for lesions, rash PSYCH: Negative for sleep disturbance, mood disorder and recent psychosocial stressors. HEMATOLOGY Negative for prolonged bleeding, bruising easily, and swollen nodes. ENDOCRINE: Negative for cold or heat intolerance, polyuria, polydipsia and goiter. NEURO: negative for tremor, gait imbalance, syncope and seizures. The remainder of the review of systems is noncontributory.   Physical Exam: BP 121/89 (BP Location: Left Arm, Patient Position: Sitting, Cuff Size: Large)   Pulse 80   Temp 98.1 F (36.7 C) (Oral)   Ht 5\' 11"  (1.803 m)   Wt 199 lb (90.3 kg)   BMI 27.75 kg/m  GENERAL: The patient is AO x3, in no acute distress. HEENT: Head is normocephalic and atraumatic. EOMI are intact. Mouth is well hydrated and without lesions. NECK: Supple. No masses LUNGS: Clear to auscultation. No presence of rhonchi/wheezing/rales. Adequate chest expansion HEART: RRR, normal s1 and s2. ABDOMEN: Soft, nontender, no guarding, no peritoneal signs, and nondistended. BS +. No masses. EXTREMITIES: Without any cyanosis, clubbing, rash, lesions or edema. NEUROLOGIC: AOx3, no focal motor deficit. SKIN: no jaundice, no rashes   Imaging/Labs: as above  I personally reviewed and interpreted the available labs, imaging and endoscopic files.  Impression and Plan: Bruce Adkins is a 65 y.o. 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  The patient presents  with new onset of symptoms characterized by loose bowel movements and constipation with some early satiety and weight loss of relatively recent onset.  It is unclear to me why he has presented with these new symptoms at this point, but given these findings, I think endoscopic evaluation with EGD with small bowel biopsies and colonoscopy with possible random colonic biopsies are warranted at this time given the patient's age and new onset of symptoms.  We will also check for causes such as thyroid dysfunction or celiac disease with serologies and TSH.  Notably, he had evidence of anemia in most recent blood testing performed, will check if he is presenting iron deficiency at this point.  Patient was counseled to implement the use of MiraLAX to improve his bowel movement frequency if constipated.  -Schedule EGD and colonoscopy - Start taking Miralax 1 cap every day for one week. If bowel movements do not improve, increase to 1 cup every 12 hours. If after two weeks there is no improvement, increase to 1 cup every 8 hours - Check iron stores, TTG IgA and TSH  All questions were answered.      65, MD Gastroenterology and Hepatology [facility]

## 2020-12-17 NOTE — Patient Instructions (Signed)
Schedule EGD and colonoscopy Start taking Miralax 1 cap every day for one week. If bowel movements do not improve, increase to 1 cup every 12 hours. If after two weeks there is no improvement, increase to 1 cup every 8 hours Perform blood workup

## 2020-12-17 NOTE — Telephone Encounter (Signed)
Thanks

## 2020-12-17 NOTE — Telephone Encounter (Signed)
Prep sent

## 2020-12-17 NOTE — Telephone Encounter (Signed)
Patient needs suprep 

## 2020-12-17 NOTE — Progress Notes (Signed)
Ann with Dr. Karilyn Cota and  Dr. Levon Hedger office called asking if patient can hold eliquis for two days prior to colonoscopy per Dr. Ellin Saba this is okay.

## 2020-12-17 NOTE — Telephone Encounter (Signed)
Per Chasity with Dr Ellin Saba it is ok for patient to hold Eliquis 2 days prior to procedure, patient aware

## 2020-12-18 LAB — IGA: Immunoglobulin A: 401 mg/dL — ABNORMAL HIGH (ref 70–320)

## 2020-12-18 LAB — TISSUE TRANSGLUTAMINASE, IGA: (tTG) Ab, IgA: <1 U/mL

## 2020-12-18 LAB — IRON, TOTAL/TOTAL IRON BINDING CAP
%SAT: 36 % (calc) (ref 20–48)
Iron: 97 ug/dL (ref 50–180)
TIBC: 270 mcg/dL (calc) (ref 250–425)

## 2020-12-18 LAB — TSH: TSH: 1.53 mIU/L (ref 0.40–4.50)

## 2020-12-18 LAB — FERRITIN: Ferritin: 296 ng/mL (ref 24–380)

## 2020-12-26 ENCOUNTER — Other Ambulatory Visit (HOSPITAL_COMMUNITY): Payer: Self-pay

## 2020-12-28 ENCOUNTER — Encounter (HOSPITAL_COMMUNITY): Payer: Self-pay | Admitting: Gastroenterology

## 2020-12-28 ENCOUNTER — Other Ambulatory Visit (HOSPITAL_COMMUNITY)
Admission: RE | Admit: 2020-12-28 | Discharge: 2020-12-28 | Disposition: A | Discharge status: Home / Self Care | Payer: 59 | Source: Ambulatory Visit | Attending: Gastroenterology | Admitting: Gastroenterology

## 2020-12-28 ENCOUNTER — Other Ambulatory Visit: Payer: Self-pay

## 2020-12-28 DIAGNOSIS — Z20822 Contact with and (suspected) exposure to covid-19: Secondary | ICD-10-CM | POA: Insufficient documentation

## 2020-12-28 DIAGNOSIS — Z01812 Encounter for preprocedural laboratory examination: Secondary | ICD-10-CM | POA: Insufficient documentation

## 2020-12-29 LAB — SARS CORONAVIRUS 2 (TAT 6-24 HRS): SARS Coronavirus 2: NEGATIVE

## 2020-12-30 ENCOUNTER — Other Ambulatory Visit: Payer: Self-pay

## 2020-12-30 ENCOUNTER — Ambulatory Visit (HOSPITAL_COMMUNITY): Payer: 59 | Admitting: Anesthesiology

## 2020-12-30 ENCOUNTER — Other Ambulatory Visit (HOSPITAL_COMMUNITY): Payer: Self-pay

## 2020-12-30 ENCOUNTER — Encounter (HOSPITAL_COMMUNITY)
Admission: RE | Disposition: A | Discharge status: Home / Self Care | Payer: Self-pay | Source: Home / Self Care | Attending: Gastroenterology

## 2020-12-30 ENCOUNTER — Telehealth (INDEPENDENT_AMBULATORY_CARE_PROVIDER_SITE_OTHER): Payer: Self-pay | Admitting: *Deleted

## 2020-12-30 ENCOUNTER — Ambulatory Visit (HOSPITAL_COMMUNITY)
Admission: RE | Admit: 2020-12-30 | Discharge: 2020-12-30 | Disposition: A | Discharge status: Home / Self Care | Payer: 59 | Attending: Gastroenterology | Admitting: Gastroenterology

## 2020-12-30 ENCOUNTER — Other Ambulatory Visit (INDEPENDENT_AMBULATORY_CARE_PROVIDER_SITE_OTHER): Payer: Self-pay | Admitting: Gastroenterology

## 2020-12-30 ENCOUNTER — Encounter (HOSPITAL_COMMUNITY): Payer: Self-pay | Admitting: Gastroenterology

## 2020-12-30 ENCOUNTER — Encounter (INDEPENDENT_AMBULATORY_CARE_PROVIDER_SITE_OTHER): Payer: Self-pay | Admitting: *Deleted

## 2020-12-30 DIAGNOSIS — D649 Anemia, unspecified: Secondary | ICD-10-CM | POA: Diagnosis not present

## 2020-12-30 DIAGNOSIS — Z6827 Body mass index (BMI) 27.0-27.9, adult: Secondary | ICD-10-CM | POA: Diagnosis not present

## 2020-12-30 DIAGNOSIS — Z139 Encounter for screening, unspecified: Secondary | ICD-10-CM | POA: Diagnosis not present

## 2020-12-30 DIAGNOSIS — Z818 Family history of other mental and behavioral disorders: Secondary | ICD-10-CM | POA: Diagnosis not present

## 2020-12-30 DIAGNOSIS — K222 Esophageal obstruction: Secondary | ICD-10-CM

## 2020-12-30 DIAGNOSIS — E785 Hyperlipidemia, unspecified: Secondary | ICD-10-CM | POA: Diagnosis not present

## 2020-12-30 DIAGNOSIS — K219 Gastro-esophageal reflux disease without esophagitis: Secondary | ICD-10-CM | POA: Insufficient documentation

## 2020-12-30 DIAGNOSIS — R109 Unspecified abdominal pain: Secondary | ICD-10-CM

## 2020-12-30 DIAGNOSIS — Z86718 Personal history of other venous thrombosis and embolism: Secondary | ICD-10-CM | POA: Diagnosis not present

## 2020-12-30 DIAGNOSIS — K5900 Constipation, unspecified: Secondary | ICD-10-CM | POA: Diagnosis not present

## 2020-12-30 DIAGNOSIS — Z7901 Long term (current) use of anticoagulants: Secondary | ICD-10-CM | POA: Diagnosis not present

## 2020-12-30 DIAGNOSIS — Z8042 Family history of malignant neoplasm of prostate: Secondary | ICD-10-CM | POA: Insufficient documentation

## 2020-12-30 DIAGNOSIS — N400 Enlarged prostate without lower urinary tract symptoms: Secondary | ICD-10-CM | POA: Insufficient documentation

## 2020-12-30 DIAGNOSIS — Z807 Family history of other malignant neoplasms of lymphoid, hematopoietic and related tissues: Secondary | ICD-10-CM | POA: Insufficient documentation

## 2020-12-30 DIAGNOSIS — I10 Essential (primary) hypertension: Secondary | ICD-10-CM | POA: Diagnosis not present

## 2020-12-30 DIAGNOSIS — Z8249 Family history of ischemic heart disease and other diseases of the circulatory system: Secondary | ICD-10-CM | POA: Insufficient documentation

## 2020-12-30 DIAGNOSIS — R110 Nausea: Secondary | ICD-10-CM | POA: Insufficient documentation

## 2020-12-30 DIAGNOSIS — R112 Nausea with vomiting, unspecified: Secondary | ICD-10-CM

## 2020-12-30 DIAGNOSIS — N289 Disorder of kidney and ureter, unspecified: Secondary | ICD-10-CM | POA: Insufficient documentation

## 2020-12-30 DIAGNOSIS — K644 Residual hemorrhoidal skin tags: Secondary | ICD-10-CM | POA: Insufficient documentation

## 2020-12-30 DIAGNOSIS — Z79899 Other long term (current) drug therapy: Secondary | ICD-10-CM | POA: Insufficient documentation

## 2020-12-30 DIAGNOSIS — R197 Diarrhea, unspecified: Secondary | ICD-10-CM | POA: Diagnosis not present

## 2020-12-30 DIAGNOSIS — R6881 Early satiety: Secondary | ICD-10-CM | POA: Insufficient documentation

## 2020-12-30 DIAGNOSIS — K648 Other hemorrhoids: Secondary | ICD-10-CM | POA: Insufficient documentation

## 2020-12-30 DIAGNOSIS — F419 Anxiety disorder, unspecified: Secondary | ICD-10-CM | POA: Diagnosis not present

## 2020-12-30 DIAGNOSIS — R634 Abnormal weight loss: Secondary | ICD-10-CM | POA: Insufficient documentation

## 2020-12-30 HISTORY — PX: BIOPSY: SHX5522

## 2020-12-30 HISTORY — PX: ESOPHAGOGASTRODUODENOSCOPY (EGD) WITH PROPOFOL: SHX5813

## 2020-12-30 HISTORY — PX: COLONOSCOPY WITH PROPOFOL: SHX5780

## 2020-12-30 HISTORY — PX: BALLOON DILATION: SHX5330

## 2020-12-30 LAB — HM COLONOSCOPY

## 2020-12-30 SURGERY — ESOPHAGOGASTRODUODENOSCOPY (EGD) WITH PROPOFOL
Anesthesia: General

## 2020-12-30 MED ORDER — PROPOFOL 500 MG/50ML IV EMUL
INTRAVENOUS | Status: DC | PRN
  Administered 2020-12-30: 150 ug/kg/min via INTRAVENOUS

## 2020-12-30 MED ORDER — ONDANSETRON HCL 4 MG PO TABS
4.0000 mg | ORAL_TABLET | Freq: Three times a day (TID) | ORAL | 1 refills | Status: DC | PRN
  Filled 2020-12-30: qty 30, 10d supply, fill #0

## 2020-12-30 MED ORDER — STERILE WATER FOR IRRIGATION IR SOLN
Status: DC | PRN
  Administered 2020-12-30: 200 mL

## 2020-12-30 MED ORDER — LACTATED RINGERS IV SOLN
INTRAVENOUS | Status: DC
  Administered 2020-12-30: 1000 mL via INTRAVENOUS

## 2020-12-30 MED ORDER — PROPOFOL 10 MG/ML IV BOLUS
INTRAVENOUS | Status: DC | PRN
  Administered 2020-12-30: 100 mg via INTRAVENOUS

## 2020-12-30 MED ORDER — LIDOCAINE HCL (CARDIAC) PF 100 MG/5ML IV SOSY
PREFILLED_SYRINGE | INTRAVENOUS | Status: DC | PRN
  Administered 2020-12-30: 80 mg via INTRAVENOUS

## 2020-12-30 NOTE — Telephone Encounter (Signed)
Per EGD op note, patient needs abd [place] scheduled

## 2020-12-30 NOTE — Discharge Instructions (Signed)
You are being discharged to home.  Resume your previous diet.  We are waiting for your pathology results. Continue famotidine 40 mg qday.  Start zofran as needed for nausea. Schedule abdominal US. Continue Miralax daily Your physician has recommended a repeat colonoscopy in 10 years for screening purposes.    Colonoscopy, Adult, Care After This sheet gives you information about how to care for yourself after your procedure. Your health care provider may also give you more specific instructions. If you have problems or questions, contact your health care provider.  [phone] What can I expect after the procedure? After the procedure, it is common to have:  A small amount of blood in your stool for 24 hours after the procedure.  Some gas.  Mild cramping or bloating of your abdomen. Follow these instructions at home: Eating and drinking  Drink enough fluid to keep your urine pale yellow.  Follow instructions from your health care provider about eating or drinking restrictions.  Resume your normal diet as instructed by your health care provider.    Activity  Rest as told by your health care provider.  Avoid sitting for a long time without moving. Get up to take short walks every 1-2 hours. This is important to improve blood flow and breathing. Ask for help if you feel weak or unsteady.  Return to your normal activities as told by your health care provider. Ask your health care provider what activities are safe for you. Managing cramping and bloating  Try walking around when you have cramps or feel bloated.    General instructions  If you were given a sedative during the procedure, it can affect you for several hours. Do not drive or operate machinery until your health care provider says that it is safe.  For the first 24 hours after the procedure: ? Do not sign important documents. ? Do not drink alcohol. ? Do your regular daily activities at a slower pace than  normal. ? Eat soft foods that are easy to digest.  Take over-the-counter and prescription medicines only as told by your health care provider.  Keep all follow-up visits as told by your health care provider. This is important. Contact a health care provider if:  You have blood in your stool 2-3 days after the procedure. Get help right away if you have:  More than a small spotting of blood in your stool.  Large blood clots in your stool.  Swelling of your abdomen.  Nausea or vomiting.  A fever.  Increasing pain in your abdomen that is not relieved with medicine. Summary  After the procedure, it is common to have a small amount of blood in your stool. You may also have mild cramping and bloating of your abdomen.  If you were given a sedative during the procedure, it can affect you for several hours. Do not drive or operate machinery until your health care provider says that it is safe.  Get help right away if you have a lot of blood in your stool, nausea or vomiting, a fever, or increased pain in your abdomen. This information is not intended to replace advice given to you by your health care provider. Make sure you discuss any questions you have with your health care provider. Document Revised: 09/06/2019 Document Reviewed: 04/08/2019 Elsevier Patient Education  2021 Elsevier Inc.    Upper Endoscopy, Adult, Care After This sheet gives you information about how to care for yourself after your procedure. Your health care provider may  also give you more specific instructions. If you have problems or questions, contact your health care provider. What can I expect after the procedure? After the procedure, it is common to have:  A sore throat.  Mild stomach pain or discomfort.  Bloating.  Nausea. Follow these instructions at home:  Follow instructions from your health care provider about what to eat or drink after your procedure.  Return to your normal activities as told  by your health care provider. Ask your health care provider what activities are safe for you.  Take over-the-counter and prescription medicines only as told by your health care provider.  If you were given a sedative during the procedure, it can affect you for several hours. Do not drive or operate machinery until your health care provider says that it is safe.  Keep all follow-up visits as told by your health care provider. This is important.   Contact a health care provider if you have:  A sore throat that lasts longer than one day.  Trouble swallowing. Get help right away if:  You vomit blood or your vomit looks like coffee grounds.  You have: ? A fever. ? Bloody, black, or tarry stools. ? A severe sore throat or you cannot swallow. ? Difficulty breathing. ? Severe pain in your chest or abdomen. Summary  After the procedure, it is common to have a sore throat, mild stomach discomfort, bloating, and nausea.  If you were given a sedative during the procedure, it can affect you for several hours. Do not drive or operate machinery until your health care provider says that it is safe.  Follow instructions from your health care provider about what to eat or drink after your procedure.  Return to your normal activities as told by your health care provider. This information is not intended to replace advice given to you by your health care provider. Make sure you discuss any questions you have with your health care provider. Document Revised: 09/10/2019 Document Reviewed: 02/12/2018 Elsevier Patient Education  2021 [employer].

## 2020-12-30 NOTE — Anesthesia Postprocedure Evaluation (Signed)
Anesthesia Post Note  Patient: Bruce Adkins  Procedure(s) Performed: ESOPHAGOGASTRODUODENOSCOPY (EGD) WITH PROPOFOL (N/A ) COLONOSCOPY WITH PROPOFOL (N/A ) BALLOON DILATION BIOPSY  Patient location during evaluation: Phase II Anesthesia Type: General Level of consciousness: awake and alert Pain management: satisfactory to patient Vital Signs Assessment: post-procedure vital signs reviewed and stable Respiratory status: spontaneous breathing and respiratory function stable Cardiovascular status: stable Postop Assessment: no apparent nausea or vomiting Anesthetic complications: no   No complications documented.   Last Vitals:  Vitals:   12/30/20 0807  BP: (!) 167/97  Pulse: 74  Resp: 14  Temp: 36.9 C  SpO2: 99%    Last Pain:  Vitals:   12/30/20 0924  TempSrc:   PainSc: 0-No pain                 Lorin Glass

## 2020-12-30 NOTE — Anesthesia Preprocedure Evaluation (Signed)
Anesthesia Evaluation  Patient identified by MRN, date of birth, ID band Patient awake    Reviewed: Allergy & Precautions, H&P , NPO status , Patient's Chart, lab work & pertinent test results, reviewed documented beta blocker date and time   Airway Mallampati: III  TM Distance: >3 FB Neck ROM: full    Dental no notable dental hx.    Pulmonary neg pulmonary ROS,    Pulmonary exam normal breath sounds clear to auscultation       Cardiovascular Exercise Tolerance: Good hypertension, negative cardio ROS   Rhythm:regular Rate:Normal     Neuro/Psych PSYCHIATRIC DISORDERS Anxiety negative neurological ROS     GI/Hepatic Neg liver ROS, GERD  Medicated,  Endo/Other  negative endocrine ROS  Renal/GU CRFRenal disease  negative genitourinary   Musculoskeletal   Abdominal   Peds  Hematology  (+) Blood dyscrasia, anemia ,   Anesthesia Other Findings   Reproductive/Obstetrics negative OB ROS                             Anesthesia Physical Anesthesia Plan  ASA: II  Anesthesia Plan: General   Post-op Pain Management:    Induction:   PONV Risk Score and Plan: Propofol infusion  Airway Management Planned:   Additional Equipment:   Intra-op Plan:   Post-operative Plan:   Informed Consent: I have reviewed the patients History and Physical, chart, labs and discussed the procedure including the risks, benefits and alternatives for the proposed anesthesia with the patient or authorized representative who has indicated his/her understanding and acceptance.     Dental Advisory Given  Plan Discussed with: CRNA  Anesthesia Plan Comments:         Anesthesia Quick Evaluation

## 2020-12-30 NOTE — Op Note (Signed)
[facility] Patient Name: Bruce Adkins Procedure Date: 12/30/2020 9:42 AM MRN: [id number] Date of Birth: Sep 05, 1956 Attending MD: [name] ,  CSN: [id number] Age: 65 Admit Type: Outpatient Procedure:                Colonoscopy Indications:              Abdominal pain, nausea Providers:                [name], Sheran Fava, Burke Keels, Technician Referring MD:              Medicines:                Monitored Anesthesia Care Complications:            No immediate complications. Estimated Blood Loss:     Estimated blood loss: none. Procedure:                Pre-Anesthesia Assessment:                           - Prior to the procedure, a History and Physical                            was performed, and patient medications, allergies                            and sensitivities were reviewed. The patient's                            tolerance of previous anesthesia was reviewed.                           - The risks and benefits of the procedure and the                            sedation options and risks were discussed with the                            patient. All questions were answered and informed                            consent was obtained.                           - ASA Grade Assessment: II - A patient with mild                            systemic disease.                           After obtaining informed consent, the colonoscope                            was passed under direct vision. Throughout the  procedure, the patient's blood pressure, pulse, and                            oxygen saturations were monitored continuously. The                            [id number]-H190DL ([id number]) scope was introduced through                            the anus and advanced to the the cecum, identified                            by appendiceal orifice and ileocecal valve. The                             colonoscopy was performed without difficulty. The                            patient tolerated the procedure well. The quality                            of the bowel preparation was excellent. Scope                            withdrawal time was 11 minutes. Scope In: 9:47:37 AM Scope Out: 10:05:19 AM Scope Withdrawal Time: 0 hours 11 minutes 5 seconds  Total Procedure Duration: 0 hours 17 minutes 42 seconds  Findings:      Hemorrhoids were found on perianal exam.      The colon (entire examined portion) appeared normal.      Non-bleeding external internal hemorrhoids were found during       retroflexion. The hemorrhoids were small. Impression:               - Hemorrhoids found on perianal exam.                           - The entire examined colon is normal.                           - Non-bleeding external internal hemorrhoids.                           - No specimens collected. Moderate Sedation:      Per Anesthesia Care Recommendation:           - Discharge patient to home (ambulatory).                           - Resume previous diet.                           - Repeat colonoscopy in 10 years for screening                            purposes. Procedure Code(s):        --- Professional ---  69629, Colonoscopy, flexible; diagnostic, including                            collection of specimen(s) by brushing or washing,                            when performed (separate procedure) Diagnosis Code(s):        --- Professional ---                           K64.4, Residual hemorrhoidal skin tags                           K64.8, Other hemorrhoids                           R10.9, Unspecified abdominal pain CPT copyright 2019 American Medical Association. All rights reserved. The codes documented in this report are preliminary and upon coder review may  be revised to meet current compliance requirements. [name], MD [name],  12/30/2020 10:17:04  AM This report has been signed electronically. Number of Addenda: 0

## 2020-12-30 NOTE — Transfer of Care (Signed)
Immediate Anesthesia Transfer of Care Note  Patient: Bruce Adkins  Procedure(s) Performed: ESOPHAGOGASTRODUODENOSCOPY (EGD) WITH PROPOFOL (N/A ) COLONOSCOPY WITH PROPOFOL (N/A ) BALLOON DILATION BIOPSY  Patient Location: PACU  Anesthesia Type:General  Level of Consciousness: drowsy  Airway & Oxygen Therapy: Patient Spontanous Breathing  Post-op Assessment: Report given to RN and Post -op Vital signs reviewed and stable  Post vital signs: Reviewed and stable  Last Vitals:  Vitals Value Taken Time  BP    Temp    Pulse    Resp    SpO2      Last Pain:  Vitals:   12/30/20 0924  TempSrc:   PainSc: 0-No pain      Patients Stated Pain Goal: 5 (12/30/20 0807)  Complications: No complications documented.

## 2020-12-30 NOTE — Op Note (Addendum)
[facility] Patient Name: Bruce Adkins Procedure Date: 12/30/2020 9:15 AM MRN: [id number] Date of Birth: [birth year] Attending MD: [name] ,  CSN: [id number] Age: 65 Admit Type: Outpatient Procedure:                Upper GI endoscopy Indications:              Diarrhea, Nausea Providers:                [name], Sheran Fava, Burke Keels, Technician Referring MD:              Medicines:                Monitored Anesthesia Care Complications:            No immediate complications. Estimated Blood Loss:     Estimated blood loss: none. Procedure:                Pre-Anesthesia Assessment:                           - Prior to the procedure, a History and Physical                            was performed, and patient medications, allergies                            and sensitivities were reviewed. The patient's                            tolerance of previous anesthesia was reviewed.                           - The risks and benefits of the procedure and the                            sedation options and risks were discussed with the                            patient. All questions were answered and informed                            consent was obtained.                           - ASA Grade Assessment: II - A patient with mild                            systemic disease.                           After obtaining informed consent, the endoscope was                            passed under direct vision. Throughout the  procedure, the patient's blood pressure, pulse, and                            oxygen saturations were monitored continuously. The                            GIF-H190 ([id number]) was introduced through the                            mouth, and advanced to the second part of duodenum.                            The upper GI endoscopy was accomplished without                             difficulty. The patient tolerated the procedure                            well. Scope In: 9:29:05 AM Scope Out: 9:40:29 AM Total Procedure Duration: 0 hours 11 minutes 24 seconds  Findings:      A non-obstructing Schatzki ring was found at the gastroesophageal       junction. This was initially disrupted with a cold forceps. A TTS       dilator was passed through the scope. Dilation with an 18-19-20 mm       balloon dilator was performed to 20 mm. Mucosal disruption was observed.      The entire examined stomach was normal.      The examined duodenum was normal. Biopsies were taken with a cold       forceps for histology. Impression:               - Non-obstructing Schatzki ring. Biopsied. Dilated.                           - Normal stomach.                           - Normal examined duodenum. Biopsied. Moderate Sedation:      Per Anesthesia Care Recommendation:           - Discharge patient to home (ambulatory).                           - Resume previous diet.                           - Await pathology results.                           - Continue famotidine 40 mg qday.                           - Start zofran as needed for nausea.                           - Schedule abdominal US.                           -  Continue Miralax daily Procedure Code(s):        --- Professional ---                           [phone], Esophagogastroduodenoscopy, flexible,                            transoral; with transendoscopic balloon dilation of                            esophagus (less than 30 mm diameter)                           43239, 59, Esophagogastroduodenoscopy, flexible,                            transoral; with biopsy, single or multiple Diagnosis Code(s):        --- Professional ---                           K22.2, Esophageal obstruction                           R19.7, Diarrhea, unspecified                           R11.0, Nausea CPT copyright 2019 American Medical Association. All  rights reserved. The codes documented in this report are preliminary and upon coder review may  be revised to meet current compliance requirements. [name], MD [name],  12/30/2020 10:11:01 AM This report has been signed electronically. Number of Addenda: 0

## 2020-12-30 NOTE — Interval H&P Note (Signed)
History and Physical Interval Note:  12/30/2020 8:35 AM Bruce Adkins is a 65 y.o. 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  Patient reports that he was having abdominal pain and diarrhea but this has now resolved.  States that he still feels nauseated on a daily basis, he is not currently taking the medications.  Denies having any vomiting.  Denies any melena, hematochezia, fever, chills or abdominal distention.  He is having regular bowel movements every day.  TTG IgA and TSH were normal.  Iron stores were also within normal limits.  BP (!) 167/97   Pulse 74   Temp 98.4 F (36.9 C) (Oral)   Resp 14   Ht 5\' 11"  (1.803 m)   Wt 89.8 kg   SpO2 99%   BMI 27.62 kg/m  GENERAL: The patient is AO x3, in no acute distress. HEENT: Head is normocephalic and atraumatic. EOMI are intact. Mouth is well hydrated and without lesions. NECK: Supple. No masses LUNGS: Clear to auscultation. No presence of rhonchi/wheezing/rales. Adequate chest expansion HEART: RRR, normal s1 and s2. ABDOMEN: Soft, nontender, no guarding, no peritoneal signs, and nondistended. BS +. No masses. EXTREMITIES: Without any cyanosis, clubbing, rash, lesions or edema. NEUROLOGIC: AOx3, no focal motor deficit. SKIN: no jaundice, no rashes  Bruce Adkins  has presented today for surgery, with the diagnosis of anemia, abd pain, screening.  The various methods of treatment have been discussed with the patient and family. After consideration of risks, benefits and other options for treatment, the patient has consented to  Procedure(s) with comments: ESOPHAGOGASTRODUODENOSCOPY (EGD) WITH PROPOFOL (N/A) - am COLONOSCOPY WITH PROPOFOL (N/A) as a surgical intervention.  The patient's history has been reviewed, patient examined, no change in status, stable for surgery.  I have reviewed the patient's chart and labs.  Questions were answered to the patient's  satisfaction.     Bruce Adkins

## 2020-12-31 LAB — SURGICAL PATHOLOGY

## 2020-12-31 MED FILL — Famotidine Tab 40 MG: ORAL | 30 days supply | Qty: 30 | Fill #0 | Status: AC

## 2020-12-31 NOTE — Telephone Encounter (Signed)
scheduled

## 2021-01-01 ENCOUNTER — Other Ambulatory Visit (HOSPITAL_COMMUNITY): Payer: Self-pay

## 2021-01-04 ENCOUNTER — Encounter (HOSPITAL_COMMUNITY): Payer: Self-pay | Admitting: Gastroenterology

## 2021-01-04 MED FILL — Losartan Potassium Tab 100 MG: ORAL | 30 days supply | Qty: 30 | Fill #0 | Status: AC

## 2021-01-05 ENCOUNTER — Other Ambulatory Visit (HOSPITAL_COMMUNITY): Payer: Self-pay

## 2021-01-06 ENCOUNTER — Other Ambulatory Visit (HOSPITAL_COMMUNITY): Payer: Self-pay

## 2021-01-06 MED FILL — Losartan Potassium Tab 100 MG: ORAL | 30 days supply | Qty: 30 | Fill #1 | Status: CN

## 2021-01-06 MED FILL — Tamsulosin HCl Cap 0.4 MG: ORAL | 30 days supply | Qty: 30 | Fill #0 | Status: AC

## 2021-01-07 ENCOUNTER — Ambulatory Visit (HOSPITAL_COMMUNITY)
Admission: RE | Admit: 2021-01-07 | Discharge: 2021-01-07 | Disposition: A | Discharge status: Home / Self Care | Payer: 59 | Source: Ambulatory Visit | Attending: Gastroenterology | Admitting: Gastroenterology

## 2021-01-07 DIAGNOSIS — N1330 Unspecified hydronephrosis: Secondary | ICD-10-CM | POA: Diagnosis not present

## 2021-01-07 DIAGNOSIS — R112 Nausea with vomiting, unspecified: Secondary | ICD-10-CM | POA: Insufficient documentation

## 2021-01-08 ENCOUNTER — Other Ambulatory Visit (HOSPITAL_COMMUNITY): Payer: Self-pay

## 2021-01-13 ENCOUNTER — Other Ambulatory Visit (HOSPITAL_COMMUNITY): Payer: Self-pay

## 2021-01-13 MED FILL — Apixaban Tab 5 MG: ORAL | 30 days supply | Qty: 60 | Fill #0 | Status: AC

## 2021-01-13 MED FILL — Famotidine Tab 40 MG: ORAL | 30 days supply | Qty: 30 | Fill #1 | Status: CN

## 2021-01-14 ENCOUNTER — Other Ambulatory Visit (HOSPITAL_COMMUNITY): Payer: Self-pay

## 2021-01-14 ENCOUNTER — Telehealth (HOSPITAL_COMMUNITY): Payer: Self-pay | Admitting: *Deleted

## 2021-01-14 NOTE — Telephone Encounter (Signed)
Received TC from wife stating that patient has been experiencing debilitating fatigue x 2 months and is requesting that f/u appointment be moved up to see Dr. Ellin Saba.  Advised that since this is a new issue that is not associated with DVT that was being treated here it would need to be addressed by PCP and if he feels it requires treatment by Dr. Ellin Saba they will send [place] a referral.  Verbalized understanding.

## 2021-01-19 ENCOUNTER — Other Ambulatory Visit: Payer: Self-pay

## 2021-01-19 ENCOUNTER — Encounter: Payer: Self-pay | Admitting: Urology

## 2021-01-19 ENCOUNTER — Ambulatory Visit (INDEPENDENT_AMBULATORY_CARE_PROVIDER_SITE_OTHER): Payer: 59 | Admitting: Urology

## 2021-01-19 VITALS — BP 161/86 | HR 86 | Temp 99.4°F | Ht 71.0 in | Wt 198.0 lb

## 2021-01-19 DIAGNOSIS — N401 Enlarged prostate with lower urinary tract symptoms: Secondary | ICD-10-CM

## 2021-01-19 DIAGNOSIS — N179 Acute kidney failure, unspecified: Secondary | ICD-10-CM | POA: Diagnosis not present

## 2021-01-19 DIAGNOSIS — N1330 Unspecified hydronephrosis: Secondary | ICD-10-CM | POA: Diagnosis not present

## 2021-01-19 DIAGNOSIS — N138 Other obstructive and reflux uropathy: Secondary | ICD-10-CM | POA: Diagnosis not present

## 2021-01-19 LAB — URINALYSIS, ROUTINE W REFLEX MICROSCOPIC
Bilirubin, UA: NEGATIVE
Glucose, UA: NEGATIVE
Ketones, UA: NEGATIVE
Leukocytes,UA: NEGATIVE
Nitrite, UA: NEGATIVE
Protein,UA: NEGATIVE
RBC, UA: NEGATIVE
Specific Gravity, UA: 1.01 (ref 1.005–1.030)
Urobilinogen, Ur: 0.2 mg/dL (ref 0.2–1.0)
pH, UA: 7 (ref 5.0–7.5)

## 2021-01-19 NOTE — Progress Notes (Signed)
Simple Catheter Placement  Due to urinary retention patient is present today for a foley cath placement.  Patient was cleaned and prepped in a sterile fashion with betadine. A 16 FR foley catheter was inserted, urine return was noted  >2057ml, urine was yellow in color.  The balloon was filled with 10cc of sterile water.  A leg bag was attached for drainage. Patient was also given a night bag to take home and was given instruction on how to change from one bag to another.  Patient was given instruction on proper catheter care.  Patient tolerated well, no complications were noted   Performed by: Shantele Reller, Lpn  Additional notes/ Follow up: Per Md note

## 2021-01-19 NOTE — Progress Notes (Signed)
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  He had abdominal ultrasound done recently for recurrent nausea with some vomiting.  Ultrasound revealed significant bilateral hydroureteronephrosis and bladder.  His creatinine which in December 2021 was one-point was 4.74.  March 2015.  His potassium was 4.9.  His hemoglobin which in late 2021 was 12.6 was most recently 10.5.  The patient has been treated for BPH for some time, first by Dr. Mirna Mires in [place].  He has been on tamsulosin.  Urinary symptoms have been significant for the past year or 2  IPSS 17, quality-of-life score 3.  IPSS Questionnaire (AUA-7): Over the past month.   1)  How often have you had a sensation of not emptying your bladder completely after you finish urinating?  1 - Less than 1 time in 5  2)  How often have you had to urinate again less than two hours after you finished urinating? 3 - About half the time  3)  How often have you found you stopped and started again several times when you urinated?  2 - Less than half the time  4) How difficult have you found it to postpone urination?  2 - Less than half the time  5) How often have you had a weak urinary stream?  2 - Less than half the time  6) How often have you had to push or strain to begin urination?  2 - Less than half the time  7) How many times did you most typically get up to urinate from the time you went to bed until the time you got up in the morning?  5 - 5+ times  Total score:  0-7 mildly symptomatic   8-19 moderately symptomatic   20-35 severely symptomatic     Performed on April 14.  Past Medical History:  Diagnosis Date  . Arthritis   . BPH with obstruction/lower urinary tract symptoms   . CKD (chronic kidney disease)   . DVT (deep venous thrombosis) (HCC) 2016   LLE  . GERD (gastroesophageal reflux disease)   . Hyperlipidemia   . Hypertension   . Panic attacks      Past Surgical History:  Procedure Laterality Date  . BALLOON DILATION  12/30/2020   Procedure: BALLOON DILATION;  Surgeon: Marguerita Merles, Reuel Boom, MD;  [facility]: [facility];  Service: Gastroenterology;;  . BIOPSY  12/30/2020   Procedure: BIOPSY;  Surgeon: Dolores Frame, MD;  Location: [facility];  Service: Gastroenterology;;  . COLONOSCOPY    . COLONOSCOPY WITH PROPOFOL N/A 12/30/2020   Procedure: COLONOSCOPY WITH PROPOFOL;  Surgeon: Dolores Frame, MD;  [facility]: [facility];  Service: Gastroenterology;  Laterality: N/A;  . ESOPHAGOGASTRODUODENOSCOPY (EGD) WITH PROPOFOL N/A 12/30/2020   Procedure: ESOPHAGOGASTRODUODENOSCOPY (EGD) WITH PROPOFOL;  Surgeon: Dolores Frame, MD;  Location: [facility];  Service: Gastroenterology;  Laterality: N/A;  am    Home Medications:  Allergies as of 01/19/2021   No Known Allergies     Medication List       Accurate as of January 19, 2021  3:05 PM. If you have any questions, ask your nurse or doctor.        ALPRAZolam 0.25 MG tablet Commonly known as: XANAX TAKE 1 TABLET (0.25 MG TOTAL) BY MOUTH AT BEDTIME. What changed:   how much to take  how to take this  when to take this  reasons to take this  atorvastatin 20 MG tablet Commonly known as: LIPITOR TAKE 1 TABLET BY MOUTH AT BEDTIME   atorvastatin 40 MG tablet Commonly known as: LIPITOR Take 40 mg by mouth at bedtime.   atorvastatin 40 MG tablet Commonly known as: LIPITOR TAKE 1 TABLET BY MOUTH AT BEDTIME.   Eliquis 5 MG Tabs tablet Generic drug: apixaban TAKE 1 TABLET (5 MG TOTAL) BY MOUTH 2 (TWO) TIMES DAILY. What changed: how much to take   famotidine 40 MG tablet Commonly known as: PEPCID TAKE 1 TABLET (40 MG TOTAL) BY MOUTH DAILY.   loratadine 10 MG tablet Commonly known as: CLARITIN TAKE 1 TABLET (10 MG TOTAL) BY MOUTH DAILY.   losartan 100 MG tablet Commonly known as: COZAAR TAKE 1 TABLET (100 MG TOTAL) BY MOUTH  DAILY.   ondansetron 4 MG tablet Commonly known as: Zofran Take 1 tablet (4 mg total) by mouth every 8 (eight) hours as needed for nausea or vomiting.   tamsulosin 0.4 MG Caps capsule Commonly known as: FLOMAX TAKE 1 CAPSULE BY MOUTH EVERY DAY   tamsulosin 0.4 MG Caps capsule Commonly known as: FLOMAX TAKE 1 CAPSULE (0.4 MG TOTAL) BY MOUTH DAILY.       Allergies: No Known Allergies  Family History  Problem Relation Age of Onset  . Heart failure Mother   . Hypertension Mother   . Anxiety disorder Mother   . Hypertension Father   . Cancer Brother     Social History:  reports that he has never smoked. He has never used smokeless tobacco. He reports previous drug use. He reports that he does not drink alcohol.  ROS: A complete review of systems was performed.  All systems are negative except for pertinent findings as noted.  Physical Exam:  Vital signs in last 24 hours: BP (!) 161/86   Pulse 86   Temp 99.4 F (37.4 C)   Ht 5\' 11"  (1.803 m)   Wt 198 lb (89.8 kg)   BMI 27.62 kg/m  Constitutional:  Alert and oriented, No acute distress Cardiovascular: Regular rate  Respiratory: Normal respiratory effort GI: Abdomen is soft, nontender, nondistended, no abdominal masses. No CVAT.  Genitourinary: Normal male phallus, testes are descended bilaterally and non-tender and without masses, scrotum is normal in appearance without lesions or masses, perineum is normal on inspection.  Prostate gland approximately 60 g, symmetrical, nonnodular, Lymphatic: No lymphadenopathy Neurologic: Grossly intact, no focal deficits Psychiatric: Normal mood and affect  I have reviewed prior pt notes  I have reviewed notes from referring/previous physicians  I have reviewed urinalysis results  I have independently reviewed prior imaging-- abdominal ultrasound  I have reviewed prior PSA results--PSA 2.2 in June, 2020  I have reviewed laboratory results  Impression/Assessment:  1.  BPH  with retention, significant  2.  Bilateral hydroureteronephrosis, most likely secondary to above.  He has significant hydronephrosis as well as renal insufficiency with his last creatinine 4.7.  This more than likely has explained his nausea  Plan:  1.  Foley catheter was placed today, well over 2 L of urine was obtained  2.  We will check both his PSA and repeat basic metabolic panel today  3.  I explained to him that more than likely he was experiencing blood in his urine over the next couple of days and postobstructive diuresis  4.  I will bring him back in about a week.  We will talk to him about self-catheterization  5.  He will eventually need urodynamic studies to  see if he has any recoverable bladder function  6.  I will set up an urgent consultation for him to see a nephrologist here in [place]

## 2021-01-19 NOTE — Progress Notes (Signed)
Urological Symptom Review  Patient is experiencing the following symptoms: Frequent urination Get up at night to urinate Stream starts and stops Trouble starting stream   Review of Systems  Gastrointestinal (upper)  : Nausea Indigestion/heartburn  Gastrointestinal (lower) : Negative for lower GI symptoms  Constitutional : Weight loss Fatigue  Skin: Negative for skin symptoms  Eyes: Negative for eye symptoms  Ear/Nose/Throat : Negative for Ear/Nose/Throat symptoms  Hematologic/Lymphatic: Negative for Hematologic/Lymphatic symptoms  Cardiovascular : Negative for cardiovascular symptoms  Respiratory : Negative for respiratory symptoms  Endocrine: Negative for endocrine symptoms  Musculoskeletal: Negative for musculoskeletal symptoms  Neurological: Headaches  Psychologic: Negative for psychiatric symptoms

## 2021-01-20 ENCOUNTER — Ambulatory Visit: Payer: 59 | Admitting: Internal Medicine

## 2021-01-20 LAB — BASIC METABOLIC PANEL
BUN/Creatinine Ratio: 9 — ABNORMAL LOW (ref 10–24)
BUN: 50 mg/dL — ABNORMAL HIGH (ref 8–27)
CO2: 19 mmol/L — ABNORMAL LOW (ref 20–29)
Calcium: 9.1 mg/dL (ref 8.6–10.2)
Chloride: 106 mmol/L (ref 96–106)
Creatinine, Ser: 5.67 mg/dL — ABNORMAL HIGH (ref 0.76–1.27)
Glucose: 83 mg/dL (ref 65–99)
Potassium: 5.8 mmol/L — ABNORMAL HIGH (ref 3.5–5.2)
Sodium: 144 mmol/L (ref 134–144)
eGFR: 10 mL/min/{1.73_m2} — ABNORMAL LOW (ref >59)

## 2021-01-20 LAB — PSA: Prostate Specific Ag, Serum: 2.6 ng/mL (ref 0.0–4.0)

## 2021-01-21 ENCOUNTER — Telehealth: Payer: Self-pay

## 2021-01-21 ENCOUNTER — Other Ambulatory Visit (HOSPITAL_COMMUNITY): Payer: Self-pay

## 2021-01-21 ENCOUNTER — Other Ambulatory Visit: Payer: Self-pay | Admitting: Urology

## 2021-01-21 DIAGNOSIS — N1330 Unspecified hydronephrosis: Secondary | ICD-10-CM

## 2021-01-21 MED FILL — Alprazolam Tab 0.25 MG: ORAL | 30 days supply | Qty: 30 | Fill #0 | Status: AC

## 2021-01-21 NOTE — Telephone Encounter (Signed)
I called patient and informed patient of repeat blood work needed.  appt scheduled for tomorrow with patient.

## 2021-01-21 NOTE — Telephone Encounter (Signed)
-----   Message from Marcine Matar, MD sent at 01/21/2021  2:39 PM EDT ----- Please call patient-his kidney function was a little bit worse when he first saw [place] 2 days ago.  I have put an order in for him to have a repeat basic metabolic panel.  That is to be done tomorrow.

## 2021-01-22 ENCOUNTER — Other Ambulatory Visit: Payer: Self-pay

## 2021-01-22 ENCOUNTER — Other Ambulatory Visit: Payer: Self-pay | Admitting: Nephrology

## 2021-01-22 ENCOUNTER — Other Ambulatory Visit: Payer: 59

## 2021-01-22 ENCOUNTER — Other Ambulatory Visit (HOSPITAL_COMMUNITY)
Admission: RE | Admit: 2021-01-22 | Discharge: 2021-01-22 | Disposition: A | Discharge status: Home / Self Care | Payer: 59 | Source: Ambulatory Visit | Attending: Nephrology | Admitting: Nephrology

## 2021-01-22 ENCOUNTER — Other Ambulatory Visit (HOSPITAL_COMMUNITY): Payer: Self-pay | Admitting: Nephrology

## 2021-01-22 ENCOUNTER — Other Ambulatory Visit (HOSPITAL_COMMUNITY): Payer: Self-pay

## 2021-01-22 DIAGNOSIS — Z79899 Other long term (current) drug therapy: Secondary | ICD-10-CM | POA: Diagnosis not present

## 2021-01-22 DIAGNOSIS — E872 Acidosis, unspecified: Secondary | ICD-10-CM

## 2021-01-22 DIAGNOSIS — D638 Anemia in other chronic diseases classified elsewhere: Secondary | ICD-10-CM

## 2021-01-22 DIAGNOSIS — R42 Dizziness and giddiness: Secondary | ICD-10-CM | POA: Diagnosis not present

## 2021-01-22 DIAGNOSIS — E875 Hyperkalemia: Secondary | ICD-10-CM

## 2021-01-22 DIAGNOSIS — N1832 Chronic kidney disease, stage 3b: Secondary | ICD-10-CM | POA: Diagnosis not present

## 2021-01-22 DIAGNOSIS — N179 Acute kidney failure, unspecified: Secondary | ICD-10-CM | POA: Diagnosis not present

## 2021-01-22 DIAGNOSIS — N170 Acute kidney failure with tubular necrosis: Secondary | ICD-10-CM

## 2021-01-22 LAB — RENAL FUNCTION PANEL
Albumin: 4.6 g/dL (ref 3.5–5.0)
Anion gap: 8 (ref 5–15)
BUN: 41 mg/dL — ABNORMAL HIGH (ref 8–23)
CO2: 23 mmol/L (ref 22–32)
Calcium: 9.5 mg/dL (ref 8.9–10.3)
Chloride: 106 mmol/L (ref 98–111)
Creatinine, Ser: 4.41 mg/dL — ABNORMAL HIGH (ref 0.61–1.24)
GFR, Estimated: 14 mL/min — ABNORMAL LOW (ref >60)
Glucose, Bld: 113 mg/dL — ABNORMAL HIGH (ref 70–99)
Phosphorus: 3.4 mg/dL (ref 2.5–4.6)
Potassium: 5.6 mmol/L — ABNORMAL HIGH (ref 3.5–5.1)
Sodium: 137 mmol/L (ref 135–145)

## 2021-01-22 MED ORDER — SODIUM POLYSTYRENE SULFONATE PO POWD
15.0000 g | ORAL | 0 refills | Status: DC
  Filled 2021-01-22: qty 120, 28d supply, fill #0

## 2021-01-25 ENCOUNTER — Ambulatory Visit (HOSPITAL_COMMUNITY)
Admission: RE | Admit: 2021-01-25 | Discharge: 2021-01-25 | Disposition: A | Discharge status: Home / Self Care | Payer: 59 | Source: Ambulatory Visit | Attending: Nephrology | Admitting: Nephrology

## 2021-01-25 ENCOUNTER — Other Ambulatory Visit: Payer: Self-pay

## 2021-01-25 DIAGNOSIS — N1832 Chronic kidney disease, stage 3b: Secondary | ICD-10-CM | POA: Diagnosis not present

## 2021-01-25 DIAGNOSIS — E875 Hyperkalemia: Secondary | ICD-10-CM | POA: Diagnosis not present

## 2021-01-25 DIAGNOSIS — N170 Acute kidney failure with tubular necrosis: Secondary | ICD-10-CM | POA: Diagnosis not present

## 2021-01-25 DIAGNOSIS — E872 Acidosis, unspecified: Secondary | ICD-10-CM

## 2021-01-25 DIAGNOSIS — D638 Anemia in other chronic diseases classified elsewhere: Secondary | ICD-10-CM | POA: Insufficient documentation

## 2021-01-25 DIAGNOSIS — N179 Acute kidney failure, unspecified: Secondary | ICD-10-CM | POA: Diagnosis not present

## 2021-01-25 DIAGNOSIS — N1330 Unspecified hydronephrosis: Secondary | ICD-10-CM | POA: Diagnosis not present

## 2021-01-25 DIAGNOSIS — Z79899 Other long term (current) drug therapy: Secondary | ICD-10-CM | POA: Diagnosis not present

## 2021-01-26 ENCOUNTER — Emergency Department (HOSPITAL_COMMUNITY): Payer: 59

## 2021-01-26 ENCOUNTER — Encounter (HOSPITAL_COMMUNITY): Payer: Self-pay | Admitting: *Deleted

## 2021-01-26 ENCOUNTER — Other Ambulatory Visit: Payer: Self-pay

## 2021-01-26 ENCOUNTER — Ambulatory Visit (INDEPENDENT_AMBULATORY_CARE_PROVIDER_SITE_OTHER): Payer: 59 | Admitting: Urology

## 2021-01-26 ENCOUNTER — Emergency Department (HOSPITAL_COMMUNITY)
Admission: EM | Admit: 2021-01-26 | Discharge: 2021-01-26 | Disposition: A | Discharge status: Home / Self Care | Payer: 59 | Attending: Emergency Medicine | Admitting: Emergency Medicine

## 2021-01-26 ENCOUNTER — Encounter: Payer: Self-pay | Admitting: Urology

## 2021-01-26 VITALS — BP 100/69 | HR 132 | Wt 187.0 lb

## 2021-01-26 DIAGNOSIS — N138 Other obstructive and reflux uropathy: Secondary | ICD-10-CM

## 2021-01-26 DIAGNOSIS — N401 Enlarged prostate with lower urinary tract symptoms: Secondary | ICD-10-CM | POA: Diagnosis not present

## 2021-01-26 DIAGNOSIS — R000 Tachycardia, unspecified: Secondary | ICD-10-CM | POA: Insufficient documentation

## 2021-01-26 DIAGNOSIS — I129 Hypertensive chronic kidney disease with stage 1 through stage 4 chronic kidney disease, or unspecified chronic kidney disease: Secondary | ICD-10-CM | POA: Insufficient documentation

## 2021-01-26 DIAGNOSIS — Z79899 Other long term (current) drug therapy: Secondary | ICD-10-CM | POA: Insufficient documentation

## 2021-01-26 DIAGNOSIS — Z7901 Long term (current) use of anticoagulants: Secondary | ICD-10-CM | POA: Insufficient documentation

## 2021-01-26 DIAGNOSIS — E876 Hypokalemia: Secondary | ICD-10-CM | POA: Diagnosis not present

## 2021-01-26 DIAGNOSIS — R079 Chest pain, unspecified: Secondary | ICD-10-CM | POA: Diagnosis not present

## 2021-01-26 DIAGNOSIS — N189 Chronic kidney disease, unspecified: Secondary | ICD-10-CM | POA: Diagnosis not present

## 2021-01-26 DIAGNOSIS — N289 Disorder of kidney and ureter, unspecified: Secondary | ICD-10-CM | POA: Diagnosis not present

## 2021-01-26 DIAGNOSIS — E875 Hyperkalemia: Secondary | ICD-10-CM | POA: Diagnosis not present

## 2021-01-26 DIAGNOSIS — N1330 Unspecified hydronephrosis: Secondary | ICD-10-CM | POA: Diagnosis not present

## 2021-01-26 LAB — CBC
HCT: 34 % — ABNORMAL LOW (ref 39.0–52.0)
Hemoglobin: 11.3 g/dL — ABNORMAL LOW (ref 13.0–17.0)
MCH: 31.6 pg (ref 26.0–34.0)
MCHC: 33.2 g/dL (ref 30.0–36.0)
MCV: 95 fL (ref 80.0–100.0)
Platelets: 250 10*3/uL (ref 150–400)
RBC: 3.58 MIL/uL — ABNORMAL LOW (ref 4.22–5.81)
RDW: 12.2 % (ref 11.5–15.5)
WBC: 6.5 10*3/uL (ref 4.0–10.5)
nRBC: 0 % (ref 0.0–0.2)

## 2021-01-26 LAB — BASIC METABOLIC PANEL
Anion gap: 8 (ref 5–15)
BUN: 44 mg/dL — ABNORMAL HIGH (ref 8–23)
CO2: 21 mmol/L — ABNORMAL LOW (ref 22–32)
Calcium: 9 mg/dL (ref 8.9–10.3)
Chloride: 105 mmol/L (ref 98–111)
Creatinine, Ser: 3.55 mg/dL — ABNORMAL HIGH (ref 0.61–1.24)
GFR, Estimated: 18 mL/min — ABNORMAL LOW (ref >60)
Glucose, Bld: 108 mg/dL — ABNORMAL HIGH (ref 70–99)
Potassium: 5.7 mmol/L — ABNORMAL HIGH (ref 3.5–5.1)
Sodium: 134 mmol/L — ABNORMAL LOW (ref 135–145)

## 2021-01-26 LAB — TROPONIN I (HIGH SENSITIVITY)
Troponin I (High Sensitivity): 3 ng/L (ref <18)
Troponin I (High Sensitivity): 3 ng/L (ref <18)

## 2021-01-26 MED ORDER — SODIUM POLYSTYRENE SULFONATE 15 GM/60ML PO SUSP
15.0000 g | Freq: Once | ORAL | Status: DC
  Filled 2021-01-26: qty 60

## 2021-01-26 MED ORDER — SODIUM ZIRCONIUM CYCLOSILICATE 5 G PO PACK
10.0000 g | PACK | Freq: Once | ORAL | Status: AC
  Administered 2021-01-26: 10 g via ORAL
  Filled 2021-01-26: qty 2

## 2021-01-26 NOTE — Progress Notes (Signed)

## 2021-01-26 NOTE — ED Triage Notes (Signed)
Dizzy and high heart rate

## 2021-01-26 NOTE — ED Provider Notes (Signed)
[facility] EMERGENCY DEPARTMENT Provider Note   CSN: [id number] Arrival date & time: 01/26/21  1513     History Chief Complaint  Patient presents with  . Tachycardia    Bruce Adkins is a 65 y.o. male.  Pt was seen by Urology today and sent to Ed for evaluation due to elevated heart rate.  Pt has renal failure and has recently had elevated potassium.  Pt took kayexelate on Saturday.  He is suppose to take again today.  Pt reports he has had some chest pain over the past 3 weeks   The history is provided by the patient. No language interpreter was used.       Past Medical History:  Diagnosis Date  . Arthritis   . BPH with obstruction/lower urinary tract symptoms   . CKD (chronic kidney disease)   . DVT (deep venous thrombosis) (HCC) 2016   LLE  . GERD (gastroesophageal reflux disease)   . Hyperlipidemia   . Hypertension   . Panic attacks     Patient Active Problem List   Diagnosis Date Noted  . Constipation 12/17/2020  . Early satiety 12/17/2020  . Abdominal pain 12/17/2020  . Anemia 12/17/2020  . BPH (benign prostatic hyperplasia) 10/21/2020  . Prediabetes 10/21/2020  . Recurrent deep vein thrombosis (DVT) (HCC) 03/13/2019  . HLD (hyperlipidemia) 11/01/2006  . Anxiety 11/01/2006  . Essential hypertension 11/01/2006  . Allergic rhinitis 11/01/2006  . GERD 11/01/2006  . OSTEOARTHRITIS 11/01/2006    Past Surgical History:  Procedure Laterality Date  . BALLOON DILATION  12/30/2020   Procedure: BALLOON DILATION;  Surgeon: Marguerita Merles, Reuel Boom, MD;  [facility]: [facility];  Service: Gastroenterology;;  . BIOPSY  12/30/2020   Procedure: BIOPSY;  Surgeon: Dolores Frame, MD;  [facility]: [facility];  Service: Gastroenterology;;  . COLONOSCOPY    . COLONOSCOPY WITH PROPOFOL N/A 12/30/2020   Procedure: COLONOSCOPY WITH PROPOFOL;  Surgeon: Dolores Frame, MD;  [facility]: [facility];  Service: Gastroenterology;  Laterality: N/A;  .  ESOPHAGOGASTRODUODENOSCOPY (EGD) WITH PROPOFOL N/A 12/30/2020   Procedure: ESOPHAGOGASTRODUODENOSCOPY (EGD) WITH PROPOFOL;  Surgeon: Dolores Frame, MD;  [facility]: [facility];  Service: Gastroenterology;  Laterality: N/A;  am       Family History  Problem Relation Age of Onset  . Heart failure Mother   . Hypertension Mother   . Anxiety disorder Mother   . Hypertension Father   . Cancer Brother     Social History   Tobacco Use  . Smoking status: Never Smoker  . Smokeless tobacco: Never Used  Vaping Use  . Vaping Use: Never used  Substance Use Topics  . Alcohol use: No  . Drug use: Not Currently    Home Medications Prior to Admission medications   Medication Sig Start Date End Date Taking? Authorizing Provider  acetaminophen (TYLENOL) 500 MG tablet Take 1,000 mg by mouth every 6 (six) hours as needed.   Yes [provider]  ALPRAZolam (XANAX) 0.25 MG tablet TAKE 1 TABLET (0.25 MG TOTAL) BY MOUTH AT BEDTIME. Patient taking differently: Take 0.25 mg by mouth at bedtime as needed for anxiety. 11/24/20 05/23/21 Yes Patel, Earlie Lou, MD  apixaban (ELIQUIS) 5 MG TABS tablet TAKE 1 TABLET (5 MG TOTAL) BY MOUTH 2 (TWO) TIMES DAILY. Patient taking differently: Take 5 mg by mouth 2 (two) times daily. 10/08/20 10/08/21 Yes Malachy Mood, MD  famotidine (PEPCID) 40 MG tablet TAKE 1 TABLET (40 MG TOTAL) BY MOUTH DAILY. 12/03/20  12/03/21 Yes Anabel Halon, MD  loratadine (CLARITIN) 10 MG tablet TAKE 1 TABLET (10 MG TOTAL) BY MOUTH DAILY. 10/21/20 10/21/21 Yes Patel, Earlie Lou, MD  losartan (COZAAR) 100 MG tablet TAKE 1 TABLET (100 MG TOTAL) BY MOUTH DAILY. 12/03/20 12/03/21 Yes Anabel Halon, MD  ondansetron (ZOFRAN) 4 MG tablet Take 1 tablet (4 mg total) by mouth every 8 (eight) hours as needed for nausea or vomiting. 12/30/20 12/30/21 Yes Dolores Frame, MD  SPS 15 GM/60ML suspension Take by mouth 2 (two) times a week. Take 60 mls twice weekly 01/22/21  Yes [provider]  tamsulosin (FLOMAX) 0.4 MG CAPS capsule TAKE 1 CAPSULE (0.4 MG TOTAL) BY MOUTH DAILY. 11/24/20 11/24/21 Yes Anabel Halon, MD  atorvastatin (LIPITOR) 20 MG tablet TAKE 1 TABLET BY MOUTH AT BEDTIME Patient not taking: Reported on 01/26/2021 04/21/20 04/21/21  Mirna Mires, MD  atorvastatin (LIPITOR) 40 MG tablet Take 40 mg by mouth at bedtime. Patient not taking: Reported on 01/26/2021 08/27/20   [provider]  atorvastatin (LIPITOR) 40 MG tablet TAKE 1 TABLET BY MOUTH AT BEDTIME. Patient not taking: Reported on 01/26/2021 08/27/20 08/27/21  Mirna Mires, MD  sodium polystyrene (KAYEXALATE) powder Take by mouth. Patient not taking: Reported on 01/26/2021 01/25/21 02/24/21  [provider]  tamsulosin (FLOMAX) 0.4 MG CAPS capsule TAKE 1 CAPSULE BY MOUTH EVERY DAY Patient not taking: Reported on 01/26/2021 04/21/20 04/21/21  Mirna Mires, MD    Allergies    Patient has no known allergies.  Review of Systems   Review of Systems  All other systems reviewed and are negative.   Physical Exam Updated Vital Signs BP 121/84   Pulse 83   Temp 99.1 F (37.3 C) (Oral)   Resp 15   SpO2 100%   Physical Exam Vitals and nursing note reviewed.  Constitutional:      Appearance: He is well-developed.  HENT:     Head: Normocephalic and atraumatic.  Eyes:     Conjunctiva/sclera: Conjunctivae normal.  Cardiovascular:     Rate and Rhythm: Regular rhythm. Tachycardia present.     Heart sounds: No murmur heard.   Pulmonary:     Effort: Pulmonary effort is normal. No respiratory distress.     Breath sounds: Normal breath sounds.  Abdominal:     Palpations: Abdomen is soft.     Tenderness: There is no abdominal tenderness.  Musculoskeletal:     Cervical back: Neck supple.  Skin:    General: Skin is warm and dry.  Neurological:     Mental Status: He is alert.  Psychiatric:        Mood and Affect: Mood normal.     ED Results / Procedures / Treatments   Labs (all labs  ordered are listed, but only abnormal results are displayed) Labs Reviewed  BASIC METABOLIC PANEL - Abnormal; Notable for the following components:      Result Value   Sodium 134 (*)    Potassium 5.7 (*)    CO2 21 (*)    Glucose, Bld 108 (*)    BUN 44 (*)    Creatinine, Ser 3.55 (*)    GFR, Estimated 18 (*)    All other components within normal limits  CBC - Abnormal; Notable for the following components:   RBC 3.58 (*)    Hemoglobin 11.3 (*)    HCT 34.0 (*)    All other components within normal limits  TROPONIN I (HIGH SENSITIVITY)  TROPONIN I (  HIGH SENSITIVITY)    EKG EKG Interpretation  Date/Time:  Tuesday Jan 26 2021 15:22:04 EDT Ventricular Rate:  109 PR Interval:  146 QRS Duration: 86 QT Interval:  302 QTC Calculation: 406 R Axis:   72 Text Interpretation: Sinus tachycardia Nonspecific ST abnormality No previous tracing Confirmed by Cathren Laine ([id number]) on 01/26/2021 3:30:39 PM   Radiology DG Chest 2 View  Result Date: 01/26/2021 CLINICAL DATA:  Chest pain 3 days EXAM: CHEST - 2 VIEW COMPARISON:  None. FINDINGS: The heart size and mediastinal contours are within normal limits. Both lungs are clear. The visualized skeletal structures are unremarkable. IMPRESSION: No active cardiopulmonary disease. Electronically Signed   By: Marlan Palau M.D.   On: 01/26/2021 15:54   US RENAL  Result Date: 01/26/2021 CLINICAL DATA:  Acute renal failure. EXAM: RENAL / URINARY TRACT ULTRASOUND COMPLETE COMPARISON:  Ultrasound 01/07/2021. FINDINGS: Right Kidney: Renal measurements: 10.0 x 5.9 x 4.5 cm. = volume: 136.3 mL. Echogenicity within normal limits. No mass. Significant improvement of right hydronephrosis with mild residual. Left Kidney: Renal measurements: 10.6 x 5.8 x 5.8 cm = volume: 186.2 mL. Echogenicity within normal limits. 3.0 cm simple cyst. Significant improvement of left hydronephrosis with moderate residual. Bladder: Bladder is nondistended. Foley catheter in bladder.  Bladder wall is thickened, although this may be secondary to collapsed bladder, bladder wall pathology cannot be excluded. Other: None. IMPRESSION: 1. Significant interim improvement of right hydronephrosis with mild residual. Significant interim improvement of left hydronephrosis mild residual. 2. Bladder is nondistended. Foley catheter in bladder. Bladder wall is thickened although this may be secondary to collapsed bladder, bladder wall pathology cannot be excluded. Electronically Signed   By: Maisie Fus  Register   On: 01/26/2021 08:50    Procedures Procedures   Medications Ordered in ED Medications - No data to display  ED Course  I have reviewed the triage vital signs and the nursing notes.  Pertinent labs & imaging results that were available during my care of the patient were reviewed by me and considered in my medical decision making (see chart for details).  Clinical Course as of 01/26/21 1957  Tue Jan 26, 2021  1955 Troponin I (High Sensitivity) [LS]  1956 Troponin I (High Sensitivity) [LS]    Clinical Course User Index [LS] Elson Areas, PA-C   MDM Rules/Calculators/A&P                          Pt has elevated k to 5.7.  Pt is suppose to take kayexalate today.  Pt is scheduled to follow up with nephrology.  Troponin negative x 2  I discussed with Dr. Lacy Duverney who advised follow up with Dr. Wolfgang Phoenix,  lokelmia here  Final Clinical Impression(s) / ED Diagnoses Final diagnoses:  Tachycardia  Hyperkalemia    Rx / DC Orders ED Discharge Orders    None    An After Visit Summary was printed and given to the patient.    Osie Cheeks 01/26/21 2013    Cathren Laine, MD 01/27/21 [phone]

## 2021-01-26 NOTE — ED Notes (Signed)
Patient has foley bag in place on arrival that urologists placed prior to arrival to ED today. Patient educated on switching from leg bag to standard drainage bag at night.

## 2021-01-26 NOTE — Progress Notes (Signed)
History of Present Illness: Here for followup of BPH w/ obstruction/bilateral hydronephrosis.  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  He had abdominal ultrasound done recently for recurrent nausea with some vomiting.  Ultrasound revealed significant bilateral hydroureteronephrosis and bladder.  His creatinine which in December 2021 was one-point was 4.74.  March 2015.  His potassium was 4.9.  His hemoglobin which in late 2021 was 12.6 was most recently 10.5.  The patient has been treated for BPH for some time, first by Dr. Mirna Mires in [place].  He has been on tamsulosin.  Urinary symptoms have been significant for the past year or 2  IPSS 17, quality-of-life score 3. Foley placed, > 2L urine returned.  5.3.2022: He is here today for follow-up.  He does have an occasional urge to go with the catheter in place.  His hematuria which he experienced directly after catheterization has cleared.  His PSA checked at his last visit was normal, under 3.  His creatinine did come down some when he saw the nephrologist, Dr. Wolfgang Phoenix.  He has experienced lightheadedness.  He is off his losartan.  2 to 3 days ago he did have anterior chest pain with some tightness.  He did not seek medical attention for this.  Past Medical History:  Diagnosis Date  . Arthritis   . BPH with obstruction/lower urinary tract symptoms   . CKD (chronic kidney disease)   . DVT (deep venous thrombosis) (HCC) 2016   LLE  . GERD (gastroesophageal reflux disease)   . Hyperlipidemia   . Hypertension   . Panic attacks     Past Surgical History:  Procedure Laterality Date  . BALLOON DILATION  12/30/2020   Procedure: BALLOON DILATION;  Surgeon: Marguerita Merles, Reuel Boom, MD;  [facility]: [facility];  Service: Gastroenterology;;  . BIOPSY  12/30/2020   Procedure: BIOPSY;  Surgeon: Dolores Frame, MD;  [facility];  Service:  Gastroenterology;;  . COLONOSCOPY    . COLONOSCOPY WITH PROPOFOL N/A 12/30/2020   Procedure: COLONOSCOPY WITH PROPOFOL;  Surgeon: Dolores Frame, MD;  [facility];  Service: Gastroenterology;  Laterality: N/A;  . ESOPHAGOGASTRODUODENOSCOPY (EGD) WITH PROPOFOL N/A 12/30/2020   Procedure: ESOPHAGOGASTRODUODENOSCOPY (EGD) WITH PROPOFOL;  Surgeon: Dolores Frame, MD;  [facility]: [facility];  Service: Gastroenterology;  Laterality: N/A;  am    Home Medications:  Allergies as of 01/26/2021   No Known Allergies     Medication List       Accurate as of Jan 26, 2021  8:20 AM. If you have any questions, ask your nurse or doctor.        ALPRAZolam 0.25 MG tablet Commonly known as: XANAX TAKE 1 TABLET (0.25 MG TOTAL) BY MOUTH AT BEDTIME. What changed:   how much to take  how to take this  when to take this  reasons to take this   atorvastatin 20 MG tablet Commonly known as: LIPITOR TAKE 1 TABLET BY MOUTH AT BEDTIME   atorvastatin 40 MG tablet Commonly known as: LIPITOR Take 40 mg by mouth at bedtime.   atorvastatin 40 MG tablet Commonly known as: LIPITOR TAKE 1 TABLET BY MOUTH AT BEDTIME.   Eliquis 5 MG Tabs tablet Generic drug: apixaban TAKE 1 TABLET (5 MG TOTAL) BY MOUTH 2 (TWO) TIMES DAILY. What changed: how much to take   famotidine 40 MG tablet Commonly known as: PEPCID TAKE 1 TABLET (40 MG TOTAL) BY MOUTH DAILY.  loratadine 10 MG tablet Commonly known as: CLARITIN TAKE 1 TABLET (10 MG TOTAL) BY MOUTH DAILY.   losartan 100 MG tablet Commonly known as: COZAAR TAKE 1 TABLET (100 MG TOTAL) BY MOUTH DAILY.   ondansetron 4 MG tablet Commonly known as: Zofran Take 1 tablet (4 mg total) by mouth every 8 (eight) hours as needed for nausea or vomiting.   tamsulosin 0.4 MG Caps capsule Commonly known as: FLOMAX TAKE 1 CAPSULE BY MOUTH EVERY DAY   tamsulosin 0.4 MG Caps capsule Commonly known as: FLOMAX TAKE 1 CAPSULE (0.4 MG TOTAL)  BY MOUTH DAILY.       Allergies: No Known Allergies  Family History  Problem Relation Age of Onset  . Heart failure Mother   . Hypertension Mother   . Anxiety disorder Mother   . Hypertension Father   . Cancer Brother     Social History:  reports that he has never smoked. He has never used smokeless tobacco. He reports previous drug use. He reports that he does not drink alcohol.  ROS: A complete review of systems was performed.  All systems are negative except for pertinent findings as noted.  Physical Exam:  Vital signs in last 24 hours: There were no vitals taken for this visit. Constitutional:  Alert and oriented, No acute distress Cardiovascular: Tachycardic Respiratory: Normal respiratory effort Neurologic: Grossly intact, no focal deficits Psychiatric: Normal mood and affect  I have reviewed prior pt notes  I have reviewed notes from referring/previous physicians  I have reviewed urinalysis results  I have independently reviewed prior imaging  I have reviewed prior PSA/BMP results    Impression/Assessment:  1.  BPH with retention and hydronephrosis.  Hydronephrosis has resolved on recent renal ultrasound.  He still has a catheter in.  PSA normal.  2.  Bilateral hydronephrosis, resolved with catheter placement.  Associated acute renal failure.  3.  Recurrent tachycardia with recent history of chest pain  Plan:  1.  I recommended that the patient go to the emergency room to be checked out regarding his cardiac status  2.  I did discuss with him further management of his retention-learn self-catheterization now versus 2 more weeks of catheter placement with trial of voiding then.  He would like to wait 2 more weeks  3.  It is okay to stop the tamsulosin at the current time, but restart 2 to 3 days before his trial of voiding

## 2021-01-28 ENCOUNTER — Other Ambulatory Visit (HOSPITAL_COMMUNITY): Payer: Self-pay

## 2021-01-28 DIAGNOSIS — R7303 Prediabetes: Secondary | ICD-10-CM | POA: Diagnosis not present

## 2021-01-28 DIAGNOSIS — E872 Acidosis: Secondary | ICD-10-CM | POA: Diagnosis not present

## 2021-01-28 DIAGNOSIS — R809 Proteinuria, unspecified: Secondary | ICD-10-CM | POA: Diagnosis not present

## 2021-01-28 DIAGNOSIS — E875 Hyperkalemia: Secondary | ICD-10-CM | POA: Diagnosis not present

## 2021-01-28 DIAGNOSIS — N1832 Chronic kidney disease, stage 3b: Secondary | ICD-10-CM | POA: Diagnosis not present

## 2021-01-28 DIAGNOSIS — D638 Anemia in other chronic diseases classified elsewhere: Secondary | ICD-10-CM | POA: Diagnosis not present

## 2021-01-28 DIAGNOSIS — E211 Secondary hyperparathyroidism, not elsewhere classified: Secondary | ICD-10-CM | POA: Diagnosis not present

## 2021-01-28 DIAGNOSIS — N170 Acute kidney failure with tubular necrosis: Secondary | ICD-10-CM | POA: Diagnosis not present

## 2021-01-28 DIAGNOSIS — R768 Other specified abnormal immunological findings in serum: Secondary | ICD-10-CM | POA: Diagnosis not present

## 2021-01-28 MED ORDER — SODIUM POLYSTYRENE SULFONATE PO POWD
ORAL | 2 refills | Status: DC
  Filled 2021-01-28: qty 180, 28d supply, fill #0

## 2021-01-28 MED ORDER — SODIUM POLYSTYRENE SULFONATE 15 GM/60ML PO SUSP
ORAL | 2 refills | Status: DC
  Filled 2021-01-28: qty 720, 28d supply, fill #0
  Filled 2021-01-28: qty 720, 4d supply, fill #0
  Filled 2021-01-29: qty 720, 28d supply, fill #0

## 2021-01-28 MED ORDER — SODIUM BICARBONATE 650 MG PO TABS
650.0000 mg | ORAL_TABLET | Freq: Three times a day (TID) | ORAL | 2 refills | Status: DC
  Filled 2021-01-28: qty 90, 30d supply, fill #0

## 2021-01-29 ENCOUNTER — Other Ambulatory Visit (HOSPITAL_COMMUNITY): Payer: Self-pay

## 2021-02-08 NOTE — Progress Notes (Signed)
History of Present Illness: Here for followup of BPH w/ obstruction/bilateral hydronephrosis.  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 He had abdominal ultrasound done recently for recurrent nausea with some vomiting. Ultrasound revealed significant bilateral hydroureteronephrosis and bladder. His creatinine which in December 2021 was one-point was 4.74. March 2015. His potassium was 4.9. His hemoglobin which in late 2021 was 12.6 was most recently 10.5.  The patient has been treated for BPH for some time, first by Dr. Mirna Mires in [place]. He has been on tamsulosin. Urinary symptoms have been significant for the past year or 2  IPSS 17, quality-of-life score 3. Foley placed, > 2L urine returned.  5.3.2022: He is here today for follow-up.  He does have an occasional urge to go with the catheter in place.  His hematuria which he experienced directly after catheterization has cleared.  His PSA checked at his last visit was normal, under 3.  His creatinine did come down some when he saw the nephrologist, Dr. Wolfgang Phoenix.  He has experienced lightheadedness.  He is off his losartan.  2 to 3 days ago he did have anterior chest pain with some tightness.  He did not seek medical attention for this.  5.17.2022: He was seen in the emergency room for his lightheadedness, sent home after evaluation.  He has not been on his tamsulosin.  His voiding trial was scheduled for today.  Past Medical History:  Diagnosis Date  . Arthritis   . BPH with obstruction/lower urinary tract symptoms   . CKD (chronic kidney disease)   . DVT (deep venous thrombosis) (HCC) 2016   LLE  . GERD (gastroesophageal reflux disease)   . Hyperlipidemia   . Hypertension   . Panic attacks     Past Surgical History:  Procedure Laterality Date  . BALLOON DILATION  12/30/2020   Procedure: BALLOON DILATION;  Surgeon: Marguerita Merles, Reuel Boom, MD;   [facility];  Service: Gastroenterology;;  . BIOPSY  12/30/2020   Procedure: BIOPSY;  Surgeon: Dolores Frame, MD;  [facility];  Service: Gastroenterology;;  . COLONOSCOPY    . COLONOSCOPY WITH PROPOFOL N/A 12/30/2020   Procedure: COLONOSCOPY WITH PROPOFOL;  Surgeon: Dolores Frame, MD;  [facility];  Service: Gastroenterology;  Laterality: N/A;  . ESOPHAGOGASTRODUODENOSCOPY (EGD) WITH PROPOFOL N/A 12/30/2020   Procedure: ESOPHAGOGASTRODUODENOSCOPY (EGD) WITH PROPOFOL;  Surgeon: Dolores Frame, MD;  [facility];  Service: Gastroenterology;  Laterality: N/A;  am    Home Medications:  Allergies as of 02/09/2021   No Known Allergies     Medication List       Accurate as of Feb 08, 2021  7:40 PM. If you have any questions, ask your nurse or doctor.        acetaminophen 500 MG tablet Commonly known as: TYLENOL Take 1,000 mg by mouth every 6 (six) hours as needed.   ALPRAZolam 0.25 MG tablet Commonly known as: XANAX TAKE 1 TABLET (0.25 MG TOTAL) BY MOUTH AT BEDTIME. What changed:   how much to take  how to take this  when to take this  reasons to take this   atorvastatin 20 MG tablet Commonly known as: LIPITOR TAKE 1 TABLET BY MOUTH AT BEDTIME   atorvastatin 40 MG tablet Commonly known as: LIPITOR Take 40 mg by mouth at bedtime.   atorvastatin 40 MG tablet Commonly known as: LIPITOR TAKE 1 TABLET BY MOUTH AT BEDTIME.  Eliquis 5 MG Tabs tablet Generic drug: apixaban TAKE 1 TABLET (5 MG TOTAL) BY MOUTH 2 (TWO) TIMES DAILY. What changed: how much to take   famotidine 40 MG tablet Commonly known as: PEPCID TAKE 1 TABLET (40 MG TOTAL) BY MOUTH DAILY.   loratadine 10 MG tablet Commonly known as: CLARITIN TAKE 1 TABLET (10 MG TOTAL) BY MOUTH DAILY.   losartan 100 MG tablet Commonly known as: COZAAR TAKE 1 TABLET (100 MG TOTAL) BY MOUTH DAILY.   ondansetron 4 MG tablet Commonly known as:  Zofran Take 1 tablet (4 mg total) by mouth every 8 (eight) hours as needed for nausea or vomiting.   sodium bicarbonate 650 MG tablet Take 1 tablet (650 mg total) by mouth in the morning, in the evening, and at bedtime.   SPS 15 GM/60ML suspension Generic drug: sodium polystyrene Take by mouth 2 (two) times a week. Take 60 mls twice weekly   sodium polystyrene powder Commonly known as: KAYEXALATE Take by mouth.   sodium polystyrene 15 GM/60ML suspension Commonly known as: KAYEXALATE Take 45ml's by mouth 3 times each week   tamsulosin 0.4 MG Caps capsule Commonly known as: FLOMAX TAKE 1 CAPSULE BY MOUTH EVERY DAY   tamsulosin 0.4 MG Caps capsule Commonly known as: FLOMAX TAKE 1 CAPSULE (0.4 MG TOTAL) BY MOUTH DAILY.       Allergies: No Known Allergies  Family History  Problem Relation Age of Onset  . Heart failure Mother   . Hypertension Mother   . Anxiety disorder Mother   . Hypertension Father   . Cancer Brother     Social History:  reports that he has never smoked. He has never used smokeless tobacco. He reports previous drug use. He reports that he does not drink alcohol.  ROS: A complete review of systems was performed.  All systems are negative except for pertinent findings as noted.  Physical Exam:  Vital signs in last 24 hours: There were no vitals taken for this visit. Constitutional:  Alert and oriented, No acute distress Cardiovascular: Regular rate  Respiratory: Normal respiratory effort Neurologic: Grossly intact, no focal deficits Psychiatric: Normal mood and affect  I have reviewed prior pt notes  I have reviewed prior PSA results  I have reviewed prior urine culture  Emergency room notes reviewed    Impression/Assessment:  1.  BPH with retention, on catheter currently.  He did not get back on his alpha-blocker so no voiding trial today  2.  Hydronephrosis secondary to above, treated with bladder drainage  Plan:  1.  I taught him how  to plug and unplug his catheter  2.  He will go back on his tamsulosin  3.  He will come back in 1 week for nurse visit/voiding trial.  If he fails, consider [facility]

## 2021-02-09 ENCOUNTER — Other Ambulatory Visit: Payer: Self-pay

## 2021-02-09 ENCOUNTER — Encounter: Payer: Self-pay | Admitting: Urology

## 2021-02-09 ENCOUNTER — Ambulatory Visit: Payer: 59 | Admitting: Urology

## 2021-02-09 VITALS — BP 137/83 | HR 78

## 2021-02-09 DIAGNOSIS — R809 Proteinuria, unspecified: Secondary | ICD-10-CM | POA: Diagnosis not present

## 2021-02-09 DIAGNOSIS — N170 Acute kidney failure with tubular necrosis: Secondary | ICD-10-CM | POA: Diagnosis not present

## 2021-02-09 DIAGNOSIS — N179 Acute kidney failure, unspecified: Secondary | ICD-10-CM

## 2021-02-09 DIAGNOSIS — E211 Secondary hyperparathyroidism, not elsewhere classified: Secondary | ICD-10-CM | POA: Diagnosis not present

## 2021-02-09 DIAGNOSIS — N1330 Unspecified hydronephrosis: Secondary | ICD-10-CM

## 2021-02-09 DIAGNOSIS — D638 Anemia in other chronic diseases classified elsewhere: Secondary | ICD-10-CM | POA: Diagnosis not present

## 2021-02-09 DIAGNOSIS — R7303 Prediabetes: Secondary | ICD-10-CM | POA: Diagnosis not present

## 2021-02-09 DIAGNOSIS — R768 Other specified abnormal immunological findings in serum: Secondary | ICD-10-CM | POA: Diagnosis not present

## 2021-02-09 DIAGNOSIS — N401 Enlarged prostate with lower urinary tract symptoms: Secondary | ICD-10-CM | POA: Diagnosis not present

## 2021-02-09 DIAGNOSIS — E875 Hyperkalemia: Secondary | ICD-10-CM | POA: Diagnosis not present

## 2021-02-09 DIAGNOSIS — N138 Other obstructive and reflux uropathy: Secondary | ICD-10-CM

## 2021-02-09 DIAGNOSIS — N289 Disorder of kidney and ureter, unspecified: Secondary | ICD-10-CM

## 2021-02-09 DIAGNOSIS — E872 Acidosis: Secondary | ICD-10-CM | POA: Diagnosis not present

## 2021-02-09 DIAGNOSIS — N1832 Chronic kidney disease, stage 3b: Secondary | ICD-10-CM | POA: Diagnosis not present

## 2021-02-12 ENCOUNTER — Other Ambulatory Visit (HOSPITAL_COMMUNITY): Payer: Self-pay

## 2021-02-12 DIAGNOSIS — D638 Anemia in other chronic diseases classified elsewhere: Secondary | ICD-10-CM | POA: Diagnosis not present

## 2021-02-12 DIAGNOSIS — R55 Syncope and collapse: Secondary | ICD-10-CM | POA: Diagnosis not present

## 2021-02-12 DIAGNOSIS — N170 Acute kidney failure with tubular necrosis: Secondary | ICD-10-CM | POA: Diagnosis not present

## 2021-02-12 DIAGNOSIS — E872 Acidosis: Secondary | ICD-10-CM | POA: Diagnosis not present

## 2021-02-12 DIAGNOSIS — N1832 Chronic kidney disease, stage 3b: Secondary | ICD-10-CM | POA: Diagnosis not present

## 2021-02-12 MED ORDER — SODIUM POLYSTYRENE SULFONATE PO POWD
15.0000 g | ORAL | 0 refills | Status: DC
  Filled 2021-02-12: qty 60, 28d supply, fill #0

## 2021-02-12 MED ORDER — SODIUM BICARBONATE 650 MG PO TABS
650.0000 mg | ORAL_TABLET | Freq: Every day | ORAL | 3 refills | Status: DC
  Filled 2021-02-12: qty 90, 90d supply, fill #0

## 2021-02-15 ENCOUNTER — Other Ambulatory Visit (HOSPITAL_COMMUNITY): Payer: Self-pay

## 2021-02-16 ENCOUNTER — Other Ambulatory Visit (HOSPITAL_COMMUNITY): Payer: Self-pay

## 2021-02-16 MED FILL — Tamsulosin HCl Cap 0.4 MG: ORAL | 30 days supply | Qty: 30 | Fill #1 | Status: AC

## 2021-02-18 ENCOUNTER — Ambulatory Visit: Payer: 59

## 2021-02-21 ENCOUNTER — Other Ambulatory Visit: Payer: Self-pay | Admitting: Internal Medicine

## 2021-02-21 ENCOUNTER — Other Ambulatory Visit (HOSPITAL_COMMUNITY): Payer: Self-pay | Admitting: Hematology

## 2021-02-21 DIAGNOSIS — N400 Enlarged prostate without lower urinary tract symptoms: Secondary | ICD-10-CM

## 2021-02-21 MED FILL — Losartan Potassium Tab 100 MG: ORAL | 30 days supply | Qty: 30 | Fill #1 | Status: AC

## 2021-02-23 ENCOUNTER — Other Ambulatory Visit (HOSPITAL_COMMUNITY): Payer: Self-pay

## 2021-02-23 MED ORDER — TAMSULOSIN HCL 0.4 MG PO CAPS
0.4000 mg | ORAL_CAPSULE | Freq: Every day | ORAL | 2 refills | Status: DC
  Filled 2021-02-23 – 2021-03-18 (×2): qty 30, 30d supply, fill #0
  Filled 2021-03-26: qty 90, 90d supply, fill #0

## 2021-02-24 ENCOUNTER — Other Ambulatory Visit (HOSPITAL_COMMUNITY): Payer: Self-pay

## 2021-02-24 MED ORDER — ELIQUIS 5 MG PO TABS
ORAL_TABLET | Freq: Two times a day (BID) | ORAL | 3 refills | Status: DC
  Filled 2021-02-24: qty 60, 30d supply, fill #0
  Filled 2021-04-01: qty 60, 30d supply, fill #1
  Filled 2021-05-17: qty 60, 30d supply, fill #2
  Filled 2021-07-07: qty 60, 30d supply, fill #3

## 2021-02-24 NOTE — Telephone Encounter (Signed)
This is your patient.

## 2021-03-02 ENCOUNTER — Ambulatory Visit: Payer: 59 | Admitting: Urology

## 2021-03-03 ENCOUNTER — Other Ambulatory Visit: Payer: Self-pay

## 2021-03-03 ENCOUNTER — Ambulatory Visit (INDEPENDENT_AMBULATORY_CARE_PROVIDER_SITE_OTHER): Payer: 59

## 2021-03-03 DIAGNOSIS — N138 Other obstructive and reflux uropathy: Secondary | ICD-10-CM | POA: Diagnosis not present

## 2021-03-03 DIAGNOSIS — N401 Enlarged prostate with lower urinary tract symptoms: Secondary | ICD-10-CM

## 2021-03-03 NOTE — Patient Instructions (Signed)

## 2021-03-03 NOTE — Progress Notes (Signed)
Fill and Pull Catheter Removal  Patient is present today for a catheter removal.  Patient was cleaned and prepped in a sterile fashion of sterile water/ saline was instilled into the bladder when the patient felt the urge to urinate. of water was then drained from the balloon.  A 16FR foley cath was removed from the bladder no complications were noted .  Patient as then given some time to void on their own.   Performed by: Marchelle Folks RN  Follow up/ Additional notes patient will return this afternoon to reassess if able to void.

## 2021-03-03 NOTE — Progress Notes (Signed)
Patient called @ 2:27pm stating he could not make it at 1:30 today. Patient states he has been drinking plenty of fluids and still has not voided. Patient asked if he could make it to office before 4 pm today. Patient states yes, he should be able to make it before 4.   post void residual= 974  Simple Catheter Placement  Due to urinary retention patient is present today for a foley cath placement.  Patient was cleaned and prepped in a sterile fashion with betadine. A 16 FR foley catheter was inserted, urine return was noted  1200 ml, urine was yellow in color.  The balloon was filled with 10cc of sterile water.  A cap was attached for drainage.  Patient was given instruction on proper catheter care.  Patient tolerated well, no complications were noted   Performed by: Larue Lightner, LPN  Additional notes/ Follow up: keep next scheduled OV

## 2021-03-09 ENCOUNTER — Other Ambulatory Visit (HOSPITAL_COMMUNITY): Payer: Self-pay

## 2021-03-09 DIAGNOSIS — I82409 Acute embolism and thrombosis of unspecified deep veins of unspecified lower extremity: Secondary | ICD-10-CM

## 2021-03-10 ENCOUNTER — Inpatient Hospital Stay (HOSPITAL_COMMUNITY): Payer: 59 | Attending: Hematology

## 2021-03-10 ENCOUNTER — Encounter (HOSPITAL_COMMUNITY): Payer: Self-pay

## 2021-03-10 DIAGNOSIS — R000 Tachycardia, unspecified: Secondary | ICD-10-CM | POA: Insufficient documentation

## 2021-03-10 NOTE — Progress Notes (Addendum)
Cardiology Office Note   Date:  03/11/2021   ID:  ISAIAHS Adkins, DOB 1956-06-12, MRN [id number]  PCP:  Anabel Halon, MD  Cardiologist:   Rollene Rotunda, MD Referring:  Randa Lynn, MD   Chief Complaint  Patient presents with   Dizziness    .   History of Present Illness: Bruce Adkins is a 65 y.o. male who presents for Randa Lynn, MD    He was in the ED for tachycardia last month.  I reviewed these records for this visit.   He was actually treated for hyperkalemia.    He was told to go to the ED because of 2 episodes of near passing out.  This happened twice in the last month although he has not had any in the last week.  The first time he was sleeping and he got up and put his feet on the floor.  He felt sweaty and nauseated and he had to go back down to the bed.  He did not actually lose consciousness.  The second episode was very similar.  He has been battling renal insufficiency thought ultimately to be related to a prostate problem and currently has an indwelling Foley.  He has been taking losartan although this was stopped recently with his hyperkalemia.  He has been followed by nephrology.  He is also seeing urology.  Prior to this he had some hypertension but even coming off of his ACE inhibitor he is now noting that his blood pressure is well controlled.  Since stopping that medication he has had no further dizziness.  He did not have any prior to this.  He never had any cardiac issues other than routine screening stress test years ago.  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  He denies any chest pressure, neck or arm discomfort.  He does not have any palpitations.  He has no new shortness of breath, PND or orthopnea.  He had lost about 20 pounds with his recent kidney and urinary problems.   Past Medical History:  Diagnosis Date   Arthritis    BPH with obstruction/lower urinary tract symptoms    CKD (chronic kidney disease)    DVT  (deep venous thrombosis) (HCC) 2016   LLE   GERD (gastroesophageal reflux disease)    Hyperlipidemia    Hypertension    Panic attacks     Past Surgical History:  Procedure Laterality Date   BALLOON DILATION  12/30/2020   Procedure: BALLOON DILATION;  Surgeon: Dolores Frame, MD;  [facility]: [facility];  Service: Gastroenterology;;   BIOPSY  12/30/2020   Procedure: BIOPSY;  Surgeon: Dolores Frame, MD;  [facility];  Service: Gastroenterology;;   COLONOSCOPY     COLONOSCOPY WITH PROPOFOL N/A 12/30/2020   Procedure: COLONOSCOPY WITH PROPOFOL;  Surgeon: Dolores Frame, MD;  [facility];  Service: Gastroenterology;  Laterality: N/A;   ESOPHAGOGASTRODUODENOSCOPY (EGD) WITH PROPOFOL N/A 12/30/2020   Procedure: ESOPHAGOGASTRODUODENOSCOPY (EGD) WITH PROPOFOL;  Surgeon: Dolores Frame, MD;  [facility]: [facility];  Service: Gastroenterology;  Laterality: N/A;  am     Current Outpatient Medications  Medication Sig Dispense Refill   acetaminophen (TYLENOL) 500 MG tablet Take 1,000 mg by mouth every 6 (six) hours as needed.     ALPRAZolam (XANAX) 0.25 MG tablet TAKE 1 TABLET (0.25 MG TOTAL) BY MOUTH AT BEDTIME. (Patient taking differently: Take 0.25 mg by mouth at bedtime as  needed for anxiety.) 30 tablet 2   apixaban (ELIQUIS) 5 MG TABS tablet TAKE 1 TABLET (5 MG TOTAL) BY MOUTH 2 (TWO) TIMES DAILY. 60 tablet 3   famotidine (PEPCID) 40 MG tablet TAKE 1 TABLET (40 MG TOTAL) BY MOUTH DAILY. 30 tablet 3   sodium bicarbonate 650 MG tablet Take 1 tablet (650 mg total) by mouth in the morning, in the evening, and at bedtime. 90 tablet 2   sodium polystyrene (KAYEXALATE) 15 GM/60ML suspension Take 60ml's by mouth 3 times each week (Patient taking differently: Take 35ml's by mouth 3 times each week) 720 mL 2   SPS 15 GM/60ML suspension Take by mouth 2 (two) times a week. Take 60 mls twice weekly     tamsulosin (FLOMAX) 0.4 MG CAPS capsule  Take 1 capsule (0.4 mg total) by mouth daily. 30 capsule 2   No current facility-administered medications for this visit.    Allergies:   Patient has no known allergies.    Social History:  The patient  reports that he has never smoked. He has never used smokeless tobacco. He reports previous drug use. He reports that he does not drink alcohol.   Family History:  The patient's family history includes Anxiety disorder in his mother; Cancer in his brother; Heart failure in his mother; Hypertension in his father and mother.    ROS:  Please see the history of present illness.   Otherwise, review of systems are positive for none.   All other systems are reviewed and negative.    PHYSICAL EXAM: VS:  BP 120/70   Pulse 78   Ht 5\' 11"  (1.803 m)   Wt 195 lb (88.5 kg)   BMI 27.20 kg/m  , BMI Body mass index is 27.2 kg/m. GENERAL:  Well appearing HEENT:  Pupils equal round and reactive, fundi not visualized, oral mucosa unremarkable NECK:  No jugular venous distention, waveform within normal limits, carotid upstroke brisk and symmetric, no bruits, no thyromegaly LYMPHATICS:  No cervical, inguinal adenopathy LUNGS:  Clear to auscultation bilaterally BACK:  No CVA tenderness CHEST:  Unremarkable HEART:  PMI not displaced or sustained,S1 and S2 within normal limits, no S3, no S4, no clicks, no rubs, no murmurs ABD:  Flat, positive bowel sounds normal in frequency in pitch, no bruits, no rebound, no guarding, no midline pulsatile mass, no hepatomegaly, no splenomegaly EXT:  2 plus pulses throughout, no edema, no cyanosis no clubbing SKIN:  No rashes no nodules NEURO:  Cranial nerves II through XII grossly intact, motor grossly intact throughout PSYCH:  Cognitively intact, oriented to person place and time    EKG:  EKG is ordered today. The ekg ordered today demonstrates sinus rhythm, rate 77, axis within normal limits, intervals within normal limits, no acute ST-T wave changes.   Recent  Labs: 12/08/2020: ALT 18 12/17/2020: TSH 1.53 01/26/2021: BUN 44; Creatinine, Ser 3.55; Hemoglobin 11.3; Platelets 250; Potassium 5.7; Sodium 134    Lipid Panel    Component Value Date/Time   CHOL 167 03/11/2019 1003   TRIG 88 03/11/2019 1003   HDL 44 03/11/2019 1003   CHOLHDL 3.8 03/11/2019 1003   LDLCALC 105 (H) 03/11/2019 1003      Wt Readings from Last 3 Encounters:  03/11/21 195 lb (88.5 kg)  01/26/21 187 lb (84.8 kg)  01/19/21 198 lb (89.8 kg)      Other studies Reviewed: Additional studies/ records that were reviewed today include: ED and nephrology records. Review of the above records demonstrates:  Please see elsewhere in the note.     ASSESSMENT AND PLAN:  TACHYCARDIA: This was probably related to his blood pressure and weight loss along with possibly some orthostatic issues.  This seems to also have resolved.  At this point I do not think further testing is indicated.  We had a long discussion about this.  He would let me know if it recurs.  HTN: His blood pressure seems to be controlled off of the ACE inhibitor.  I would suggest in the future may be Norvasc if he has hypertension but he seems to be keeping his pressure down with weight loss.  DVT: He is chronically on anticoagulation and I will defer to Anabel Halon, MD  DYSLIPIDEMIA: He said he did not tolerate statin in the past.  We talked at length about a plant-based diet.  He is near this.  CKD: This is being followed by nephrology.  On May 3 it was 3.55.  I will defer to their management.  Most recent ICU was 2.32.  This was on 517.  Current medicines are reviewed at length with the patient today.  The patient does not have concerns regarding medicines.  The following changes have been made:  no change  Labs/ tests ordered today include:   Orders Placed This Encounter  Procedures   EKG 12-Lead     Disposition:   FU with me as needed.      Signed, Rollene Rotunda, MD  03/11/2021 5:38 PM     [facility]

## 2021-03-11 ENCOUNTER — Other Ambulatory Visit: Payer: Self-pay

## 2021-03-11 ENCOUNTER — Encounter: Payer: Self-pay | Admitting: Cardiology

## 2021-03-11 ENCOUNTER — Ambulatory Visit (INDEPENDENT_AMBULATORY_CARE_PROVIDER_SITE_OTHER): Payer: 59 | Admitting: Cardiology

## 2021-03-11 VITALS — BP 120/70 | HR 78 | Ht 71.0 in | Wt 195.0 lb

## 2021-03-11 DIAGNOSIS — I10 Essential (primary) hypertension: Secondary | ICD-10-CM

## 2021-03-11 DIAGNOSIS — E785 Hyperlipidemia, unspecified: Secondary | ICD-10-CM | POA: Diagnosis not present

## 2021-03-11 DIAGNOSIS — R000 Tachycardia, unspecified: Secondary | ICD-10-CM

## 2021-03-11 NOTE — Addendum Note (Signed)
Addended by: Eustace Moore on: 03/11/2021 04:46 PM   Modules accepted: Orders

## 2021-03-11 NOTE — Patient Instructions (Addendum)
Medication Instructions:   Your physician recommends that you continue on your current medications as directed. Please refer to the Current Medication list given to you today.  Labwork:  none  Testing/Procedures:  none  Follow-Up:  Your physician recommends that you schedule a follow-up appointment in: as needed.  Any Other Special Instructions Will Be Listed Below (If Applicable).  If you need a refill on your cardiac medications before your next appointment, please call your pharmacy. 

## 2021-03-17 ENCOUNTER — Ambulatory Visit (HOSPITAL_COMMUNITY): Payer: 59 | Admitting: Hematology

## 2021-03-19 ENCOUNTER — Other Ambulatory Visit (HOSPITAL_COMMUNITY): Payer: Self-pay

## 2021-03-22 DIAGNOSIS — N1832 Chronic kidney disease, stage 3b: Secondary | ICD-10-CM | POA: Diagnosis not present

## 2021-03-22 DIAGNOSIS — D638 Anemia in other chronic diseases classified elsewhere: Secondary | ICD-10-CM | POA: Diagnosis not present

## 2021-03-22 DIAGNOSIS — E872 Acidosis: Secondary | ICD-10-CM | POA: Diagnosis not present

## 2021-03-22 DIAGNOSIS — R55 Syncope and collapse: Secondary | ICD-10-CM | POA: Diagnosis not present

## 2021-03-22 DIAGNOSIS — N170 Acute kidney failure with tubular necrosis: Secondary | ICD-10-CM | POA: Diagnosis not present

## 2021-03-23 DIAGNOSIS — E872 Acidosis: Secondary | ICD-10-CM | POA: Diagnosis not present

## 2021-03-23 DIAGNOSIS — N1832 Chronic kidney disease, stage 3b: Secondary | ICD-10-CM | POA: Diagnosis not present

## 2021-03-23 DIAGNOSIS — D638 Anemia in other chronic diseases classified elsewhere: Secondary | ICD-10-CM | POA: Diagnosis not present

## 2021-03-23 DIAGNOSIS — E211 Secondary hyperparathyroidism, not elsewhere classified: Secondary | ICD-10-CM | POA: Diagnosis not present

## 2021-03-23 DIAGNOSIS — R809 Proteinuria, unspecified: Secondary | ICD-10-CM | POA: Diagnosis not present

## 2021-03-26 ENCOUNTER — Other Ambulatory Visit (HOSPITAL_COMMUNITY): Payer: Self-pay

## 2021-04-01 ENCOUNTER — Other Ambulatory Visit (HOSPITAL_COMMUNITY): Payer: Self-pay

## 2021-04-05 ENCOUNTER — Ambulatory Visit (INDEPENDENT_AMBULATORY_CARE_PROVIDER_SITE_OTHER): Payer: 59 | Admitting: Gastroenterology

## 2021-04-08 ENCOUNTER — Inpatient Hospital Stay (HOSPITAL_COMMUNITY): Payer: 59 | Attending: Hematology

## 2021-04-08 ENCOUNTER — Other Ambulatory Visit: Payer: Self-pay

## 2021-04-08 DIAGNOSIS — N400 Enlarged prostate without lower urinary tract symptoms: Secondary | ICD-10-CM | POA: Diagnosis not present

## 2021-04-08 DIAGNOSIS — K219 Gastro-esophageal reflux disease without esophagitis: Secondary | ICD-10-CM | POA: Insufficient documentation

## 2021-04-08 DIAGNOSIS — M546 Pain in thoracic spine: Secondary | ICD-10-CM | POA: Insufficient documentation

## 2021-04-08 DIAGNOSIS — R109 Unspecified abdominal pain: Secondary | ICD-10-CM | POA: Diagnosis not present

## 2021-04-08 DIAGNOSIS — N1330 Unspecified hydronephrosis: Secondary | ICD-10-CM | POA: Insufficient documentation

## 2021-04-08 DIAGNOSIS — Z7901 Long term (current) use of anticoagulants: Secondary | ICD-10-CM | POA: Diagnosis not present

## 2021-04-08 DIAGNOSIS — Z86718 Personal history of other venous thrombosis and embolism: Secondary | ICD-10-CM | POA: Insufficient documentation

## 2021-04-08 DIAGNOSIS — I129 Hypertensive chronic kidney disease with stage 1 through stage 4 chronic kidney disease, or unspecified chronic kidney disease: Secondary | ICD-10-CM | POA: Insufficient documentation

## 2021-04-08 DIAGNOSIS — I82409 Acute embolism and thrombosis of unspecified deep veins of unspecified lower extremity: Secondary | ICD-10-CM

## 2021-04-08 DIAGNOSIS — E785 Hyperlipidemia, unspecified: Secondary | ICD-10-CM | POA: Diagnosis not present

## 2021-04-08 DIAGNOSIS — N1832 Chronic kidney disease, stage 3b: Secondary | ICD-10-CM | POA: Diagnosis not present

## 2021-04-08 LAB — COMPREHENSIVE METABOLIC PANEL
ALT: 16 U/L (ref 0–44)
AST: 19 U/L (ref 15–41)
Albumin: 4.1 g/dL (ref 3.5–5.0)
Alkaline Phosphatase: 86 U/L (ref 38–126)
Anion gap: 4 — ABNORMAL LOW (ref 5–15)
BUN: 18 mg/dL (ref 8–23)
CO2: 29 mmol/L (ref 22–32)
Calcium: 8.9 mg/dL (ref 8.9–10.3)
Chloride: 104 mmol/L (ref 98–111)
Creatinine, Ser: 1.97 mg/dL — ABNORMAL HIGH (ref 0.61–1.24)
GFR, Estimated: 37 mL/min — ABNORMAL LOW (ref >60)
Glucose, Bld: 107 mg/dL — ABNORMAL HIGH (ref 70–99)
Potassium: 4.7 mmol/L (ref 3.5–5.1)
Sodium: 137 mmol/L (ref 135–145)
Total Bilirubin: 0.6 mg/dL (ref 0.3–1.2)
Total Protein: 7.4 g/dL (ref 6.5–8.1)

## 2021-04-08 LAB — CBC WITH DIFFERENTIAL/PLATELET
Abs Immature Granulocytes: 0.03 10*3/uL (ref 0.00–0.07)
Basophils Absolute: 0 10*3/uL (ref 0.0–0.1)
Basophils Relative: 1 %
Eosinophils Absolute: 0.1 10*3/uL (ref 0.0–0.5)
Eosinophils Relative: 2 %
HCT: 36.9 % — ABNORMAL LOW (ref 39.0–52.0)
Hemoglobin: 12 g/dL — ABNORMAL LOW (ref 13.0–17.0)
Immature Granulocytes: 1 %
Lymphocytes Relative: 39 %
Lymphs Abs: 1.7 10*3/uL (ref 0.7–4.0)
MCH: 32.3 pg (ref 26.0–34.0)
MCHC: 32.5 g/dL (ref 30.0–36.0)
MCV: 99.5 fL (ref 80.0–100.0)
Monocytes Absolute: 0.4 10*3/uL (ref 0.1–1.0)
Monocytes Relative: 10 %
Neutro Abs: 2.2 10*3/uL (ref 1.7–7.7)
Neutrophils Relative %: 47 %
Platelets: 220 10*3/uL (ref 150–400)
RBC: 3.71 MIL/uL — ABNORMAL LOW (ref 4.22–5.81)
RDW: 12.7 % (ref 11.5–15.5)
WBC: 4.5 10*3/uL (ref 4.0–10.5)
nRBC: 0 % (ref 0.0–0.2)

## 2021-04-08 LAB — D-DIMER, QUANTITATIVE: D-Dimer, Quant: <0.27 ug/mL-FEU (ref 0.00–0.50)

## 2021-04-12 NOTE — Progress Notes (Signed)
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  He had abdominal ultrasound done recently for recurrent nausea with some vomiting.  Ultrasound revealed significant bilateral hydroureteronephrosis and bladder.  His creatinine which in December 2021 was one-point was 4.74.  March 2015.  His potassium was 4.9.  His hemoglobin which in late 2021 was 12.6 was most recently 10.5.   The patient has been treated for BPH for some time, first by Dr. Mirna Mires in [place].  He has been on tamsulosin.  Urinary symptoms have been significant for the past year or 2   IPSS 17, quality-of-life score 3. Foley placed, > 2L urine returned.   5.3.2022: He is here today for follow-up.  He does have an occasional urge to go with the catheter in place.  His hematuria which he experienced directly after catheterization has cleared.  His PSA checked at his last visit was normal, under 3.  His creatinine did come down some when he saw the nephrologist, Dr. Wolfgang Phoenix.   He has experienced lightheadedness.  He is off his losartan.  2 to 3 days ago he did have anterior chest pain with some tightness.  He did not seek medical attention for this.   5.17.2022: He was seen in the emergency room for his lightheadedness, sent home after evaluation.  He has not been on his tamsulosin.  7.19.2022: Failed his voiding trial about a month ago.  He does have an indwelling catheter that is plugged.  He does get an urge to go when his bladder seems full.  He is still on tamsulosin.  His recent PSA was normal at 2.6.  Past Medical History:  Diagnosis Date   Arthritis    BPH with obstruction/lower urinary tract symptoms    CKD (chronic kidney disease)    DVT (deep venous thrombosis) (HCC) 2016   LLE   GERD (gastroesophageal reflux disease)    Hyperlipidemia    Hypertension    Panic attacks     Past Surgical History:  Procedure Laterality  Date   BALLOON DILATION  12/30/2020   Procedure: BALLOON DILATION;  Surgeon: Dolores Frame, MD;  [facility];  Service: Gastroenterology;;   BIOPSY  12/30/2020   Procedure: BIOPSY;  Surgeon: Dolores Frame, MD;  [facility];  Service: Gastroenterology;;   COLONOSCOPY     COLONOSCOPY WITH PROPOFOL N/A 12/30/2020   Procedure: COLONOSCOPY WITH PROPOFOL;  Surgeon: Dolores Frame, MD;  [facility];  Service: Gastroenterology;  Laterality: N/A;   ESOPHAGOGASTRODUODENOSCOPY (EGD) WITH PROPOFOL N/A 12/30/2020   Procedure: ESOPHAGOGASTRODUODENOSCOPY (EGD) WITH PROPOFOL;  Surgeon: Dolores Frame, MD;  [facility];  Service: Gastroenterology;  Laterality: N/A;  am    Home Medications:  Allergies as of 04/13/2021   No Known Allergies      Medication List        Accurate as of April 12, 2021 10:00 AM. If you have any questions, ask your nurse or doctor.          acetaminophen 500 MG tablet Commonly known as: TYLENOL Take 1,000 mg by mouth every 6 (six) hours as needed.   ALPRAZolam 0.25 MG tablet Commonly known as: XANAX TAKE 1 TABLET (0.25 MG TOTAL) BY MOUTH AT BEDTIME. What changed:  how much to take how to take this when to take this reasons to take this   Eliquis 5 MG Tabs tablet Generic drug:  apixaban TAKE 1 TABLET (5 MG TOTAL) BY MOUTH 2 (TWO) TIMES DAILY.   famotidine 40 MG tablet Commonly known as: PEPCID TAKE 1 TABLET (40 MG TOTAL) BY MOUTH DAILY.   sodium bicarbonate 650 MG tablet Take 1 tablet (650 mg total) by mouth in the morning, in the evening, and at bedtime.   SPS 15 GM/60ML suspension Generic drug: sodium polystyrene Take by mouth 2 (two) times a week. Take 60 mls twice weekly   sodium polystyrene 15 GM/60ML suspension Commonly known as: KAYEXALATE Take 13ml's by mouth 3 times each week   tamsulosin 0.4 MG Caps capsule Commonly known as: FLOMAX Take 1 capsule (0.4 mg  total) by mouth daily.        Allergies: No Known Allergies  Family History  Problem Relation Age of Onset   Heart failure Mother    Hypertension Mother    Anxiety disorder Mother    Hypertension Father    Cancer Brother     Social History:  reports that he has never smoked. He has never used smokeless tobacco. He reports previous drug use. He reports that he does not drink alcohol.  ROS: A complete review of systems was performed.  All systems are negative except for pertinent findings as noted.  Physical Exam:  Vital signs in last 24 hours: There were no vitals taken for this visit. Constitutional:  Alert and oriented, No acute distress Cardiovascular: Regular rate  Respiratory: Normal respiratory effort  Neurologic: Grossly intact, no focal deficits Psychiatric: Normal mood and affect  I have reviewed prior pt notes  I have reviewed notes from referring/previous physicians  I have reviewed urinalysis results  I have independently reviewed prior imaging  I have reviewed prior PSA results  Cystoscopy Procedure Note:  Indication: Urinary retention  After informed consent and discussion of the procedure and its risks, Cinsere Mizrahi Vaeth was positioned and prepped in the standard fashion.  Cystoscopy was performed with a flexible cystoscope.   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    Impression/Assessment:  1.  BPH with retention.  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  He does not have what seems to be an elongated prostatic urethra  2.  Catheter dependence.  3.  History of hydronephrosis, resolved with catheter placement  Plan:  1.  Catheter was placed again today with a plug.  He will drain as  needed  2.  I will set up an appointment for prostate ultrasound for volume measurement.  I will call with results.  If significantly large gland, I do not think he will be a candidate for UroLift, which is a procedure that he prefers.

## 2021-04-13 ENCOUNTER — Ambulatory Visit: Payer: 59 | Admitting: Urology

## 2021-04-13 ENCOUNTER — Encounter: Payer: Self-pay | Admitting: Urology

## 2021-04-13 ENCOUNTER — Other Ambulatory Visit: Payer: Self-pay

## 2021-04-13 VITALS — BP 148/89 | HR 92

## 2021-04-13 DIAGNOSIS — N401 Enlarged prostate with lower urinary tract symptoms: Secondary | ICD-10-CM

## 2021-04-13 DIAGNOSIS — N138 Other obstructive and reflux uropathy: Secondary | ICD-10-CM | POA: Diagnosis not present

## 2021-04-13 DIAGNOSIS — R339 Retention of urine, unspecified: Secondary | ICD-10-CM | POA: Diagnosis not present

## 2021-04-13 MED ORDER — CIPROFLOXACIN HCL 500 MG PO TABS
500.0000 mg | ORAL_TABLET | Freq: Once | ORAL | Status: AC
  Administered 2021-04-13: 500 mg via ORAL

## 2021-04-13 NOTE — Progress Notes (Signed)
Cath Change/ Replacement  Patient is present today for a catheter change due to urinary retention.  5ml of water was removed from the balloon, a 16FR foley cath was removed with out difficulty.  Patient was cleaned and prepped in a sterile fashion with betadine. A 16 FR foley cath was replaced into the bladder no complications were noted Urine return was noted and urine was yellow in color. The balloon was filled with 23ml of sterile water. Catheter was capped.  Patient was given proper instruction on catheter care.    Performed by: Marchelle Folks RN       Urological Symptom Review  Patient is experiencing the following symptoms: Stream starts and stops Trouble starting stream Have to strain to urinate Weak stream   Review of Systems  Gastrointestinal (upper)  : Negative for upper GI symptoms  Gastrointestinal (lower) : Negative for lower GI symptoms  Constitutional : Negative for symptoms  Skin: Negative for skin symptoms  Eyes: Negative for eye symptoms  Ear/Nose/Throat : Negative for Ear/Nose/Throat symptoms  Hematologic/Lymphatic: Negative for Hematologic/Lymphatic symptoms  Cardiovascular : Leg swelling  Respiratory : Negative for respiratory symptoms  Endocrine: Negative for endocrine symptoms  Musculoskeletal: Back pain  Neurological: Negative for neurological symptoms  Psychologic: Negative for psychiatric symptoms

## 2021-04-15 ENCOUNTER — Ambulatory Visit (HOSPITAL_COMMUNITY): Payer: 59 | Admitting: Hematology

## 2021-04-15 ENCOUNTER — Inpatient Hospital Stay (HOSPITAL_COMMUNITY): Payer: 59 | Admitting: Oncology

## 2021-04-15 ENCOUNTER — Other Ambulatory Visit: Payer: Self-pay

## 2021-04-15 ENCOUNTER — Ambulatory Visit (HOSPITAL_COMMUNITY)
Admission: RE | Admit: 2021-04-15 | Discharge: 2021-04-15 | Disposition: A | Discharge status: Home / Self Care | Payer: 59 | Source: Ambulatory Visit | Attending: Oncology | Admitting: Oncology

## 2021-04-15 ENCOUNTER — Other Ambulatory Visit (HOSPITAL_COMMUNITY): Payer: Self-pay | Admitting: Oncology

## 2021-04-15 VITALS — BP 133/72 | HR 72 | Temp 97.7°F | Resp 18 | Wt 200.2 lb

## 2021-04-15 DIAGNOSIS — M546 Pain in thoracic spine: Secondary | ICD-10-CM

## 2021-04-15 DIAGNOSIS — I82409 Acute embolism and thrombosis of unspecified deep veins of unspecified lower extremity: Secondary | ICD-10-CM

## 2021-04-15 NOTE — Progress Notes (Signed)
Bruce Adkins   [phone] [phone]   Clinic Follow up Note   Patient Care Team: Lindell Spar, MD as PCP - General (Internal Medicine) Minus Breeding, MD as PCP - Cardiology (Cardiology) Derek Jack, MD as Consulting Physician (Hematology)  Date of Service:  04/15/2021  CHIEF COMPLAINT: F/u of recurrent DVT  CURRENT THERAPY:  Oral Xarelto daily, indefinitely.   INTERVAL HISTORY:  Bruce Adkins is here for a follow up of recurrent DVT.  He was last seen in clinic on 09/08/2020 by Dr. Burr Medico.   Initially developed unprovoked DVT back in October 2016 and was on 1 year of anticoagulation with Xarelto.  Developed second blood clot in his left upper extremity on 02/22/2019 for which she was started back on Xarelto but then switched to Eliquis due to CKD stage IIIb.  In the interim, he establish care with a new PCP Dr. Posey Pronto.  He was treated with Pepcid for abdominal pain thought to be secondary to GERD and referred to GI.    He met with GI on 12/17/2020 and he was scheduled for an EGD and colonoscopy.  Colonoscopy and EGD on 12/30/2020 showed hemorrhoids that were nonbleeding but otherwise unremarkable.  Found to have a nonobstructing schatzki ring at the GE junction.    Had abdominal ultrasound on 01/07/2021 for work-up for abdominal pain which showed bilateral hydronephrosis.  He was referred to urology who thought it was likely secondary to BPH with retention.  He had a Foley catheter placed by Dr. Diona Fanti on 01/19/2021.  2 L of urine was removed.  Repeat ultrasound on 01/25/2021 showed resolution.  He remains Foley catheter dependent secondary to 2 failed voiding trials.  He recently had it replaced on 04/13/2021.  He is scheduled for prostate ultrasound on 04/19/2021.  He met with Dr. Theador Hawthorne nephrology for CKD.  Creatinine was 5.67 on 01/19/2021 and has slowly resolved since he had his Foley catheter placed.  Most recent creatinine is 1.97.  He  was seen in the ED on 01/26/2021 for tachycardia and chest pain.  Potassium was 5.7 and he was given Kayexalate.  Troponins were negative.    Overall he feels he is improving.  Has noted acute thoracic back pain for the past 2 months.  Pain is worse while lying down.  Rates the pain 8 out of 10 especially throughout the night.  He has been taking Tylenol without significant relief.  He denies any injury but did admit to 2 near syncopal episodes related to his blood pressure.  States he caught himself and was able to maneuver himself to the ground prior to a fall.  He has been compliant with his Eliquis and denies any missed doses.  Denies any swelling or pain in his extremities.    Review of Systems  Constitutional: Negative.  Negative for chills, fever, malaise/fatigue and weight loss.  HENT:  Negative for congestion, ear pain and tinnitus.   Eyes: Negative.  Negative for blurred vision and double vision.  Respiratory: Negative.  Negative for cough, sputum production and shortness of breath.   Cardiovascular: Negative.  Negative for chest pain, palpitations and leg swelling.  Gastrointestinal: Negative.  Negative for abdominal pain, constipation, diarrhea, nausea and vomiting.  Genitourinary:  Negative for dysuria, frequency and urgency.  Musculoskeletal:  Positive for back pain. Negative for falls.  Skin: Negative.  Negative for rash.  Neurological: Negative.  Negative for weakness and headaches.  Endo/Heme/Allergies: Negative.  Does not bruise/bleed easily.  Psychiatric/Behavioral:  Negative.  Negative for depression. The patient is not nervous/anxious and does not have insomnia.    MEDICAL HISTORY:  Past Medical History:  Diagnosis Date   Arthritis    BPH with obstruction/lower urinary tract symptoms    CKD (chronic kidney disease)    DVT (deep venous thrombosis) ([place]) 2016   LLE   GERD (gastroesophageal reflux disease)    Hyperlipidemia    Hypertension    Panic attacks     SURGICAL  HISTORY: Past Surgical History:  Procedure Laterality Date   BALLOON DILATION  12/30/2020   Procedure: BALLOON DILATION;  Surgeon: Harvel Quale, MD;  [facility];  Service: Gastroenterology;;   BIOPSY  12/30/2020   Procedure: BIOPSY;  Surgeon: Harvel Quale, MD;  [facility];  Service: Gastroenterology;;   COLONOSCOPY     COLONOSCOPY WITH PROPOFOL N/A 12/30/2020   Procedure: COLONOSCOPY WITH PROPOFOL;  Surgeon: Harvel Quale, MD;  [facility];  Service: Gastroenterology;  Laterality: N/A;   ESOPHAGOGASTRODUODENOSCOPY (EGD) WITH PROPOFOL N/A 12/30/2020   Procedure: ESOPHAGOGASTRODUODENOSCOPY (EGD) WITH PROPOFOL;  Surgeon: Harvel Quale, MD;  [facility];  Service: Gastroenterology;  Laterality: N/A;  am    I have reviewed the social history and family history with the patient and they are unchanged from previous note.  ALLERGIES:  has No Known Allergies.  MEDICATIONS:  Current Outpatient Medications  Medication Sig Dispense Refill   acetaminophen (TYLENOL) 500 MG tablet Take 1,000 mg by mouth every 6 (six) hours as needed.     ALPRAZolam (XANAX) 0.25 MG tablet TAKE 1 TABLET (0.25 MG TOTAL) BY MOUTH AT BEDTIME. (Patient taking differently: Take 0.25 mg by mouth at bedtime as needed for anxiety.) 30 tablet 2   apixaban (ELIQUIS) 5 MG TABS tablet TAKE 1 TABLET (5 MG TOTAL) BY MOUTH 2 (TWO) TIMES DAILY. 60 tablet 3   famotidine (PEPCID) 40 MG tablet TAKE 1 TABLET (40 MG TOTAL) BY MOUTH DAILY. 30 tablet 3   sodium bicarbonate 650 MG tablet Take 1 tablet (650 mg total) by mouth in the morning, in the evening, and at bedtime. 90 tablet 2   tamsulosin (FLOMAX) 0.4 MG CAPS capsule Take 1 capsule (0.4 mg total) by mouth daily. 30 capsule 2   No current facility-administered medications for this visit.    PHYSICAL EXAMINATION: ECOG PERFORMANCE STATUS: 1 - Symptomatic but completely ambulatory  Vitals:    04/15/21 0920  BP: 133/72  Pulse: 72  Resp: 18  Temp: 97.7 F (36.5 C)  SpO2: 100%    Filed Weights   04/15/21 0920  Weight: 200 lb 3.2 oz (90.8 kg)     Physical Exam Constitutional:      Appearance: Normal appearance.  HENT:     Head: Normocephalic and atraumatic.  Eyes:     Pupils: Pupils are equal, round, and reactive to light.  Cardiovascular:     Rate and Rhythm: Normal rate and regular rhythm.     Heart sounds: Normal heart sounds. No murmur heard. Pulmonary:     Effort: Pulmonary effort is normal.     Breath sounds: Normal breath sounds. No wheezing.  Abdominal:     General: Bowel sounds are normal. There is no distension.     Palpations: Abdomen is soft.     Tenderness: There is no abdominal tenderness.  Musculoskeletal:        General: Normal range of motion.     Cervical back: Normal range of motion.  Skin:    General: Skin is warm and dry.     Findings: No rash.  Neurological:     Mental Status: He is alert and oriented to person, place, and time.  Psychiatric:        Judgment: Judgment normal.     LABORATORY DATA:  I have reviewed the data as listed CBC Latest Ref Rng & Units 04/08/2021 01/26/2021 12/08/2020  WBC 4.0 - 10.5 K/uL 4.5 6.5 4.6  Hemoglobin 13.0 - 17.0 g/dL 12.0(L) 11.3(L) 10.5(L)  Hematocrit 39.0 - 52.0 % 36.9(L) 34.0(L) 32.3(L)  Platelets 150 - 400 K/uL 220 250 181     CMP Latest Ref Rng & Units 04/08/2021 01/26/2021 01/22/2021  Glucose 70 - 99 mg/dL 107(H) 108(H) 113(H)  BUN 8 - 23 mg/dL 18 44(H) 41(H)  Creatinine 0.61 - 1.24 mg/dL 1.97(H) 3.55(H) 4.41(H)  Sodium 135 - 145 mmol/L 137 134(L) 137  Potassium 3.5 - 5.1 mmol/L 4.7 5.7(H) 5.6(H)  Chloride 98 - 111 mmol/L 104 105 106  CO2 22 - 32 mmol/L 29 21(L) 23  Calcium 8.9 - 10.3 mg/dL 8.9 9.0 9.5  Total Protein 6.5 - 8.1 g/dL 7.4 - -  Total Bilirubin 0.3 - 1.2 mg/dL 0.6 - -  Alkaline Phos 38 - 126 U/L 86 - -  AST 15 - 41 U/L 19 - -  ALT 0 - 44 U/L 16 - -      RADIOGRAPHIC  STUDIES: I have personally reviewed the radiological images as listed and agreed with the findings in the report. DG Thoracic Spine W/Swimmers  Result Date: 04/15/2021 CLINICAL DATA:  Mid back pain.  No known injury. EXAM: THORACIC SPINE - 3 VIEWS COMPARISON:  None. FINDINGS: There is no evidence of thoracic spine fracture. Alignment is normal. No other significant bone abnormalities are identified. IMPRESSION: Negative. Electronically Signed   By: Misty Stanley M.D.   On: 04/15/2021 11:12     ASSESSMENT & PLAN:  BODHI MORADI is a 65 y.o. male with   1.    History recurrent DVT, on long-term anticoagulation 07/03/2015 had unprovoked left leg DVT and was treated with 1 year of Xarelto. 02/22/2019 developed swelling in left upper extremity found to have a axillary and brachial DVT.  He was started back on Xarelto. Recommendation is for indefinite anticoagulation. We will continue to assess risk versus benefit ratio once a year.   Recommend compression stockings on a daily basis. He is currently on Eliquis 5 mg every 12 hours-recently switched from Xarelto secondary to CKD. Appears to be tolerating anticoagulation well.  Denies any bleeding.  2.  Hypertension and CKD stage III Taken off BP medication secondary to syncopal episodes. BP stable.  3.  Bilateral hydronephrosis Followed by urology Dr. Diona Fanti Secondary to BPH and has resolved with Foley placement Remains catheter dependent d/t several failed voiding trials-recently exchanged on 04/13/2021 Had a cystoscopy showing bilobar hyperplasia with obstructing median lobes. Plan is for an ultrasound of his prostate (04/19/21) and possible UroLift  4.  Abdominal pain: Had EGD and colonoscopy (12/30/2020) which was essentially unremarkable. This seems to have resolved. He continues Pepcid for GERD.  5.  Thoracic back pain: Unclear etiology. Symptoms have been present for approximately 2 months and he rates it an 8 out of 10 in  pain. Currently using Tylenol without significant relief He denies any injury. Will get plain films of his back to rule out fracture-anticipate possible MRI in the future. Will call with results.  PLAN:  Eliquis as  prescribed. Keep follow-up with urology X-ray of thoracic spine-we will call with results. RTC and 3 months for labs only in 6 months for labs and see Dr. Delton Coombes.  I spent 35 minutes dedicated to the care of this patient (face-to-face and non-face-to-face) on the date of the encounter to include what is described in the assessment and plan.   No problem-specific Assessment & Plan notes found for this encounter.   No orders of the defined types were placed in this encounter.  All questions were answered. The patient knows to call the clinic with any problems, questions or concerns. No barriers to learning was detected. The total time spent in the appointment was 25 minutes.   Jacquelin Hawking, NP 04/15/2021

## 2021-04-17 ENCOUNTER — Encounter: Payer: Self-pay | Admitting: Oncology

## 2021-04-18 ENCOUNTER — Other Ambulatory Visit: Payer: Self-pay | Admitting: Oncology

## 2021-04-18 MED ORDER — BACLOFEN 10 MG PO TABS
10.0000 mg | ORAL_TABLET | Freq: Every evening | ORAL | 0 refills | Status: DC | PRN
  Filled 2021-04-18: qty 30, 30d supply, fill #0

## 2021-04-19 ENCOUNTER — Other Ambulatory Visit (HOSPITAL_COMMUNITY): Payer: Self-pay

## 2021-04-19 ENCOUNTER — Ambulatory Visit (HOSPITAL_COMMUNITY)
Admission: RE | Admit: 2021-04-19 | Discharge: 2021-04-19 | Disposition: A | Discharge status: Home / Self Care | Payer: 59 | Source: Ambulatory Visit | Attending: Urology | Admitting: Urology

## 2021-04-19 ENCOUNTER — Other Ambulatory Visit: Payer: Self-pay

## 2021-04-19 DIAGNOSIS — N401 Enlarged prostate with lower urinary tract symptoms: Secondary | ICD-10-CM | POA: Insufficient documentation

## 2021-04-19 DIAGNOSIS — N138 Other obstructive and reflux uropathy: Secondary | ICD-10-CM | POA: Diagnosis not present

## 2021-04-19 DIAGNOSIS — M546 Pain in thoracic spine: Secondary | ICD-10-CM

## 2021-04-19 DIAGNOSIS — I82409 Acute embolism and thrombosis of unspecified deep veins of unspecified lower extremity: Secondary | ICD-10-CM

## 2021-04-19 MED ORDER — LIDOCAINE HCL (PF) 2 % IJ SOLN
INTRAMUSCULAR | Status: AC
  Filled 2021-04-19: qty 5

## 2021-04-19 MED ORDER — GENTAMICIN SULFATE 40 MG/ML IJ SOLN
INTRAMUSCULAR | Status: AC
  Filled 2021-04-19: qty 4

## 2021-04-26 ENCOUNTER — Encounter (HOSPITAL_COMMUNITY): Payer: Self-pay

## 2021-04-26 ENCOUNTER — Telehealth (HOSPITAL_COMMUNITY): Payer: 59 | Admitting: Physician Assistant

## 2021-04-26 ENCOUNTER — Telehealth: Payer: Self-pay

## 2021-04-26 NOTE — Telephone Encounter (Signed)
Patient had his ultra sound done today at [facility]. Wanting to know what the next step will be.  Please advise.   Call back  [phone] Judie Petit)   Thanks,  Rosey Bath

## 2021-04-26 NOTE — Telephone Encounter (Signed)
Patient is calling for his prostate u/s results.

## 2021-04-27 NOTE — Telephone Encounter (Signed)
Patient would like to proceed with urolift.

## 2021-05-11 NOTE — Telephone Encounter (Signed)
Patient wife called for patient today and notified of sheet received and faxed to [facility]. Voiced understanding.

## 2021-05-17 ENCOUNTER — Other Ambulatory Visit (HOSPITAL_COMMUNITY): Payer: Self-pay

## 2021-05-17 ENCOUNTER — Other Ambulatory Visit: Payer: Self-pay | Admitting: Oncology

## 2021-05-19 ENCOUNTER — Other Ambulatory Visit (HOSPITAL_COMMUNITY): Payer: Self-pay

## 2021-05-19 ENCOUNTER — Other Ambulatory Visit: Payer: Self-pay | Admitting: Oncology

## 2021-05-20 ENCOUNTER — Other Ambulatory Visit (HOSPITAL_COMMUNITY): Payer: Self-pay

## 2021-05-21 ENCOUNTER — Other Ambulatory Visit (HOSPITAL_COMMUNITY): Payer: Self-pay

## 2021-05-21 ENCOUNTER — Other Ambulatory Visit: Payer: Self-pay | Admitting: Oncology

## 2021-05-21 DIAGNOSIS — E872 Acidosis: Secondary | ICD-10-CM | POA: Diagnosis not present

## 2021-05-21 DIAGNOSIS — D638 Anemia in other chronic diseases classified elsewhere: Secondary | ICD-10-CM | POA: Diagnosis not present

## 2021-05-21 DIAGNOSIS — N1832 Chronic kidney disease, stage 3b: Secondary | ICD-10-CM | POA: Diagnosis not present

## 2021-05-21 DIAGNOSIS — R809 Proteinuria, unspecified: Secondary | ICD-10-CM | POA: Diagnosis not present

## 2021-05-21 DIAGNOSIS — E211 Secondary hyperparathyroidism, not elsewhere classified: Secondary | ICD-10-CM | POA: Diagnosis not present

## 2021-05-24 ENCOUNTER — Other Ambulatory Visit (HOSPITAL_COMMUNITY): Payer: Self-pay

## 2021-05-24 MED ORDER — BACLOFEN 10 MG PO TABS
10.0000 mg | ORAL_TABLET | Freq: Every evening | ORAL | 0 refills | Status: DC | PRN
  Filled 2021-05-24: qty 30, 30d supply, fill #0

## 2021-05-26 ENCOUNTER — Other Ambulatory Visit (HOSPITAL_COMMUNITY): Payer: Self-pay

## 2021-05-26 DIAGNOSIS — E211 Secondary hyperparathyroidism, not elsewhere classified: Secondary | ICD-10-CM | POA: Diagnosis not present

## 2021-05-26 DIAGNOSIS — N1832 Chronic kidney disease, stage 3b: Secondary | ICD-10-CM | POA: Diagnosis not present

## 2021-05-26 DIAGNOSIS — R809 Proteinuria, unspecified: Secondary | ICD-10-CM | POA: Diagnosis not present

## 2021-05-26 MED ORDER — LISINOPRIL 2.5 MG PO TABS
2.5000 mg | ORAL_TABLET | Freq: Every day | ORAL | 3 refills | Status: DC
  Filled 2021-05-26: qty 90, 90d supply, fill #0
  Filled 2021-08-25: qty 90, 90d supply, fill #1
  Filled 2021-11-24: qty 90, 90d supply, fill #2

## 2021-06-09 DIAGNOSIS — R809 Proteinuria, unspecified: Secondary | ICD-10-CM | POA: Diagnosis not present

## 2021-06-09 DIAGNOSIS — E211 Secondary hyperparathyroidism, not elsewhere classified: Secondary | ICD-10-CM | POA: Diagnosis not present

## 2021-06-09 DIAGNOSIS — N1832 Chronic kidney disease, stage 3b: Secondary | ICD-10-CM | POA: Diagnosis not present

## 2021-06-13 ENCOUNTER — Emergency Department (HOSPITAL_COMMUNITY)
Admission: EM | Admit: 2021-06-13 | Discharge: 2021-06-13 | Disposition: A | Discharge status: Home / Self Care | Payer: 59 | Attending: Emergency Medicine | Admitting: Emergency Medicine

## 2021-06-13 ENCOUNTER — Emergency Department (HOSPITAL_COMMUNITY): Payer: 59

## 2021-06-13 ENCOUNTER — Encounter (HOSPITAL_COMMUNITY): Payer: Self-pay | Admitting: *Deleted

## 2021-06-13 ENCOUNTER — Other Ambulatory Visit: Payer: Self-pay

## 2021-06-13 DIAGNOSIS — R5383 Other fatigue: Secondary | ICD-10-CM | POA: Diagnosis not present

## 2021-06-13 DIAGNOSIS — R6883 Chills (without fever): Secondary | ICD-10-CM | POA: Diagnosis not present

## 2021-06-13 DIAGNOSIS — R69 Illness, unspecified: Secondary | ICD-10-CM | POA: Diagnosis not present

## 2021-06-13 DIAGNOSIS — I129 Hypertensive chronic kidney disease with stage 1 through stage 4 chronic kidney disease, or unspecified chronic kidney disease: Secondary | ICD-10-CM | POA: Diagnosis not present

## 2021-06-13 DIAGNOSIS — R112 Nausea with vomiting, unspecified: Secondary | ICD-10-CM | POA: Insufficient documentation

## 2021-06-13 DIAGNOSIS — R509 Fever, unspecified: Secondary | ICD-10-CM | POA: Diagnosis not present

## 2021-06-13 DIAGNOSIS — R109 Unspecified abdominal pain: Secondary | ICD-10-CM | POA: Diagnosis not present

## 2021-06-13 DIAGNOSIS — K219 Gastro-esophageal reflux disease without esophagitis: Secondary | ICD-10-CM | POA: Diagnosis not present

## 2021-06-13 DIAGNOSIS — Z79899 Other long term (current) drug therapy: Secondary | ICD-10-CM | POA: Diagnosis not present

## 2021-06-13 DIAGNOSIS — N1330 Unspecified hydronephrosis: Secondary | ICD-10-CM | POA: Diagnosis not present

## 2021-06-13 DIAGNOSIS — N281 Cyst of kidney, acquired: Secondary | ICD-10-CM | POA: Diagnosis not present

## 2021-06-13 DIAGNOSIS — N390 Urinary tract infection, site not specified: Secondary | ICD-10-CM

## 2021-06-13 DIAGNOSIS — N189 Chronic kidney disease, unspecified: Secondary | ICD-10-CM | POA: Insufficient documentation

## 2021-06-13 DIAGNOSIS — R319 Hematuria, unspecified: Secondary | ICD-10-CM

## 2021-06-13 LAB — URINALYSIS, ROUTINE W REFLEX MICROSCOPIC
Bilirubin Urine: NEGATIVE
Glucose, UA: NEGATIVE mg/dL
Ketones, ur: NEGATIVE mg/dL
Nitrite: POSITIVE — AB
Protein, ur: 30 mg/dL — AB
Specific Gravity, Urine: 1.016 (ref 1.005–1.030)
WBC, UA: >50 WBC/hpf — ABNORMAL HIGH (ref 0–5)
pH: 5 (ref 5.0–8.0)

## 2021-06-13 LAB — BASIC METABOLIC PANEL
Anion gap: 8 (ref 5–15)
BUN: 28 mg/dL — ABNORMAL HIGH (ref 8–23)
CO2: 23 mmol/L (ref 22–32)
Calcium: 8.3 mg/dL — ABNORMAL LOW (ref 8.9–10.3)
Chloride: 100 mmol/L (ref 98–111)
Creatinine, Ser: 2.25 mg/dL — ABNORMAL HIGH (ref 0.61–1.24)
GFR, Estimated: 32 mL/min — ABNORMAL LOW (ref >60)
Glucose, Bld: 105 mg/dL — ABNORMAL HIGH (ref 70–99)
Potassium: 4.5 mmol/L (ref 3.5–5.1)
Sodium: 131 mmol/L — ABNORMAL LOW (ref 135–145)

## 2021-06-13 LAB — CBC WITH DIFFERENTIAL/PLATELET
Abs Immature Granulocytes: 0.05 10*3/uL (ref 0.00–0.07)
Basophils Absolute: 0.1 10*3/uL (ref 0.0–0.1)
Basophils Relative: 0 %
Eosinophils Absolute: 0.1 10*3/uL (ref 0.0–0.5)
Eosinophils Relative: 0 %
HCT: 39.3 % (ref 39.0–52.0)
Hemoglobin: 12.9 g/dL — ABNORMAL LOW (ref 13.0–17.0)
Immature Granulocytes: 0 %
Lymphocytes Relative: 13 %
Lymphs Abs: 1.8 10*3/uL (ref 0.7–4.0)
MCH: 30.6 pg (ref 26.0–34.0)
MCHC: 32.8 g/dL (ref 30.0–36.0)
MCV: 93.1 fL (ref 80.0–100.0)
Monocytes Absolute: 1 10*3/uL (ref 0.1–1.0)
Monocytes Relative: 8 %
Neutro Abs: 10.7 10*3/uL — ABNORMAL HIGH (ref 1.7–7.7)
Neutrophils Relative %: 79 %
Platelets: 248 10*3/uL (ref 150–400)
RBC: 4.22 MIL/uL (ref 4.22–5.81)
RDW: 12.4 % (ref 11.5–15.5)
WBC: 13.6 10*3/uL — ABNORMAL HIGH (ref 4.0–10.5)
nRBC: 0 % (ref 0.0–0.2)

## 2021-06-13 MED ORDER — SULFAMETHOXAZOLE-TRIMETHOPRIM 800-160 MG PO TABS
1.0000 | ORAL_TABLET | Freq: Two times a day (BID) | ORAL | 0 refills | Status: AC
  Filled 2021-06-13: qty 14, 7d supply, fill #0

## 2021-06-13 MED ORDER — SODIUM CHLORIDE 0.9 % IV SOLN
1.0000 g | Freq: Once | INTRAVENOUS | Status: AC
  Administered 2021-06-13: 1 g via INTRAVENOUS
  Filled 2021-06-13: qty 10

## 2021-06-13 NOTE — ED Triage Notes (Signed)
Pt with abd pain, fever last week and again Friday night. Pt with indwelling catheter in place.  +chills

## 2021-06-13 NOTE — ED Notes (Signed)
Discharge instructions including prescription and follow up care discussed with pt and s/o at bedside. Pt and s/o verbalized understanding with no questions at this time. Pt to follow up with urology.

## 2021-06-13 NOTE — ED Provider Notes (Signed)
[facility] EMERGENCY DEPARTMENT Provider Note   CSN: [id number] Arrival date & time: 06/13/21  1419     History Chief Complaint  Patient presents with   Abdominal Pain    Bruce Adkins is a 65 y.o. male.  HPI  65 year old male with past medical history of BPH with outlet obstruction requiring chronic catheter, previous DVT on anticoagulation, HTN, HLD presents the emergency department with generalized illness.  Patient states about 2 weeks ago he had suprapubic discomfort, mild flank radiation.  This was followed by fever/chills and nausea with an episode of vomiting.  His symptoms have improved however now he feels generalized fatigue.  He has been having normal drainage from his urinary catheter.  Is being followed by urology outpatient.  Denies any sore throat, headache, chest pain/cough, diarrhea.  Past Medical History:  Diagnosis Date   Arthritis    BPH with obstruction/lower urinary tract symptoms    CKD (chronic kidney disease)    DVT (deep venous thrombosis) ([facility]) 2016   LLE   GERD (gastroesophageal reflux disease)    Hyperlipidemia    Hypertension    Panic attacks     Patient Active Problem List   Diagnosis Date Noted   Tachycardia 03/10/2021   Constipation 12/17/2020   Early satiety 12/17/2020   Abdominal pain 12/17/2020   Anemia 12/17/2020   BPH (benign prostatic hyperplasia) 10/21/2020   Prediabetes 10/21/2020   Recurrent deep vein thrombosis (DVT) ([facility]) 03/13/2019   HLD (hyperlipidemia) 11/01/2006   Anxiety 11/01/2006   Essential hypertension 11/01/2006   Allergic rhinitis 11/01/2006   GERD 11/01/2006   OSTEOARTHRITIS 11/01/2006    Past Surgical History:  Procedure Laterality Date   BALLOON DILATION  12/30/2020   Procedure: BALLOON DILATION;  Surgeon: Dolores Frame, MD;  [facility];  Service: Gastroenterology;;   BIOPSY  12/30/2020   Procedure: BIOPSY;  Surgeon: Dolores Frame, MD;  [facility];   Service: Gastroenterology;;   COLONOSCOPY     COLONOSCOPY WITH PROPOFOL N/A 12/30/2020   Procedure: COLONOSCOPY WITH PROPOFOL;  Surgeon: Dolores Frame, MD;  [facility];  Service: Gastroenterology;  Laterality: N/A;   ESOPHAGOGASTRODUODENOSCOPY (EGD) WITH PROPOFOL N/A 12/30/2020   Procedure: ESOPHAGOGASTRODUODENOSCOPY (EGD) WITH PROPOFOL;  Surgeon: Dolores Frame, MD;  [facility];  Service: Gastroenterology;  Laterality: N/A;  am       Family History  Problem Relation Age of Onset   Heart failure Mother    Hypertension Mother    Anxiety disorder Mother    Hypertension Father    Cancer Brother     Social History   Tobacco Use   Smoking status: Never   Smokeless tobacco: Never  Vaping Use   Vaping Use: Never used  Substance Use Topics   Alcohol use: No   Drug use: Not Currently    Home Medications Prior to Admission medications   Medication Sig Start Date End Date Taking? Authorizing Provider  acetaminophen (TYLENOL) 500 MG tablet Take 1,000 mg by mouth every 6 (six) hours as needed for mild pain.   Yes [provider]  apixaban (ELIQUIS) 5 MG TABS tablet TAKE 1 TABLET (5 MG TOTAL) BY MOUTH 2 (TWO) TIMES DAILY. 02/24/21 02/24/22 Yes Doreatha Massed, MD  baclofen (LIORESAL) 10 MG tablet Take 1 tablet (10 mg total) by mouth at bedtime as needed for muscle spasms. 05/24/21  Yes Mauro Kaufmann, NP  famotidine (PEPCID) 40 MG tablet TAKE 1 TABLET (40 MG TOTAL)  BY MOUTH DAILY. Patient taking differently: Take 40 mg by mouth daily as needed for heartburn or indigestion. 12/03/20 12/03/21 Yes Anabel Halon, MD  lisinopril (ZESTRIL) 2.5 MG tablet Take 1 tablet (2.5 mg total) by mouth daily. 05/26/21  Yes   tamsulosin (FLOMAX) 0.4 MG CAPS capsule Take 1 capsule (0.4 mg total) by mouth daily. 02/23/21  Yes Anabel Halon, MD  sodium bicarbonate 650 MG tablet Take 1 tablet (650 mg total) by mouth in the morning, in the evening, and at  bedtime. Patient not taking: Reported on 06/13/2021 01/28/21       Allergies    Patient has no known allergies.  Review of Systems   Review of Systems  Constitutional:  Positive for chills, fatigue and fever.  HENT:  Negative for congestion.   Eyes:  Negative for visual disturbance.  Respiratory:  Negative for shortness of breath.   Cardiovascular:  Negative for chest pain.  Gastrointestinal:  Positive for abdominal pain, diarrhea and vomiting.  Genitourinary:  Positive for flank pain. Negative for dysuria.  Skin:  Negative for rash.  Neurological:  Negative for headaches.   Physical Exam Updated Vital Signs BP 120/86 (BP Location: Left Arm)   Pulse 88   Temp 98.5 F (36.9 C) (Oral)   Resp 16   Ht 5\' 11"  (1.803 m)   Wt 87.7 kg   SpO2 98%   BMI 26.97 kg/m   Physical Exam Vitals and nursing note reviewed.  Constitutional:      Appearance: Normal appearance.  HENT:     Head: Normocephalic.     Mouth/Throat:     Mouth: Mucous membranes are moist.  Cardiovascular:     Rate and Rhythm: Normal rate.  Pulmonary:     Effort: Pulmonary effort is normal. No respiratory distress.  Abdominal:     Palpations: Abdomen is soft.     Tenderness: There is no abdominal tenderness.  Skin:    General: Skin is warm.  Neurological:     Mental Status: He is alert and oriented to person, place, and time. Mental status is at baseline.  Psychiatric:        Mood and Affect: Mood normal.    ED Results / Procedures / Treatments   Labs (all labs ordered are listed, but only abnormal results are displayed) Labs Reviewed  CBC WITH DIFFERENTIAL/PLATELET - Abnormal; Notable for the following components:      Result Value   WBC 13.6 (*)    Hemoglobin 12.9 (*)    Neutro Abs 10.7 (*)    All other components within normal limits  BASIC METABOLIC PANEL - Abnormal; Notable for the following components:   Sodium 131 (*)    Glucose, Bld 105 (*)    BUN 28 (*)    Creatinine, Ser 2.25 (*)     Calcium 8.3 (*)    GFR, Estimated 32 (*)    All other components within normal limits  URINALYSIS, ROUTINE W REFLEX MICROSCOPIC - Abnormal; Notable for the following components:   APPearance HAZY (*)    Hgb urine dipstick SMALL (*)    Protein, ur 30 (*)    Nitrite POSITIVE (*)    Leukocytes,Ua LARGE (*)    WBC, UA >50 (*)    Bacteria, UA MANY (*)    All other components within normal limits    EKG None  Radiology No results found.  Procedures Procedures   Medications Ordered in ED Medications - No data to display  ED Course  I have reviewed the triage vital signs and the nursing notes.  Pertinent labs & imaging results that were available during my care of the patient were reviewed by me and considered in my medical decision making (see chart for details).    MDM Rules/Calculators/A&P                           65 year old male with indwelling catheter secondary to BPH who follows with [facility] presents the emergency department with concern for suprapubic discomfort and generalized symptoms including fever, nausea/vomiting.  Catheter is still draining appropriately.  Suprapubic pain has resolved.  He is afebrile here, benign abdomen, Foley is actively draining.  Blood work shows a mild leukocytosis, kidney function is slightly higher but still within his baseline parameters.  Urinalysis shows urinary tract infection.  Urine culture has been sent.  CT renal study showed mild left hydronephrosis which was noted on recent ultrasound.  No signs of obstruction or other complication.  Patient is tolerating p.o.  No signs of sepsis.  Was given IV dose of antibiotics and will plan for outpatient regimen with close outpatient follow-up.  A secure chat has been sent to his urologist.  Patient at this time appears safe and stable for discharge and will be treated as an outpatient.  Discharge plan and strict return to ED precautions discussed, patient verbalizes understanding and  agreement.  Final Clinical Impression(s) / ED Diagnoses Final diagnoses:  None    Rx / DC Orders ED Discharge Orders     None        Rozelle Logan, DO 06/13/21 2020

## 2021-06-13 NOTE — Discharge Instructions (Addendum)
You have been seen and discharged from the emergency department.  You were found to have a urinary tract infection, urine culture has been sent.  You were given an IV dose of antibiotics in the department, start your oral antibiotics tomorrow morning of 06/14/2021.  Follow-up with urology for reevaluation and further care. Take home medications as prescribed. If you have any worsening symptoms, high fevers, nausea/vomiting or further concerns for your health please return to an emergency department for further evaluation.

## 2021-06-13 NOTE — ED Notes (Signed)
Urine culture obtained now after IV rocephin given by previous RN

## 2021-06-14 ENCOUNTER — Other Ambulatory Visit (HOSPITAL_COMMUNITY): Payer: Self-pay

## 2021-06-16 LAB — URINE CULTURE: Culture: >=100000 — AB

## 2021-06-17 ENCOUNTER — Telehealth: Payer: Self-pay | Admitting: *Deleted

## 2021-06-17 NOTE — Telephone Encounter (Signed)
Post ED Visit - Positive Culture Follow-up  Culture report reviewed by antimicrobial stewardship pharmacist: [facility] []  , Pharm.D. []  Enzo Bi, Pharm.D., BCPS AQ-ID []  , Pharm.D., BCPS []  Celedonio Miyamoto, Pharm.D., BCPS []  [place], Garvin Fila.D., BCPS, AAHIVP []  , Pharm.D., BCPS, AAHIVP []  Georgina Pillion, PharmD, BCPS []  , PharmD, BCPS []  [place], PharmD, BCPS []  [address], PharmD []  , PharmD, BCPS []  Estella Husk, PharmD  Pharmacy Team []  Lysle Pearl, PharmD []  , PharmD []  Phillips Climes, PharmD []  , Rph []  Agapito Games) , PharmD []  Verlan Friends, PharmD []  , PharmD []  Mervyn Gay, PharmD []  , PharmD []  Vinnie Level, PharmD []  [facility], PharmD []  , PharmD []  Len Childs, PharmD   Positive urine culture Treated with Sulfamethoxazole-Trimethoprim, organism sensitive to the same and no further patient follow-up is required at this time. , PharmD  Greer Pickerel Talley 06/17/2021, 9:43 AM

## 2021-06-21 ENCOUNTER — Other Ambulatory Visit: Payer: Self-pay | Admitting: Urology

## 2021-06-21 DIAGNOSIS — R338 Other retention of urine: Secondary | ICD-10-CM | POA: Diagnosis not present

## 2021-06-21 DIAGNOSIS — N401 Enlarged prostate with lower urinary tract symptoms: Secondary | ICD-10-CM | POA: Diagnosis not present

## 2021-06-23 ENCOUNTER — Ambulatory Visit (INDEPENDENT_AMBULATORY_CARE_PROVIDER_SITE_OTHER): Payer: 59

## 2021-06-23 ENCOUNTER — Other Ambulatory Visit: Payer: Self-pay

## 2021-06-23 DIAGNOSIS — N138 Other obstructive and reflux uropathy: Secondary | ICD-10-CM | POA: Diagnosis not present

## 2021-06-23 DIAGNOSIS — N401 Enlarged prostate with lower urinary tract symptoms: Secondary | ICD-10-CM | POA: Diagnosis not present

## 2021-06-23 NOTE — Progress Notes (Signed)
Fill and Pull Catheter Removal  Patient is present today for a catheter removal.  Patient was cleaned and prepped in a sterile fashion of sterile water/ saline was instilled into the bladder when the patient felt the urge to urinate. 9ml of water was then drained from the balloon.  A 18FR foley cath was removed from the bladder no complications were noted .  Patient as then given some time to void on their own.  Patient cannot void  41ml on their own after some time.  Patient tolerated well.  Performed by: Marchelle Folks RN  Follow up/ Additional notes: patient will go home for the day and return at 3:30 for PVR.

## 2021-06-23 NOTE — Progress Notes (Signed)
Pt here today for bladder scan. Bladder was scanned and 574 was visualized.   Patient reports he has been "dribbling" since he left the office today.  Reviewed PVR with Dr. Ronne Binning. Patient was taught in and out procedure today. 625cc drained from bladder after patient self cathed him self in office.  Patient instructions were given to void at home and in and out cath himself after each void. When cath output is 150cc of less. Patient voiced understanding.

## 2021-06-24 DIAGNOSIS — Z1159 Encounter for screening for other viral diseases: Secondary | ICD-10-CM | POA: Diagnosis not present

## 2021-06-28 NOTE — Progress Notes (Signed)
History of Present Illness: Here for followup of Urolift performed 9.26.2022.  Prior to the procedure he was in urinary retention.  He has been unable to void since the catheter was removed until this morning, 12 hours after he did his last intermittent catheterization.  He only had about 5 ounces voided, about 18 ounces with catheter.  He does get an urge to go.   Past Medical History:  Diagnosis Date   Arthritis    BPH with obstruction/lower urinary tract symptoms    CKD (chronic kidney disease)    DVT (deep venous thrombosis) (HCC) 2016   LLE   GERD (gastroesophageal reflux disease)    Hyperlipidemia    Hypertension    Panic attacks     Past Surgical History:  Procedure Laterality Date   BALLOON DILATION  12/30/2020   Procedure: BALLOON DILATION;  Surgeon: Dolores Frame, MD;  [facility];  Service: Gastroenterology;;   BIOPSY  12/30/2020   Procedure: BIOPSY;  Surgeon: Dolores Frame, MD;  [facility];  Service: Gastroenterology;;   COLONOSCOPY     COLONOSCOPY WITH PROPOFOL N/A 12/30/2020   Procedure: COLONOSCOPY WITH PROPOFOL;  Surgeon: Dolores Frame, MD;  [facility];  Service: Gastroenterology;  Laterality: N/A;   ESOPHAGOGASTRODUODENOSCOPY (EGD) WITH PROPOFOL N/A 12/30/2020   Procedure: ESOPHAGOGASTRODUODENOSCOPY (EGD) WITH PROPOFOL;  Surgeon: Dolores Frame, MD;  [facility];  Service: Gastroenterology;  Laterality: N/A;  am    Home Medications:  Allergies as of 06/29/2021   No Known Allergies      Medication List        Accurate as of June 28, 2021  8:22 PM. If you have any questions, ask your nurse or doctor.          acetaminophen 500 MG tablet Commonly known as: TYLENOL Take 1,000 mg by mouth every 6 (six) hours as needed for mild pain.   baclofen 10 MG tablet Commonly known as: LIORESAL Take 1 tablet (10 mg total) by mouth at bedtime as needed for muscle spasms.    Eliquis 5 MG Tabs tablet Generic drug: apixaban TAKE 1 TABLET (5 MG TOTAL) BY MOUTH 2 (TWO) TIMES DAILY.   famotidine 40 MG tablet Commonly known as: PEPCID TAKE 1 TABLET (40 MG TOTAL) BY MOUTH DAILY. What changed:  how much to take when to take this reasons to take this   lisinopril 2.5 MG tablet Commonly known as: ZESTRIL Take 1 tablet (2.5 mg total) by mouth daily.   sodium bicarbonate 650 MG tablet Take 1 tablet (650 mg total) by mouth in the morning, in the evening, and at bedtime.   tamsulosin 0.4 MG Caps capsule Commonly known as: FLOMAX Take 1 capsule (0.4 mg total) by mouth daily.        Allergies: No Known Allergies  Family History  Problem Relation Age of Onset   Heart failure Mother    Hypertension Mother    Anxiety disorder Mother    Hypertension Father    Cancer Brother     Social History:  reports that he has never smoked. He has never used smokeless tobacco. He reports that he does not currently use drugs. He reports that he does not drink alcohol.  ROS: A complete review of systems was performed.  All systems are negative except for pertinent findings as noted.  Physical Exam:  Vital signs in last 24 hours: There were no vitals taken for this visit. Constitutional:  Alert and  oriented, No acute distress Cardiovascular: Regular rate  Respiratory: Normal respiratory effort  Neurologic: Grossly intact, no focal deficits I have reviewed prior pt notes  I have reviewed notes from referring/previous physicians  I have reviewed urinalysis results I have reviewed prior PSA results    Impression/Assessment:  1.  BPH with retention.  Status post UroLift just over a week ago.  Still not voiding spontaneously.  He is getting urgency, but with a large urine volume  2.  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  He will continue self-catheterization at least 4 times a day  2.  I added bethanechol  3.  I will have him  come back in a couple of weeks for recheck

## 2021-06-29 ENCOUNTER — Telehealth: Payer: Self-pay

## 2021-06-29 ENCOUNTER — Encounter: Payer: Self-pay | Admitting: Urology

## 2021-06-29 ENCOUNTER — Other Ambulatory Visit: Payer: Self-pay

## 2021-06-29 ENCOUNTER — Ambulatory Visit: Payer: 59 | Admitting: Urology

## 2021-06-29 VITALS — BP 130/84 | HR 96 | Temp 98.4°F | Ht 71.0 in | Wt 194.2 lb

## 2021-06-29 DIAGNOSIS — N138 Other obstructive and reflux uropathy: Secondary | ICD-10-CM

## 2021-06-29 DIAGNOSIS — R339 Retention of urine, unspecified: Secondary | ICD-10-CM | POA: Diagnosis not present

## 2021-06-29 DIAGNOSIS — N401 Enlarged prostate with lower urinary tract symptoms: Secondary | ICD-10-CM | POA: Diagnosis not present

## 2021-06-29 MED ORDER — BETHANECHOL CHLORIDE 25 MG PO TABS: 25.0000 mg | ORAL_TABLET | Freq: Three times a day (TID) | ORAL | 2 refills | Status: DC

## 2021-06-29 NOTE — Telephone Encounter (Signed)
[facility] sent referral for self catheter supplies.

## 2021-06-29 NOTE — Progress Notes (Signed)
Urological Symptom Review  Patient is experiencing the following symptoms: Leakage of urine Stream starts and stops Have to strain to urinate Weak stream   Review of Systems  Gastrointestinal (upper)  : Negative for upper GI symptoms  Gastrointestinal (lower) : Negative for lower GI symptoms  Constitutional : Fever Fatigue  Skin: Negative for skin symptoms  Eyes: Negative for eye symptoms  Ear/Nose/Throat : Negative for Ear/Nose/Throat symptoms  Hematologic/Lymphatic: Negative for Hematologic/Lymphatic symptoms  Cardiovascular : Negative for cardiovascular symptoms  Respiratory : Negative for respiratory symptoms  Endocrine: Negative for endocrine symptoms  Musculoskeletal: Negative for musculoskeletal symptoms  Neurological: Negative for neurological symptoms  Psychologic: Negative for psychiatric symptoms

## 2021-07-01 DIAGNOSIS — R339 Retention of urine, unspecified: Secondary | ICD-10-CM | POA: Diagnosis not present

## 2021-07-06 ENCOUNTER — Other Ambulatory Visit (HOSPITAL_COMMUNITY): Payer: Self-pay

## 2021-07-07 ENCOUNTER — Other Ambulatory Visit: Payer: Self-pay | Admitting: Internal Medicine

## 2021-07-07 DIAGNOSIS — N400 Enlarged prostate without lower urinary tract symptoms: Secondary | ICD-10-CM

## 2021-07-08 ENCOUNTER — Other Ambulatory Visit (HOSPITAL_COMMUNITY): Payer: Self-pay

## 2021-07-08 MED ORDER — TAMSULOSIN HCL 0.4 MG PO CAPS
0.4000 mg | ORAL_CAPSULE | Freq: Every day | ORAL | 2 refills | Status: DC
  Filled 2021-07-08: qty 30, 30d supply, fill #0
  Filled 2021-08-10: qty 30, 30d supply, fill #1
  Filled 2021-09-21: qty 30, 30d supply, fill #2

## 2021-07-13 ENCOUNTER — Ambulatory Visit: Payer: 59 | Admitting: Urology

## 2021-07-13 DIAGNOSIS — N289 Disorder of kidney and ureter, unspecified: Secondary | ICD-10-CM

## 2021-07-29 ENCOUNTER — Other Ambulatory Visit: Payer: Self-pay

## 2021-07-29 ENCOUNTER — Encounter: Payer: Self-pay | Admitting: Orthopedic Surgery

## 2021-07-29 ENCOUNTER — Encounter: Payer: Self-pay | Admitting: Internal Medicine

## 2021-07-29 ENCOUNTER — Ambulatory Visit (INDEPENDENT_AMBULATORY_CARE_PROVIDER_SITE_OTHER): Payer: 59 | Admitting: Internal Medicine

## 2021-07-29 ENCOUNTER — Other Ambulatory Visit (HOSPITAL_COMMUNITY): Payer: Self-pay

## 2021-07-29 VITALS — BP 135/78 | HR 80 | Temp 98.2°F | Resp 16 | Ht 71.0 in | Wt 199.1 lb

## 2021-07-29 DIAGNOSIS — I10 Essential (primary) hypertension: Secondary | ICD-10-CM | POA: Diagnosis not present

## 2021-07-29 DIAGNOSIS — M25512 Pain in left shoulder: Secondary | ICD-10-CM | POA: Diagnosis not present

## 2021-07-29 DIAGNOSIS — M545 Low back pain, unspecified: Secondary | ICD-10-CM

## 2021-07-29 DIAGNOSIS — I82409 Acute embolism and thrombosis of unspecified deep veins of unspecified lower extremity: Secondary | ICD-10-CM

## 2021-07-29 DIAGNOSIS — K219 Gastro-esophageal reflux disease without esophagitis: Secondary | ICD-10-CM | POA: Diagnosis not present

## 2021-07-29 DIAGNOSIS — G8929 Other chronic pain: Secondary | ICD-10-CM | POA: Insufficient documentation

## 2021-07-29 DIAGNOSIS — N1832 Chronic kidney disease, stage 3b: Secondary | ICD-10-CM | POA: Diagnosis not present

## 2021-07-29 MED ORDER — BACLOFEN 10 MG PO TABS
10.0000 mg | ORAL_TABLET | Freq: Every evening | ORAL | 2 refills | Status: DC | PRN
  Filled 2021-07-29: qty 30, 30d supply, fill #0
  Filled 2021-08-25: qty 30, 30d supply, fill #1

## 2021-07-29 NOTE — Assessment & Plan Note (Signed)
Followed by Nephrology Advised to avoid oral NSAIDs On Lisinopril On Sod Bicarb tablets

## 2021-07-29 NOTE — Assessment & Plan Note (Signed)
BP Readings from Last 1 Encounters:  07/29/21 135/78   Well-controlled with Lisinopril 2.5 mg QD, followed by Nephrology Counseled for compliance with the medications Advised DASH diet and moderate exercise/walking, at least 150 mins/week

## 2021-07-29 NOTE — Assessment & Plan Note (Signed)
Well-controlled with Pepcid 

## 2021-07-29 NOTE — Assessment & Plan Note (Signed)
ROM limited due to pain Extension and supination mostly affected - could be rotator cuff injury Referred to Orthopedic surgery Advised to use heating pad or ice for swelling/pain Voltaren gel PRN

## 2021-07-29 NOTE — Patient Instructions (Signed)
Okay to take Tylenol arthritis for back pain/left shoulder pain. Please do not take Aleve or Ibuprofen.  Please take Baclofen as needed for low back pain.

## 2021-07-29 NOTE — Assessment & Plan Note (Signed)
On Eliquis Follows up with Heme/Onc. 

## 2021-07-29 NOTE — Progress Notes (Signed)
Established Patient Office Visit  Subjective:  Patient ID: Bruce Adkins, male    DOB: 17-Aug-1956  Age: 65 y.o. MRN: [id number]  CC:  Chief Complaint  Patient presents with   Back Pain    Pt having lower back pain right side pain and left top shoulder pain has been going on for 6 months since Jan 24 2021 pt has been taking motrin this helps a little     HPI Bruce Adkins is a 65 y.o. male with past medical history of recurrent unprovoked DVT, HTN, BPH, HLD, anxiety and seasonal allergies who presents for f/u of his chronic medical conditions and c/o arthralgias.  He c/o left shoulder pain for last few months, and is worse with movement, and he is not able to move shoulder beyond 90 degrees. He denies any recent injury. He has been taking Motrin recently - he is advised to avoid any oral NSAIDs due to CKD and concurrent use of Eliquis for DVT. He expressed understanding.  He also c/o chronic low back pain and has right flank/RLQ pain at times, which is worse with lying flat. He denies any recent injury or heavy lifting. He has thoracic spine X-ray recently, which was negative for any acute pathology. He has tried Baclofen, which helped with his back discomfort.  HTN: He has been taking Lisinopril 2.5 mg now. BP is well-controlled. Takes medications regularly. Patient denies headache, dizziness, chest pain, dyspnea or palpitations. He was having orthostatic hypotension - dizziness with Losartan 100 mg and it was discontinued.  CKD: Followed by Nephrology. Denies any dysuria or hematuria.  GERD: Takes Pepcid. Denies any dysphagia or odynophagia.   Past Medical History:  Diagnosis Date   Arthritis    BPH with obstruction/lower urinary tract symptoms    CKD (chronic kidney disease)    DVT (deep venous thrombosis) ([place]) 2016   LLE   GERD (gastroesophageal reflux disease)    Hyperlipidemia    Hypertension    Panic attacks     Past Surgical History:  Procedure Laterality Date    BALLOON DILATION  12/30/2020   Procedure: BALLOON DILATION;  Surgeon: Harvel Quale, MD;  [facility];  Service: Gastroenterology;;   BIOPSY  12/30/2020   Procedure: BIOPSY;  Surgeon: Harvel Quale, MD;  [facility];  Service: Gastroenterology;;   COLONOSCOPY     COLONOSCOPY WITH PROPOFOL N/A 12/30/2020   Procedure: COLONOSCOPY WITH PROPOFOL;  Surgeon: Harvel Quale, MD;  [facility];  Service: Gastroenterology;  Laterality: N/A;   ESOPHAGOGASTRODUODENOSCOPY (EGD) WITH PROPOFOL N/A 12/30/2020   Procedure: ESOPHAGOGASTRODUODENOSCOPY (EGD) WITH PROPOFOL;  Surgeon: Harvel Quale, MD;  [facility];  Service: Gastroenterology;  Laterality: N/A;  am    Family History  Problem Relation Age of Onset   Heart failure Mother    Hypertension Mother    Anxiety disorder Mother    Hypertension Father    Cancer Brother     Social History   Socioeconomic History   Marital status: Married    Spouse name: Lejon Afzal   Number of children: 3   Years of education: Not on file   Highest education level: Not on file  Occupational History   Not on file  Tobacco Use   Smoking status: Never   Smokeless tobacco: Never  Vaping Use   Vaping Use: Never used  Substance and Sexual Activity   Alcohol use: No   Drug use: Not Currently  Sexual activity: Not on file  Other Topics Concern   Not on file  Social History Narrative   Not on file   Social Determinants of Health   Financial Resource Strain: Low Risk    Difficulty of Paying Living Expenses: Not hard at all  Food Insecurity: No Food Insecurity   Worried About Running Out of Food in the Last Year: Never true   [place] in the Last Year: Never true  Transportation Needs: No Transportation Needs   Lack of Transportation (Medical): No   Lack of Transportation (Non-Medical): No  Physical Activity: Insufficiently Active   Days of Exercise per Week:  3 days   Minutes of Exercise per Session: 20 min  Stress: Stress Concern Present   Feeling of Stress : Very much  Social Connections: Moderately Integrated   Frequency of Communication with Friends and Family: More than three times a week   Frequency of Social Gatherings with Friends and Family: Once a week   Attends Religious Services: More than 4 times per year   Active Member of [employer] or Organizations: No   Attends Archivist Meetings: Never   Marital Status: Married  Human resources officer Violence: Not At Risk   Fear of Current or Ex-Partner: No   Emotionally Abused: No   Physically Abused: No   Sexually Abused: No    Outpatient Medications Prior to Visit  Medication Sig Dispense Refill   acetaminophen (TYLENOL) 500 MG tablet Take 1,000 mg by mouth every 6 (six) hours as needed for mild pain.     apixaban (ELIQUIS) 5 MG TABS tablet TAKE 1 TABLET (5 MG TOTAL) BY MOUTH 2 (TWO) TIMES DAILY. 60 tablet 3   bethanechol (URECHOLINE) 25 MG tablet Take 1 tablet (25 mg total) by mouth 3 (three) times daily. 90 tablet 2   famotidine (PEPCID) 40 MG tablet TAKE 1 TABLET (40 MG TOTAL) BY MOUTH DAILY. (Patient taking differently: Take 40 mg by mouth daily as needed for heartburn or indigestion.) 30 tablet 3   lisinopril (ZESTRIL) 2.5 MG tablet Take 1 tablet (2.5 mg total) by mouth daily. 90 tablet 3   sodium bicarbonate 650 MG tablet Take 1 tablet (650 mg total) by mouth in the morning, in the evening, and at bedtime. 90 tablet 2   tamsulosin (FLOMAX) 0.4 MG CAPS capsule Take 1 capsule (0.4 mg total) by mouth daily. 30 capsule 2   baclofen (LIORESAL) 10 MG tablet Take 1 tablet (10 mg total) by mouth at bedtime as needed for muscle spasms. 30 each 0   No facility-administered medications prior to visit.    No Known Allergies  ROS Review of Systems  Constitutional:  Negative for chills and fever.  HENT:  Negative for congestion and sore throat.   Eyes:  Negative for pain and discharge.   Respiratory:  Negative for cough and shortness of breath.   Cardiovascular:  Negative for chest pain and palpitations.  Gastrointestinal:  Positive for abdominal pain and nausea. Negative for constipation, diarrhea and vomiting.  Endocrine: Negative for polydipsia and polyuria.  Genitourinary:  Negative for dysuria and hematuria.  Musculoskeletal:  Positive for arthralgias (L shoulder) and back pain. Negative for neck pain and neck stiffness.  Skin:  Negative for rash.  Neurological:  Negative for dizziness, weakness, numbness and headaches.  Psychiatric/Behavioral:  Negative for agitation and behavioral problems.      Objective:    Physical Exam Vitals reviewed.  Constitutional:      General: He  is not in acute distress.    Appearance: He is not diaphoretic.  HENT:     Head: Normocephalic and atraumatic.     Nose: Nose normal.     Mouth/Throat:     Mouth: Mucous membranes are moist.  Eyes:     General: No scleral icterus.    Extraocular Movements: Extraocular movements intact.  Cardiovascular:     Rate and Rhythm: Normal rate and regular rhythm.     Heart sounds: No murmur heard. Pulmonary:     Breath sounds: Normal breath sounds. No wheezing or rales.  Abdominal:     General: There is no distension.     Palpations: Abdomen is soft.     Tenderness: There is no abdominal tenderness. There is no guarding or rebound.  Musculoskeletal:        General: No tenderness (Lumbar spine).     Cervical back: Neck supple. No tenderness.     Right lower leg: No edema.     Left lower leg: No edema.     Comments: ROM limited over left shoulder  Skin:    General: Skin is warm.     Findings: No rash.  Neurological:     General: No focal deficit present.     Mental Status: He is alert and oriented to person, place, and time.     Cranial Nerves: No cranial nerve deficit.     Sensory: No sensory deficit.  Psychiatric:        Mood and Affect: Mood normal.        Behavior: Behavior  normal.    BP 135/78 (BP Location: Left Arm, Patient Position: Sitting, Cuff Size: Normal)   Pulse 80   Temp 98.2 F (36.8 C) (Oral)   Resp 16   Ht 5' 11"  (1.803 m)   Wt 199 lb 1.9 oz (90.3 kg)   SpO2 98%   BMI 27.77 kg/m  Wt Readings from Last 3 Encounters:  07/29/21 199 lb 1.9 oz (90.3 kg)  06/29/21 194 lb 3.2 oz (88.1 kg)  06/13/21 193 lb 6.4 oz (87.7 kg)    Lab Results  Component Value Date   TSH 1.53 12/17/2020   Lab Results  Component Value Date   WBC 13.6 (H) 06/13/2021   HGB 12.9 (L) 06/13/2021   HCT 39.3 06/13/2021   MCV 93.1 06/13/2021   PLT 248 06/13/2021   Lab Results  Component Value Date   NA 131 (L) 06/13/2021   K 4.5 06/13/2021   CO2 23 06/13/2021   GLUCOSE 105 (H) 06/13/2021   BUN 28 (H) 06/13/2021   CREATININE 2.25 (H) 06/13/2021   BILITOT 0.6 04/08/2021   ALKPHOS 86 04/08/2021   AST 19 04/08/2021   ALT 16 04/08/2021   PROT 7.4 04/08/2021   ALBUMIN 4.1 04/08/2021   CALCIUM 8.3 (L) 06/13/2021   ANIONGAP 8 06/13/2021   EGFR 10 (L) 01/19/2021   Lab Results  Component Value Date   CHOL 167 03/11/2019   Lab Results  Component Value Date   HDL 44 03/11/2019   Lab Results  Component Value Date   LDLCALC 105 (H) 03/11/2019   Lab Results  Component Value Date   TRIG 88 03/11/2019   Lab Results  Component Value Date   CHOLHDL 3.8 03/11/2019   Lab Results  Component Value Date   HGBA1C 5.8 12/03/2020   HGBA1C 5.8 12/03/2020      Assessment & Plan:   Problem List Items Addressed This Visit  Cardiovascular and Mediastinum   Essential hypertension - Primary    BP Readings from Last 1 Encounters:  07/29/21 135/78  Well-controlled with Lisinopril 2.5 mg QD, followed by Nephrology Counseled for compliance with the medications Advised DASH diet and moderate exercise/walking, at least 150 mins/week       Recurrent deep vein thrombosis (DVT) ([place])    On Eliquis Follows up with Heme/Onc.        Digestive   GERD     Well-controlled with Pepcid        Genitourinary   Stage 3b chronic kidney disease ([place])    Followed by Nephrology Advised to avoid oral NSAIDs On Lisinopril On Sod Bicarb tablets        Other   Chronic low back pain without sciatica    Likely has referred pain to right side of back and RLQ/flank area Check X-ray lumbar spine Tylenol PRN Unable to take NSAIDs Baclofen PRN for muscle spasms      Relevant Medications   baclofen (LIORESAL) 10 MG tablet   Other Relevant Orders   DG Lumbar Spine Complete   Chronic left shoulder pain    ROM limited due to pain Extension and supination mostly affected - could be rotator cuff injury Referred to Orthopedic surgery Advised to use heating pad or ice for swelling/pain Voltaren gel PRN      Relevant Medications   baclofen (LIORESAL) 10 MG tablet   Other Relevant Orders   Ambulatory referral to Orthopedic Surgery    Meds ordered this encounter  Medications   baclofen (LIORESAL) 10 MG tablet    Sig: Take 1 tablet (10 mg total) by mouth at bedtime as needed for muscle spasms.    Dispense:  30 each    Refill:  2    Follow-up: Return in about 6 months (around 01/26/2022) for Annual physical.    Lindell Spar, MD

## 2021-07-29 NOTE — Assessment & Plan Note (Signed)
Likely has referred pain to right side of back and RLQ/flank area Check X-ray lumbar spine Tylenol PRN Unable to take NSAIDs Baclofen PRN for muscle spasms

## 2021-07-30 ENCOUNTER — Ambulatory Visit (HOSPITAL_COMMUNITY)
Admission: RE | Admit: 2021-07-30 | Discharge: 2021-07-30 | Disposition: A | Discharge status: Home / Self Care | Payer: 59 | Source: Ambulatory Visit | Attending: Internal Medicine | Admitting: Internal Medicine

## 2021-07-30 DIAGNOSIS — M545 Low back pain, unspecified: Secondary | ICD-10-CM | POA: Insufficient documentation

## 2021-07-30 DIAGNOSIS — G8929 Other chronic pain: Secondary | ICD-10-CM | POA: Insufficient documentation

## 2021-08-02 ENCOUNTER — Telehealth: Payer: Self-pay | Admitting: Internal Medicine

## 2021-08-02 ENCOUNTER — Ambulatory Visit (INDEPENDENT_AMBULATORY_CARE_PROVIDER_SITE_OTHER): Payer: 59 | Admitting: Internal Medicine

## 2021-08-02 ENCOUNTER — Other Ambulatory Visit: Payer: Self-pay

## 2021-08-02 ENCOUNTER — Encounter: Payer: Self-pay | Admitting: Internal Medicine

## 2021-08-02 DIAGNOSIS — R339 Retention of urine, unspecified: Secondary | ICD-10-CM | POA: Diagnosis not present

## 2021-08-02 DIAGNOSIS — U071 COVID-19: Secondary | ICD-10-CM | POA: Diagnosis not present

## 2021-08-02 MED ORDER — NIRMATRELVIR/RITONAVIR (PAXLOVID) TABLET (RENAL DOSING): 2.0000 | ORAL_TABLET | Freq: Two times a day (BID) | ORAL | 0 refills | Status: AC

## 2021-08-02 NOTE — Telephone Encounter (Signed)
Wife Liborio Nixon called in on pt behalf. Pt has tested positive for COVID .  Spouse wants to discuss what over the counter meds would be good for patient.  Congestion / body aches

## 2021-08-02 NOTE — Progress Notes (Incomplete)
History of Present Illness:   Here for followup of Urolift performed 9.26.2022.  Prior to the procedure he was in urinary retention.  10.4.2022: He has been unable to void since the catheter was removed until this morning, 12 hours after he did his last intermittent catheterization.  He only had about 5 ounces voided, about 18 ounces with catheter.  He does get an urge to go.  11.8.2022:  Past Medical History:  Diagnosis Date   Arthritis    BPH with obstruction/lower urinary tract symptoms    CKD (chronic kidney disease)    DVT (deep venous thrombosis) ([facility]) 2016   LLE   GERD (gastroesophageal reflux disease)    Hyperlipidemia    Hypertension    Panic attacks     Past Surgical History:  Procedure Laterality Date   BALLOON DILATION  12/30/2020   Procedure: BALLOON DILATION;  Surgeon: Dolores Frame, MD;  [facility];  Service: Gastroenterology;;   BIOPSY  12/30/2020   Procedure: BIOPSY;  Surgeon: Dolores Frame, MD;  [facility];  Service: Gastroenterology;;   COLONOSCOPY     COLONOSCOPY WITH PROPOFOL N/A 12/30/2020   Procedure: COLONOSCOPY WITH PROPOFOL;  Surgeon: Dolores Frame, MD;  [facility];  Service: Gastroenterology;  Laterality: N/A;   ESOPHAGOGASTRODUODENOSCOPY (EGD) WITH PROPOFOL N/A 12/30/2020   Procedure: ESOPHAGOGASTRODUODENOSCOPY (EGD) WITH PROPOFOL;  Surgeon: Dolores Frame, MD;  [facility];  Service: Gastroenterology;  Laterality: N/A;  am    Home Medications:  Allergies as of 08/03/2021   No Known Allergies      Medication List        Accurate as of August 02, 2021  9:34 AM. If you have any questions, ask your nurse or doctor.          acetaminophen 500 MG tablet Commonly known as: TYLENOL Take 1,000 mg by mouth every 6 (six) hours as needed for mild pain.   baclofen 10 MG tablet Commonly known as: LIORESAL Take 1 tablet (10 mg total) by mouth at bedtime as  needed for muscle spasms.   bethanechol 25 MG tablet Commonly known as: Urecholine Take 1 tablet (25 mg total) by mouth 3 (three) times daily.   Eliquis 5 MG Tabs tablet Generic drug: apixaban TAKE 1 TABLET (5 MG TOTAL) BY MOUTH 2 (TWO) TIMES DAILY.   famotidine 40 MG tablet Commonly known as: PEPCID TAKE 1 TABLET (40 MG TOTAL) BY MOUTH DAILY. What changed:  how much to take when to take this reasons to take this   lisinopril 2.5 MG tablet Commonly known as: ZESTRIL Take 1 tablet (2.5 mg total) by mouth daily.   sodium bicarbonate 650 MG tablet Take 1 tablet (650 mg total) by mouth in the morning, in the evening, and at bedtime.   tamsulosin 0.4 MG Caps capsule Commonly known as: FLOMAX Take 1 capsule (0.4 mg total) by mouth daily.        Allergies: No Known Allergies  Family History  Problem Relation Age of Onset   Heart failure Mother    Hypertension Mother    Anxiety disorder Mother    Hypertension Father    Cancer Brother     Social History:  reports that he has never smoked. He has never used smokeless tobacco. He reports that he does not currently use drugs. He reports that he does not drink alcohol.  ROS: A complete review of systems was performed.  All systems are negative except for  pertinent findings as noted.  Physical Exam:  Vital signs in last 24 hours: There were no vitals taken for this visit. Constitutional:  Alert and oriented, No acute distress Cardiovascular: Regular rate  Respiratory: Normal respiratory effort GI: Abdomen is soft, nontender, nondistended, no abdominal masses. No CVAT.  Genitourinary: Normal male phallus, testes are descended bilaterally and non-tender and without masses, scrotum is normal in appearance without lesions or masses, perineum is normal on inspection. Lymphatic: No lymphadenopathy Neurologic: Grossly intact, no focal deficits Psychiatric: Normal mood and affect  I have reviewed prior pt notes  I have  reviewed notes from referring/previous physicians  I have reviewed urinalysis results  I have independently reviewed prior imaging  I have reviewed prior PSA results  I have reviewed prior urine culture   Impression/Assessment:  ***  Plan:  ***

## 2021-08-02 NOTE — Telephone Encounter (Signed)
Pt will need to have virtual appt with provider to discuss

## 2021-08-02 NOTE — Progress Notes (Signed)
Virtual Visit via Telephone Note   This visit type was conducted due to national recommendations for restrictions regarding the COVID-19 Pandemic (e.g. social distancing) in an effort to limit this patient's exposure and mitigate transmission in our community.  Due to his co-morbid illnesses, this patient is at least at moderate risk for complications without adequate follow up.  This format is felt to be most appropriate for this patient at this time.  The patient did not have access to video technology/had technical difficulties with video requiring transitioning to audio format only (telephone).  All issues noted in this document were discussed and addressed.  No physical exam could be performed with this format.  Evaluation Performed:  Follow-up visit  Date:  08/02/2021   ID:  Bruce Adkins, DOB [birth year], MRN [id number]  Patient Location: Home Provider Location: Office/Clinic  Participants: Patient Location of Patient: Home Location of Provider: Telehealth Consent was obtain for visit to be over via telehealth. I verified that I am speaking with the correct person using two identifiers.  PCP:  Anabel Halon, MD   Chief Complaint: Cough, fatigue and myalgias  History of Present Illness:    Bruce Adkins is a 65 y.o. male who has a televisit for complaint of fatigue, cough and myalgias for last 2 days.  He tested positive for COVID today.  He denies any dyspnea or wheezing currently.  C/o subjective fever, but no chills.  He has had COVID-vaccine.  The patient does have symptoms concerning for COVID-19 infection (fever, chills, cough, or new shortness of breath).   Past Medical, Surgical, Social History, Allergies, and Medications have been Reviewed.  Past Medical History:  Diagnosis Date   Arthritis    BPH with obstruction/lower urinary tract symptoms    CKD (chronic kidney disease)    DVT (deep venous thrombosis) (HCC) 2016   LLE   GERD (gastroesophageal reflux  disease)    Hyperlipidemia    Hypertension    Panic attacks    Past Surgical History:  Procedure Laterality Date   BALLOON DILATION  12/30/2020   Procedure: BALLOON DILATION;  Surgeon: Dolores Frame, MD;  [facility]: [facility];  Service: Gastroenterology;;   BIOPSY  12/30/2020   Procedure: BIOPSY;  Surgeon: Dolores Frame, MD;  [facility];  Service: Gastroenterology;;   COLONOSCOPY     COLONOSCOPY WITH PROPOFOL N/A 12/30/2020   Procedure: COLONOSCOPY WITH PROPOFOL;  Surgeon: Dolores Frame, MD;  [facility];  Service: Gastroenterology;  Laterality: N/A;   ESOPHAGOGASTRODUODENOSCOPY (EGD) WITH PROPOFOL N/A 12/30/2020   Procedure: ESOPHAGOGASTRODUODENOSCOPY (EGD) WITH PROPOFOL;  Surgeon: Dolores Frame, MD;  [facility];  Service: Gastroenterology;  Laterality: N/A;  am     Current Meds  Medication Sig   acetaminophen (TYLENOL) 500 MG tablet Take 1,000 mg by mouth every 6 (six) hours as needed for mild pain.   apixaban (ELIQUIS) 5 MG TABS tablet TAKE 1 TABLET (5 MG TOTAL) BY MOUTH 2 (TWO) TIMES DAILY.   baclofen (LIORESAL) 10 MG tablet Take 1 tablet (10 mg total) by mouth at bedtime as needed for muscle spasms.   bethanechol (URECHOLINE) 25 MG tablet Take 1 tablet (25 mg total) by mouth 3 (three) times daily.   famotidine (PEPCID) 40 MG tablet TAKE 1 TABLET (40 MG TOTAL) BY MOUTH DAILY. (Patient taking differently: Take 40 mg by mouth daily as needed for heartburn or indigestion.)   lisinopril (ZESTRIL) 2.5 MG tablet Take  1 tablet (2.5 mg total) by mouth daily.   sodium bicarbonate 650 MG tablet Take 1 tablet (650 mg total) by mouth in the morning, in the evening, and at bedtime.   tamsulosin (FLOMAX) 0.4 MG CAPS capsule Take 1 capsule (0.4 mg total) by mouth daily.     Allergies:   Patient has no known allergies.   ROS:   Please see the history of present illness.     All other systems reviewed and are  negative.   Labs/Other Tests and Data Reviewed:    Recent Labs: 12/17/2020: TSH 1.53 04/08/2021: ALT 16 06/13/2021: BUN 28; Creatinine, Ser 2.25; Hemoglobin 12.9; Platelets 248; Potassium 4.5; Sodium 131   Recent Lipid Panel Lab Results  Component Value Date/Time   CHOL 167 03/11/2019 10:03 AM   TRIG 88 03/11/2019 10:03 AM   HDL 44 03/11/2019 10:03 AM   CHOLHDL 3.8 03/11/2019 10:03 AM   LDLCALC 105 (H) 03/11/2019 10:03 AM    Wt Readings from Last 3 Encounters:  07/29/21 199 lb 1.9 oz (90.3 kg)  06/29/21 194 lb 3.2 oz (88.1 kg)  06/13/21 193 lb 6.4 oz (87.7 kg)     ASSESSMENT & PLAN:    COVID 19 infection Started Paxlovid, renally dosed due to CKD Mucinex or Robitussin as needed for cough Tylenol as needed for fever  Time:   Today, I have spent 9 minutes reviewing the chart, including problem list, medications, and with the patient with telehealth technology discussing the above problems.   Medication Adjustments/Labs and Tests Ordered: Current medicines are reviewed at length with the patient today.  Concerns regarding medicines are outlined above.   Tests Ordered: No orders of the defined types were placed in this encounter.   Medication Changes: No orders of the defined types were placed in this encounter.    Note: This dictation was prepared with Dragon dictation along with smaller phrase technology. Similar sounding words can be transcribed inadequately or may not be corrected upon review. Any transcriptional errors that result from this process are unintentional.      Disposition:  Follow up  Signed, Anabel Halon, MD  08/02/2021 12:19 PM     [facility]

## 2021-08-03 ENCOUNTER — Ambulatory Visit: Payer: 59 | Admitting: Urology

## 2021-08-04 ENCOUNTER — Ambulatory Visit: Payer: 59 | Admitting: Orthopedic Surgery

## 2021-08-10 ENCOUNTER — Other Ambulatory Visit: Payer: Self-pay

## 2021-08-10 ENCOUNTER — Other Ambulatory Visit (HOSPITAL_COMMUNITY): Payer: Self-pay | Admitting: Hematology

## 2021-08-10 ENCOUNTER — Telehealth: Payer: Self-pay

## 2021-08-10 ENCOUNTER — Other Ambulatory Visit (HOSPITAL_COMMUNITY): Payer: Self-pay

## 2021-08-10 DIAGNOSIS — N401 Enlarged prostate with lower urinary tract symptoms: Secondary | ICD-10-CM

## 2021-08-10 DIAGNOSIS — N138 Other obstructive and reflux uropathy: Secondary | ICD-10-CM

## 2021-08-10 MED ORDER — APIXABAN 5 MG PO TABS
5.0000 mg | ORAL_TABLET | Freq: Two times a day (BID) | ORAL | 3 refills | Status: DC
  Filled 2021-08-10: qty 60, 30d supply, fill #0
  Filled 2021-09-17: qty 60, 30d supply, fill #1
  Filled 2021-10-25: qty 60, 30d supply, fill #2
  Filled 2021-11-24: qty 60, 30d supply, fill #3

## 2021-08-10 MED ORDER — BETHANECHOL CHLORIDE 25 MG PO TABS
25.0000 mg | ORAL_TABLET | Freq: Three times a day (TID) | ORAL | 2 refills | Status: DC
  Filled 2021-08-10: qty 20, 6d supply, fill #0
  Filled 2021-08-10: qty 70, 24d supply, fill #0
  Filled 2021-09-17: qty 90, 30d supply, fill #1
  Filled 2021-10-30: qty 90, 30d supply, fill #2

## 2021-08-10 NOTE — Progress Notes (Signed)
Pharmacy change

## 2021-08-10 NOTE — Telephone Encounter (Signed)
New rx sent due to pharmacy change.

## 2021-08-10 NOTE — Telephone Encounter (Signed)
Patient called and advised he needed medication refilled.   Medication: bethanechol (URECHOLINE) 25 MG tablet  Pharmacy: [facility]

## 2021-08-13 ENCOUNTER — Ambulatory Visit (INDEPENDENT_AMBULATORY_CARE_PROVIDER_SITE_OTHER): Payer: 59 | Admitting: Orthopedic Surgery

## 2021-08-13 ENCOUNTER — Ambulatory Visit: Payer: 59

## 2021-08-13 ENCOUNTER — Other Ambulatory Visit: Payer: Self-pay

## 2021-08-13 ENCOUNTER — Encounter: Payer: Self-pay | Admitting: Orthopedic Surgery

## 2021-08-13 VITALS — BP 127/80 | HR 77 | Ht 71.0 in | Wt 196.0 lb

## 2021-08-13 DIAGNOSIS — G8929 Other chronic pain: Secondary | ICD-10-CM

## 2021-08-13 DIAGNOSIS — M25512 Pain in left shoulder: Secondary | ICD-10-CM | POA: Diagnosis not present

## 2021-08-13 DIAGNOSIS — M7582 Other shoulder lesions, left shoulder: Secondary | ICD-10-CM

## 2021-08-13 NOTE — Progress Notes (Signed)
New Patient Visit  Assessment: Bruce Adkins is a 65 y.o. RHD male with the following: 1. Tendinitis of left rotator cuff  Plan: Patient has had pain in his left shoulder for the past 6 months.  He is taken Tylenol, with limited relief.  Radiographs are normal.  No specific injury.  Pain is consistent with tendinitis of the left rotator cuff.  His strength and range of motion are pretty good.  After discussing all the treatment options, he would like to proceed with a left shoulder steroid injection, and continue with home exercises.  Pending the efficacy of the injection, we could consider formal physical therapy or obtain an MRI for further evaluation.  Procedure note injection Left shoulder    Verbal consent was obtained to inject the left shoulder, subacromial space Timeout was completed to confirm the site of injection.  The skin was prepped with alcohol and ethyl chloride was sprayed at the injection site.  A 21-gauge needle was used to inject 40 mg of Depo-Medrol and 1% lidocaine (3 cc) into the subacromial space of the left shoulder using a posterolateral approach.  There were no complications. A sterile bandage was applied.   Follow-up: Return if symptoms worsen or fail to improve.  Subjective:  Chief Complaint  Patient presents with   Shoulder Pain    LT shoulder/ no known injury/ hurting x 6 mths    History of Present Illness: Bruce Adkins is a 65 y.o. male who presents for evaluation of left shoulder pain.  He states has had pain in the left shoulder for the past 6 months.  No specific injury.  He does note that he previously worked manual labor for several years, but once again has not sustained a specific injury.  The pain is over the anterior lateral aspect of the left shoulder.  He does have some pain radiating distally.  His pain gets worse at night.  He notes pain with overhead motion.  Activities below the level of his shoulder did not worsen his pain.   Review  of Systems: No fevers or chills No numbness or tingling No chest pain No shortness of breath No bowel or bladder dysfunction No GI distress No headaches   Medical History:  Past Medical History:  Diagnosis Date   Arthritis    BPH with obstruction/lower urinary tract symptoms    CKD (chronic kidney disease)    DVT (deep venous thrombosis) (HCC) 2016   LLE   GERD (gastroesophageal reflux disease)    Hyperlipidemia    Hypertension    Panic attacks     Past Surgical History:  Procedure Laterality Date   BALLOON DILATION  12/30/2020   Procedure: BALLOON DILATION;  Surgeon: Dolores Frame, MD;  [facility];  Service: Gastroenterology;;   BIOPSY  12/30/2020   Procedure: BIOPSY;  Surgeon: Dolores Frame, MD;  [facility];  Service: Gastroenterology;;   COLONOSCOPY     COLONOSCOPY WITH PROPOFOL N/A 12/30/2020   Procedure: COLONOSCOPY WITH PROPOFOL;  Surgeon: Dolores Frame, MD;  [facility];  Service: Gastroenterology;  Laterality: N/A;   ESOPHAGOGASTRODUODENOSCOPY (EGD) WITH PROPOFOL N/A 12/30/2020   Procedure: ESOPHAGOGASTRODUODENOSCOPY (EGD) WITH PROPOFOL;  Surgeon: Dolores Frame, MD;  [facility];  Service: Gastroenterology;  Laterality: N/A;  am    Family History  Problem Relation Age of Onset   Heart failure Mother    Hypertension Mother    Anxiety disorder Mother  Hypertension Father    Cancer Brother    Social History   Tobacco Use   Smoking status: Never   Smokeless tobacco: Never  Vaping Use   Vaping Use: Never used  Substance Use Topics   Alcohol use: No   Drug use: Not Currently    No Known Allergies  Current Meds  Medication Sig   acetaminophen (TYLENOL) 500 MG tablet Take 1,000 mg by mouth every 6 (six) hours as needed for mild pain.   apixaban (ELIQUIS) 5 MG TABS tablet Take 1 tablet (5 mg total) by mouth 2 (two) times daily.   baclofen (LIORESAL) 10 MG tablet  Take 1 tablet (10 mg total) by mouth at bedtime as needed for muscle spasms.   bethanechol (URECHOLINE) 25 MG tablet Take 1 tablet (25 mg total) by mouth 3 (three) times daily.   famotidine (PEPCID) 40 MG tablet TAKE 1 TABLET (40 MG TOTAL) BY MOUTH DAILY. (Patient taking differently: Take 40 mg by mouth daily as needed for heartburn or indigestion.)   lisinopril (ZESTRIL) 2.5 MG tablet Take 1 tablet (2.5 mg total) by mouth daily.   sodium bicarbonate 650 MG tablet Take 1 tablet (650 mg total) by mouth in the morning, in the evening, and at bedtime.   tamsulosin (FLOMAX) 0.4 MG CAPS capsule Take 1 capsule (0.4 mg total) by mouth daily.    Objective: BP 127/80   Pulse 77   Ht 5\' 11"  (1.803 m)   Wt 196 lb (88.9 kg)   BMI 27.34 kg/m   Physical Exam:  General: Alert and oriented. and No acute distress. Gait: Normal gait.  Left shoulder without deformity.  No swelling is appreciated.  Some tenderness to palpation within the trapezius muscle.  Forward flexion to 130 degrees with some discomfort.  Abduction at 90 degrees with some pain.  Negative O'Brien's.  Internal rotation of his lumbar spine.  External rotation at his side to 45 degrees.  5/5 strength in the deltoid, supraspinatus and infraspinatus testing.  Negative belly press.  Mild discomfort in the empty can testing position.  Fingers are warm and well-perfused.  IMAGING: I personally ordered and reviewed the following images  X-rays of the left shoulder were obtained in clinic today.  No acute injuries are noted.  No evidence of proximal humeral migration.  Minimal degenerative changes noted within the glenohumeral joint.  AC joint appears normal.  Glenohumeral joint is located.  Impression: Normal left shoulder x-ray  New Medications:  No orders of the defined types were placed in this encounter.     , MD  08/13/2021 11:25 AM

## 2021-08-13 NOTE — Patient Instructions (Signed)

## 2021-08-25 ENCOUNTER — Other Ambulatory Visit (HOSPITAL_COMMUNITY): Payer: Self-pay

## 2021-08-25 DIAGNOSIS — R809 Proteinuria, unspecified: Secondary | ICD-10-CM | POA: Diagnosis not present

## 2021-08-25 DIAGNOSIS — E211 Secondary hyperparathyroidism, not elsewhere classified: Secondary | ICD-10-CM | POA: Diagnosis not present

## 2021-08-25 DIAGNOSIS — N1832 Chronic kidney disease, stage 3b: Secondary | ICD-10-CM | POA: Diagnosis not present

## 2021-08-27 DIAGNOSIS — E211 Secondary hyperparathyroidism, not elsewhere classified: Secondary | ICD-10-CM | POA: Diagnosis not present

## 2021-08-27 DIAGNOSIS — D638 Anemia in other chronic diseases classified elsewhere: Secondary | ICD-10-CM | POA: Diagnosis not present

## 2021-08-27 DIAGNOSIS — R339 Retention of urine, unspecified: Secondary | ICD-10-CM | POA: Diagnosis not present

## 2021-08-27 DIAGNOSIS — R809 Proteinuria, unspecified: Secondary | ICD-10-CM | POA: Diagnosis not present

## 2021-08-27 DIAGNOSIS — N1832 Chronic kidney disease, stage 3b: Secondary | ICD-10-CM | POA: Diagnosis not present

## 2021-08-30 NOTE — Progress Notes (Signed)
History of Present Illness: Here for f/u of BPH w/ obstruction..  4.26.2022: Initial presentation w/ AUR w/ (B) hydro, resolved with catheter placement. Multiple voiding trial on med Rx unsuccessful.  9.26.2022: Urolift performed (TURP recommended). Prostate volume 47 mL. 7 implants placed.  12.6.2022: He is self catheterizing 3 times a day.  He does have spontaneous voids, usually early in the morning and later in the daytime.  In between, he does not void that much.  No problems with catheterizing, no blood in the urine, no dysuria.  Renal function is stable, followed by Dr. Wolfgang Phoenix.  He has not had recent renal ultrasound.   Past Medical History:  Diagnosis Date   Arthritis    BPH with obstruction/lower urinary tract symptoms    CKD (chronic kidney disease)    DVT (deep venous thrombosis) (HCC) 2016   LLE   GERD (gastroesophageal reflux disease)    Hyperlipidemia    Hypertension    Panic attacks     Past Surgical History:  Procedure Laterality Date   BALLOON DILATION  12/30/2020   Procedure: BALLOON DILATION;  Surgeon: Dolores Frame, MD;  [facility];  Service: Gastroenterology;;   BIOPSY  12/30/2020   Procedure: BIOPSY;  Surgeon: Dolores Frame, MD;  [facility];  Service: Gastroenterology;;   COLONOSCOPY     COLONOSCOPY WITH PROPOFOL N/A 12/30/2020   Procedure: COLONOSCOPY WITH PROPOFOL;  Surgeon: Dolores Frame, MD;  [facility];  Service: Gastroenterology;  Laterality: N/A;   ESOPHAGOGASTRODUODENOSCOPY (EGD) WITH PROPOFOL N/A 12/30/2020   Procedure: ESOPHAGOGASTRODUODENOSCOPY (EGD) WITH PROPOFOL;  Surgeon: Dolores Frame, MD;  [facility];  Service: Gastroenterology;  Laterality: N/A;  am    Home Medications:  Allergies as of 08/31/2021   No Known Allergies      Medication List        Accurate as of August 30, 2021  7:16 PM. If you have any questions, ask your nurse or doctor.           acetaminophen 500 MG tablet Commonly known as: TYLENOL Take 1,000 mg by mouth every 6 (six) hours as needed for mild pain.   baclofen 10 MG tablet Commonly known as: LIORESAL Take 1 tablet (10 mg total) by mouth at bedtime as needed for muscle spasms.   bethanechol 25 MG tablet Commonly known as: Urecholine Take 1 tablet (25 mg total) by mouth 3 (three) times daily.   Eliquis 5 MG Tabs tablet Generic drug: apixaban Take 1 tablet (5 mg total) by mouth 2 (two) times daily.   famotidine 40 MG tablet Commonly known as: PEPCID TAKE 1 TABLET (40 MG TOTAL) BY MOUTH DAILY. What changed:  how much to take when to take this reasons to take this   lisinopril 2.5 MG tablet Commonly known as: ZESTRIL Take 1 tablet (2.5 mg total) by mouth daily.   sodium bicarbonate 650 MG tablet Take 1 tablet (650 mg total) by mouth in the morning, in the evening, and at bedtime.   tamsulosin 0.4 MG Caps capsule Commonly known as: FLOMAX Take 1 capsule (0.4 mg total) by mouth daily.        Allergies: No Known Allergies  Family History  Problem Relation Age of Onset   Heart failure Mother    Hypertension Mother    Anxiety disorder Mother    Hypertension Father    Cancer Brother     Social History:  reports that he has never smoked. He  has never used smokeless tobacco. He reports that he does not currently use drugs. He reports that he does not drink alcohol.  ROS: A complete review of systems was performed.  All systems are negative except for pertinent findings as noted.  Physical Exam:  Vital signs in last 24 hours: There were no vitals taken for this visit. Constitutional:  Alert and oriented, No acute distress Cardiovascular: Regular rate  Respiratory: Normal respiratory effort Neurologic: Grossly intact, no focal deficits Psychiatric: Normal mood and affect  I have reviewed prior pt notes  I have reviewed notes from referring/previous physicians  I have reviewed  urinalysis results  I have independently reviewed prior imaging--most recent imaging of the kidneys from July of this year with CT scan  I have reviewed prior PSA results--PSA normal earlier this year  I have reviewed prior urine culture   Impression/Assessment:  1.  BPH with history of retention.  Status post UroLift and September of this year.  Still with fairly large residuals but some voiding.  Still reliant on self catheterizing  2.  Renal insufficiency/acute renal failure with his urinary retention initially.  Stable creatinine, followed by nephrologist  Plan:  1.  For now, continue self catheterizing as needed  2.  I will order renal ultrasound  3.  Office visit in 3 months.  Plan on cystoscopy if he is not voiding better.  I let him know that we may need to do a repeat procedure

## 2021-08-31 ENCOUNTER — Encounter: Payer: Self-pay | Admitting: Urology

## 2021-08-31 ENCOUNTER — Other Ambulatory Visit: Payer: Self-pay

## 2021-08-31 ENCOUNTER — Ambulatory Visit: Payer: 59 | Admitting: Urology

## 2021-08-31 ENCOUNTER — Ambulatory Visit (INDEPENDENT_AMBULATORY_CARE_PROVIDER_SITE_OTHER): Payer: 59 | Admitting: Urology

## 2021-08-31 VITALS — BP 130/75 | HR 87

## 2021-08-31 DIAGNOSIS — N1330 Unspecified hydronephrosis: Secondary | ICD-10-CM | POA: Diagnosis not present

## 2021-08-31 DIAGNOSIS — R339 Retention of urine, unspecified: Secondary | ICD-10-CM

## 2021-08-31 DIAGNOSIS — N138 Other obstructive and reflux uropathy: Secondary | ICD-10-CM

## 2021-08-31 DIAGNOSIS — N401 Enlarged prostate with lower urinary tract symptoms: Secondary | ICD-10-CM

## 2021-08-31 LAB — URINALYSIS, ROUTINE W REFLEX MICROSCOPIC
Bilirubin, UA: NEGATIVE
Glucose, UA: NEGATIVE
Ketones, UA: NEGATIVE
Nitrite, UA: POSITIVE — AB
Protein,UA: NEGATIVE
Specific Gravity, UA: 1.015 (ref 1.005–1.030)
Urobilinogen, Ur: 0.2 mg/dL (ref 0.2–1.0)
pH, UA: 5.5 (ref 5.0–7.5)

## 2021-08-31 LAB — BLADDER SCAN AMB NON-IMAGING: Scan Result: >531

## 2021-08-31 LAB — MICROSCOPIC EXAMINATION
Epithelial Cells (non renal): NONE SEEN /hpf (ref 0–10)
Renal Epithel, UA: NONE SEEN /hpf
WBC, UA: >30 /hpf — AB (ref 0–5)

## 2021-08-31 NOTE — Addendum Note (Signed)
Addended by: Gustavus Messing on: 08/31/2021 12:10 PM   Modules accepted: Orders

## 2021-08-31 NOTE — Progress Notes (Signed)
post void residual =>531 ml

## 2021-08-31 NOTE — Progress Notes (Signed)
Urological Symptom Review  Patient is experiencing the following symptoms: Stream starts and stops Trouble starting stream Have to strain to urinate Weak stream   Review of Systems  Gastrointestinal (upper)  : Negative for upper GI symptoms  Gastrointestinal (lower) : Negative for lower GI symptoms  Constitutional : Negative for symptoms  Skin: Negative for skin symptoms  Eyes: Negative for eye symptoms  Ear/Nose/Throat : Negative for Ear/Nose/Throat symptoms  Hematologic/Lymphatic: Negative for Hematologic/Lymphatic symptoms  Cardiovascular : Negative for cardiovascular symptoms  Respiratory : Negative for respiratory symptoms  Endocrine: Negative for endocrine symptoms  Musculoskeletal: Negative for musculoskeletal symptoms  Neurological: Negative for neurological symptoms  Psychologic: Negative for psychiatric symptoms

## 2021-09-08 ENCOUNTER — Ambulatory Visit (HOSPITAL_COMMUNITY)
Admission: RE | Admit: 2021-09-08 | Discharge: 2021-09-08 | Disposition: A | Discharge status: Home / Self Care | Payer: 59 | Source: Ambulatory Visit | Attending: Urology | Admitting: Urology

## 2021-09-08 ENCOUNTER — Other Ambulatory Visit: Payer: Self-pay

## 2021-09-08 DIAGNOSIS — N1330 Unspecified hydronephrosis: Secondary | ICD-10-CM | POA: Diagnosis not present

## 2021-09-08 DIAGNOSIS — N281 Cyst of kidney, acquired: Secondary | ICD-10-CM | POA: Diagnosis not present

## 2021-09-13 ENCOUNTER — Telehealth: Payer: Self-pay

## 2021-09-13 NOTE — Telephone Encounter (Signed)
-----   Message from Marcine Matar, MD sent at 09/12/2021  9:47 PM EST ----- Notify pt--good news--both kidneys draining well--looks good! ----- Message ----- From: Bonnita Levan, CMA Sent: 09/09/2021   2:41 PM EST To: Marcine Matar, MD  Please review

## 2021-09-13 NOTE — Telephone Encounter (Signed)
Pt was called and vm left for him to give a call back to the office.

## 2021-09-17 ENCOUNTER — Other Ambulatory Visit (HOSPITAL_COMMUNITY): Payer: Self-pay

## 2021-09-21 ENCOUNTER — Other Ambulatory Visit (HOSPITAL_COMMUNITY): Payer: Self-pay

## 2021-09-21 ENCOUNTER — Ambulatory Visit: Payer: 59 | Admitting: Urology

## 2021-09-22 ENCOUNTER — Other Ambulatory Visit (HOSPITAL_COMMUNITY): Payer: Self-pay

## 2021-09-22 ENCOUNTER — Telehealth: Payer: Self-pay

## 2021-09-22 NOTE — Telephone Encounter (Signed)
Patient called today for u/s results.  Message sent to MD

## 2021-09-28 NOTE — Telephone Encounter (Signed)
Patient called and notified of results- voiced understanding.  ?

## 2021-09-29 DIAGNOSIS — R339 Retention of urine, unspecified: Secondary | ICD-10-CM | POA: Diagnosis not present

## 2021-09-30 ENCOUNTER — Telehealth: Payer: Self-pay | Admitting: Orthopedic Surgery

## 2021-09-30 NOTE — Telephone Encounter (Signed)
Patient called to request to see Dr Aline Brochure for the same problem of left shoulder pain for which he saw Dr Amedeo Kinsman on 08/13/21; states the injection lasted only a couple of days; states he has been doing the home exercises. Please advise.

## 2021-10-01 NOTE — Telephone Encounter (Signed)
Per Dr Romeo Apple, message received in response: "Y? Thats Dr Jillyn Hidden px. He has to ok the switch"  - routed to Dr Dallas Schimke for advice

## 2021-10-05 NOTE — Telephone Encounter (Signed)
Called back to patient; offered appointment accordingly; aware.

## 2021-10-18 ENCOUNTER — Inpatient Hospital Stay (HOSPITAL_COMMUNITY): Payer: 59 | Attending: Hematology

## 2021-10-21 ENCOUNTER — Ambulatory Visit: Payer: 59 | Admitting: Orthopedic Surgery

## 2021-10-21 ENCOUNTER — Encounter: Payer: Self-pay | Admitting: Orthopedic Surgery

## 2021-10-21 ENCOUNTER — Other Ambulatory Visit: Payer: Self-pay

## 2021-10-21 ENCOUNTER — Other Ambulatory Visit (HOSPITAL_COMMUNITY): Payer: Self-pay

## 2021-10-21 VITALS — Ht 71.0 in | Wt 196.0 lb

## 2021-10-21 DIAGNOSIS — M7502 Adhesive capsulitis of left shoulder: Secondary | ICD-10-CM

## 2021-10-21 MED ORDER — PREDNISONE 10 MG (48) PO TBPK
ORAL_TABLET | Freq: Every day | ORAL | 0 refills | Status: DC
  Filled 2021-10-21: qty 48, 12d supply, fill #0

## 2021-10-21 MED ORDER — TRAMADOL HCL 50 MG PO TABS
50.0000 mg | ORAL_TABLET | Freq: Four times a day (QID) | ORAL | 5 refills | Status: DC | PRN
  Filled 2021-10-21: qty 60, 15d supply, fill #0
  Filled 2022-01-24: qty 60, 15d supply, fill #1

## 2021-10-21 NOTE — Patient Instructions (Signed)
Physical therapy has been ordered for you at [facility]. They should call you to schedule, [phone] is the phone number to call, if you want to call to schedule.   

## 2021-10-21 NOTE — Progress Notes (Signed)
Chief Complaint  Patient presents with   Shoulder Pain    Left     66 year old male on Eliquis complains of left shoulder pain  Previously seen by Dr. Rosey Bath  He had an injection it helped for about a day and now he thinks the shoulder is worse.  He has difficulty sleeping on his left or right side  His exam is consistent with adhesive capsulitis he has decreased external rotation on the left compared to the right he has decreased internal rotation only reaching his hip pocket  His forward elevation is less than 90 degrees and his scapulothoracic rhythm is disrupted as well  He had an x-ray done which was normal  Recommend physical therapy for 6 weeks  We discussed possible balloon dilatation if the therapy does not help  He was placed on a steroid and tramadol.  Meds ordered this encounter  Medications   predniSONE (STERAPRED UNI-PAK 48 TAB) 10 MG (48) TBPK tablet    Sig: Take by mouth daily. 10mg  12days    Dispense:  48 tablet    Refill:  0   traMADol (ULTRAM) 50 MG tablet    Sig: Take 1 tablet (50 mg total) by mouth every 6 (six) hours as needed.    Dispense:  60 tablet    Refill:  5

## 2021-10-25 ENCOUNTER — Other Ambulatory Visit: Payer: Self-pay | Admitting: Internal Medicine

## 2021-10-25 ENCOUNTER — Ambulatory Visit (HOSPITAL_COMMUNITY): Payer: 59 | Admitting: Hematology

## 2021-10-25 ENCOUNTER — Other Ambulatory Visit (HOSPITAL_COMMUNITY): Payer: Self-pay

## 2021-10-25 DIAGNOSIS — N400 Enlarged prostate without lower urinary tract symptoms: Secondary | ICD-10-CM

## 2021-10-25 MED ORDER — TAMSULOSIN HCL 0.4 MG PO CAPS
0.4000 mg | ORAL_CAPSULE | Freq: Every day | ORAL | 2 refills | Status: DC
  Filled 2021-10-25: qty 30, 30d supply, fill #0
  Filled 2021-11-24: qty 30, 30d supply, fill #1
  Filled 2022-02-07: qty 30, 30d supply, fill #2

## 2021-10-26 DIAGNOSIS — R339 Retention of urine, unspecified: Secondary | ICD-10-CM | POA: Diagnosis not present

## 2021-11-01 ENCOUNTER — Other Ambulatory Visit (HOSPITAL_COMMUNITY): Payer: Self-pay

## 2021-11-02 ENCOUNTER — Other Ambulatory Visit (HOSPITAL_COMMUNITY): Payer: Self-pay

## 2021-11-03 ENCOUNTER — Encounter (HOSPITAL_COMMUNITY): Payer: Self-pay | Admitting: Occupational Therapy

## 2021-11-03 ENCOUNTER — Other Ambulatory Visit: Payer: Self-pay

## 2021-11-03 ENCOUNTER — Ambulatory Visit (HOSPITAL_COMMUNITY): Payer: 59 | Attending: Orthopedic Surgery | Admitting: Occupational Therapy

## 2021-11-03 DIAGNOSIS — R29898 Other symptoms and signs involving the musculoskeletal system: Secondary | ICD-10-CM | POA: Diagnosis not present

## 2021-11-03 DIAGNOSIS — M25612 Stiffness of left shoulder, not elsewhere classified: Secondary | ICD-10-CM | POA: Diagnosis not present

## 2021-11-03 DIAGNOSIS — M25512 Pain in left shoulder: Secondary | ICD-10-CM | POA: Insufficient documentation

## 2021-11-03 DIAGNOSIS — G8929 Other chronic pain: Secondary | ICD-10-CM | POA: Insufficient documentation

## 2021-11-03 NOTE — Therapy (Signed)
[place], [place], [address] Phone: [phone]   Fax:  [phone]  Occupational Therapy Evaluation  Patient Details  Name: Bruce Adkins MRN: [id number] Date of Birth: 03/31/1956 Referring Provider (OT): Dr. Arther Abbott   Encounter Date: 11/03/2021   OT End of Session - 11/03/21 0849     Visit Number 1    Number of Visits 16    Date for OT Re-Evaluation 01/02/22   mini-reassessment 12/02/2021   Authorization Type [name], $20 copay    Authorization Time Period 25 visit limit, auth needed after 25th visit    Authorization - Visit Number 1    Authorization - Number of Visits 25    OT Start Time [id number]    OT Stop Time [id number]    OT Time Calculation (min) 31 min    Activity Tolerance Patient tolerated treatment well    Behavior During Therapy [facility] for tasks assessed/performed             Past Medical History:  Diagnosis Date   Arthritis    BPH with obstruction/lower urinary tract symptoms    CKD (chronic kidney disease)    DVT (deep venous thrombosis) ([place]) 2016   LLE   GERD (gastroesophageal reflux disease)    Hyperlipidemia    Hypertension    Panic attacks     Past Surgical History:  Procedure Laterality Date   BALLOON DILATION  12/30/2020   Procedure: BALLOON DILATION;  Surgeon: Harvel Quale, MD;  [facility];  Service: Gastroenterology;;   BIOPSY  12/30/2020   Procedure: BIOPSY;  Surgeon: Harvel Quale, MD;  [facility];  Service: Gastroenterology;;   COLONOSCOPY     COLONOSCOPY WITH PROPOFOL N/A 12/30/2020   Procedure: COLONOSCOPY WITH PROPOFOL;  Surgeon: Harvel Quale, MD;  [facility];  Service: Gastroenterology;  Laterality: N/A;   ESOPHAGOGASTRODUODENOSCOPY (EGD) WITH PROPOFOL N/A 12/30/2020   Procedure: ESOPHAGOGASTRODUODENOSCOPY (EGD) WITH PROPOFOL;  Surgeon: Harvel Quale, MD;  [facility];   Service: Gastroenterology;  Laterality: N/A;  am    There were no vitals filed for this visit.   Subjective Assessment - 11/03/21 0847     Subjective  S: It began hurting probably six to eight months ago.    Pertinent History Pt is a 66 y/o male presenting with left adhesive capsulitis that began mid-2022 with no known cause. Pt had x-rays which were normal, has not had additional imaging. Pt was referred to occupational therapy for evaluation and treatment by Dr. Arther Abbott.    Special Tests FOTO: 62/100    Patient Stated Goals To have less pain and be able to move my arm.    Currently in Pain? No/denies               [facility] OT Assessment - 11/03/21 0814       Assessment   Medical Diagnosis left adhesive capsulitis    Referring Provider (OT) Dr. Arther Abbott    Onset Date/Surgical Date 02/24/21   approximate onset   Hand Dominance Right    Next MD Visit 12/09/21    Prior Therapy None      Precautions   Precautions None      Restrictions   Weight Bearing Restrictions No      Balance Screen   Has the patient fallen in the past 6 months No      Prior Function   Level  of Independence Independent    Vocation Full time employment    Systems analyst, musician    Leisure playing guitar      ADL   ADL comments Pt is having difficulty with dressing, reaching overhead, behind back. Has difficulty sleeping, wakes up and turns a lot. Lifting guitar up. Turning steering wheel when driving.      Written Expression   Dominant Hand Left      Observation/Other Assessments   Focus on Therapeutic Outcomes (FOTO)  62/100      ROM / Strength   AROM / PROM / Strength AROM;PROM;Strength      Palpation   Palpation comment min/mod fascial restrictions along left upper arm, trapezius, and anterior shoulder      AROM   Overall AROM Comments Assessed seated, er/IR adducted    AROM Assessment Site Shoulder    Right/Left Shoulder Left    Left Shoulder Flexion  112 Degrees    Left Shoulder ABduction 72 Degrees    Left Shoulder Internal Rotation 90 Degrees    Left Shoulder External Rotation 38 Degrees      PROM   Overall PROM Comments Assessed supine, er/IR adducted    PROM Assessment Site Shoulder    Right/Left Shoulder Left    Left Shoulder Flexion 133 Degrees    Left Shoulder ABduction 125 Degrees    Left Shoulder Internal Rotation 90 Degrees    Left Shoulder External Rotation 45 Degrees      Strength   Overall Strength Comments Assessed seated, er/IR adducted    Strength Assessment Site Shoulder    Right/Left Shoulder Left    Left Shoulder Flexion 5/5    Left Shoulder ABduction 5/5    Left Shoulder Internal Rotation 5/5    Left Shoulder External Rotation 5/5                              OT Education - 11/03/21 0836     Education Details AA/ROM exercises    Person(s) Educated Patient    Methods Explanation;Demonstration;Handout    Comprehension Verbalized understanding;Returned demonstration              OT Short Term Goals - 11/03/21 0853       OT SHORT TERM GOAL #1   Title Pt will be provided with and educated on HEP to improve mobility of LUE required for ADL completion.    Time 4    Period Weeks    Status New    Target Date 12/03/21      OT SHORT TERM GOAL #2   Title Pt will increase LUE P/ROM to [facility] to improve ability to perform dressing tasks with minimal compensatory strategies and no physical assistance.    Time 4    Period Weeks    Status New               OT Long Term Goals - 11/03/21 0854       OT LONG TERM GOAL #1   Title Pt will decrease pain in LUE to 3/10 or less to improve ability to sleep in position of comfort for 3+ hours without waking due to pain and needing to turn over.    Time 8    Period Weeks    Status New    Target Date 01/02/22      OT LONG TERM GOAL #2   Title Pt will decrease LUE fascial restrictions to trace  amounts to improve mobility required for  functional reaching tasks.    Time 8    Period Weeks    Status New      OT LONG TERM GOAL #3   Title Pt will increase LUE A/ROM to improve ability to reach overhead and behind back when reaching for objects or performing dressing/bathing tasks.    Time 8    Period Weeks    Status New      OT LONG TERM GOAL #4   Title Pt will increase activity tolerance in LUE to improve ability to work overhead for more than 2 minutes when completing home chores/repairs.    Time 8    Period Weeks    Status New                   Plan - 11/03/21 0850     Clinical Impression Statement A: Pt is a 66 y/o male presenting with left adhesive capsulitis causing pain and limited functional use of LUE during ADLs. Pt with capsular end feel during passive stretching. Strength remains good in all ranges.    OT Occupational Profile and History Problem Focused Assessment - Including review of records relating to presenting problem    Occupational performance deficits (Please refer to evaluation for details): ADL's;IADL's;Rest and Sleep;Leisure    Body Structure / Function / Physical Skills ADL;Endurance;Muscle spasms;UE functional use;Fascial restriction;Pain;ROM;IADL    Rehab Potential Good    Clinical Decision Making Limited treatment options, no task modification necessary    Comorbidities Affecting Occupational Performance: None    Modification or Assistance to Complete Evaluation  No modification of tasks or assist necessary to complete eval    OT Frequency 2x / week    OT Duration 8 weeks    OT Treatment/Interventions Self-care/ADL training;Ultrasound;Patient/family education;DME and/or AE instruction;Passive range of motion;Electrical Stimulation;Moist Heat;Therapeutic exercise;Manual Therapy;Therapeutic activities    Plan P: Pt will benefit from skilled OT services to decrease pain and fascial restrictions, increase ROM and functional use of LUE during ADLs. Treatment plan: Manual techniques,  myofascial release, P/ROM, AA/ROM, A/ROM, scapular mobility/stability/strengthening, modalities prn    OT Home Exercise Plan eval: AA/ROM exercises    Consulted and Agree with Plan of Care Patient             Patient will benefit from skilled therapeutic intervention in order to improve the following deficits and impairments:   Body Structure / Function / Physical Skills: ADL, Endurance, Muscle spasms, UE functional use, Fascial restriction, Pain, ROM, IADL       Visit Diagnosis: Chronic left shoulder pain  Stiffness of left shoulder, not elsewhere classified  Other symptoms and signs involving the musculoskeletal system    Problem List Patient Active Problem List   Diagnosis Date Noted   Chronic low back pain without sciatica 07/29/2021   Chronic left shoulder pain 07/29/2021   Stage 3b chronic kidney disease (HCC) 07/29/2021   Tachycardia 03/10/2021   Constipation 12/17/2020   Early satiety 12/17/2020   Abdominal pain 12/17/2020   Anemia 12/17/2020   BPH (benign prostatic hyperplasia) 10/21/2020   Prediabetes 10/21/2020   Recurrent deep vein thrombosis (DVT) (HCC) 03/13/2019   HLD (hyperlipidemia) 11/01/2006   Anxiety 11/01/2006   Essential hypertension 11/01/2006   Allergic rhinitis 11/01/2006   GERD 11/01/2006   OSTEOARTHRITIS 11/01/2006    Ezra SitesLeslie Eniyah Eastmond, OTR/L  [phone] 11/03/2021, 8:57 AM  [facility] [address] [place] [place], [place], [address] Phone: [phone]   Fax:  [phone]  Name: Bruce Adkins MRN: [id number] Date of Birth: 12-16-1955

## 2021-11-03 NOTE — Patient Instructions (Signed)

## 2021-11-11 ENCOUNTER — Ambulatory Visit (HOSPITAL_COMMUNITY): Payer: 59 | Admitting: Occupational Therapy

## 2021-11-11 ENCOUNTER — Encounter (HOSPITAL_COMMUNITY): Payer: Self-pay | Admitting: Occupational Therapy

## 2021-11-11 ENCOUNTER — Other Ambulatory Visit: Payer: Self-pay

## 2021-11-11 DIAGNOSIS — M25612 Stiffness of left shoulder, not elsewhere classified: Secondary | ICD-10-CM

## 2021-11-11 DIAGNOSIS — R29898 Other symptoms and signs involving the musculoskeletal system: Secondary | ICD-10-CM

## 2021-11-11 DIAGNOSIS — G8929 Other chronic pain: Secondary | ICD-10-CM | POA: Diagnosis not present

## 2021-11-11 DIAGNOSIS — M25512 Pain in left shoulder: Secondary | ICD-10-CM

## 2021-11-11 NOTE — Therapy (Signed)
[place] [address] [place], [place], [address] Phone: [phone]   Fax:  [phone]  Occupational Therapy Treatment  Patient Details  Name: Bruce Adkins MRN: [id number] Date of Birth: [birth year] Referring Provider (OT): Dr. Arther Abbott   Encounter Date: 11/11/2021   OT End of Session - 11/11/21 1340     Visit Number 2    Number of Visits 16    Date for OT Re-Evaluation 01/02/22   mini-reassessment 12/02/2021   Authorization Type [name], $20 copay    Authorization Time Period 25 visit limit, auth needed after 25th visit    Authorization - Visit Number 2    Authorization - Number of Visits 25    OT Start Time 1302    OT Stop Time 1340    OT Time Calculation (min) 38 min    Activity Tolerance Patient tolerated treatment well    Behavior During Therapy [facility] for tasks assessed/performed             Past Medical History:  Diagnosis Date   Arthritis    BPH with obstruction/lower urinary tract symptoms    CKD (chronic kidney disease)    DVT (deep venous thrombosis) ([place]) 2016   LLE   GERD (gastroesophageal reflux disease)    Hyperlipidemia    Hypertension    Panic attacks     Past Surgical History:  Procedure Laterality Date   BALLOON DILATION  12/30/2020   Procedure: BALLOON DILATION;  Surgeon: Harvel Quale, MD;  [facility];  Service: Gastroenterology;;   BIOPSY  12/30/2020   Procedure: BIOPSY;  Surgeon: Harvel Quale, MD;  [facility];  Service: Gastroenterology;;   COLONOSCOPY     COLONOSCOPY WITH PROPOFOL N/A 12/30/2020   Procedure: COLONOSCOPY WITH PROPOFOL;  Surgeon: Harvel Quale, MD;  [facility];  Service: Gastroenterology;  Laterality: N/A;   ESOPHAGOGASTRODUODENOSCOPY (EGD) WITH PROPOFOL N/A 12/30/2020   Procedure: ESOPHAGOGASTRODUODENOSCOPY (EGD) WITH PROPOFOL;  Surgeon: Harvel Quale, MD;  [facility];   Service: Gastroenterology;  Laterality: N/A;  am    There were no vitals filed for this visit.   Subjective Assessment - 11/11/21 1300     Subjective  S: I've been doing my exercises.    Currently in Pain? No/denies                [facility] OT Assessment - 11/11/21 1300       Assessment   Medical Diagnosis left adhesive capsulitis      Precautions   Precautions None                      OT Treatments/Exercises (OP) - 11/11/21 1303       Exercises   Exercises Shoulder      Shoulder Exercises: Supine   Protraction PROM;5 reps;AROM;10 reps    Horizontal ABduction PROM;5 reps;AROM;10 reps    External Rotation PROM;5 reps;AAROM;10 reps    Internal Rotation PROM;5 reps;AAROM;10 reps    Flexion PROM;5 reps;AAROM;10 reps    ABduction PROM;5 reps;AAROM;10 reps      Shoulder Exercises: Standing   Protraction AROM;10 reps    Horizontal ABduction AROM;10 reps    External Rotation AAROM;10 reps    Internal Rotation AAROM;10 reps    Flexion AAROM;10 reps    ABduction AAROM;10 reps      Shoulder Exercises: Pulleys   Flexion 1 minute    ABduction 1  minute      Shoulder Exercises: Stretch   Cross Chest Stretch 3 reps;10 seconds    Internal Rotation Stretch 3 reps   10" horizontal towel   External Rotation Stretch 3 reps;10 seconds    Wall Stretch - Flexion 3 reps;10 seconds    Wall Stretch - ABduction 3 reps;10 seconds    Other Shoulder Stretches doorway stretch, 3x10"      Manual Therapy   Manual Therapy Myofascial release    Manual therapy comments completed separately from therapeutic exercises    Myofascial Release myofascial release and manual techniques to left upper arm, trapezius, scapular regions to decrease pain and fascial restrictions and increase joint ROM                      OT Short Term Goals - 11/11/21 1300       OT SHORT TERM GOAL #1   Title Pt will be provided with and educated on HEP to improve mobility of LUE required  for ADL completion.    Time 4    Period Weeks    Status On-going    Target Date 12/03/21      OT SHORT TERM GOAL #2   Title Pt will increase LUE P/ROM to [facility] to improve ability to perform dressing tasks with minimal compensatory strategies and no physical assistance.    Time 4    Period Weeks    Status On-going               OT Long Term Goals - 11/11/21 1300       OT LONG TERM GOAL #1   Title Pt will decrease pain in LUE to 3/10 or less to improve ability to sleep in position of comfort for 3+ hours without waking due to pain and needing to turn over.    Time 8    Period Weeks    Status On-going    Target Date 01/02/22      OT LONG TERM GOAL #2   Title Pt will decrease LUE fascial restrictions to trace amounts to improve mobility required for functional reaching tasks.    Time 8    Period Weeks    Status On-going      OT LONG TERM GOAL #3   Title Pt will increase LUE A/ROM to improve ability to reach overhead and behind back when reaching for objects or performing dressing/bathing tasks.    Time 8    Period Weeks    Status On-going      OT LONG TERM GOAL #4   Title Pt will increase activity tolerance in LUE to improve ability to work overhead for more than 2 minutes when completing home chores/repairs.    Time 8    Period Weeks    Status On-going                   Plan - 11/11/21 1341     Clinical Impression Statement A: Pt reports HEP is going well, feels he can raise his arm a little further. Initiated myofascial release and manual techniques, passive stretching completed. Pt completing a combination of AA/ROM and A/ROM in supine and standing, shoulder stretches and pulleys completed as well. Verbal cuing for form and technique.    Body Structure / Function / Physical Skills ADL;Endurance;Muscle spasms;UE functional use;Fascial restriction;Pain;ROM;IADL    Plan P: Continue with shoulder stretches and add to HEP, trial scapular theraband    OT Home  Exercise  Plan eval: AA/ROM exercises    Consulted and Agree with Plan of Care Patient             Patient will benefit from skilled therapeutic intervention in order to improve the following deficits and impairments:   Body Structure / Function / Physical Skills: ADL, Endurance, Muscle spasms, UE functional use, Fascial restriction, Pain, ROM, IADL       Visit Diagnosis: Chronic left shoulder pain  Stiffness of left shoulder, not elsewhere classified  Other symptoms and signs involving the musculoskeletal system    Problem List Patient Active Problem List   Diagnosis Date Noted   Chronic low back pain without sciatica 07/29/2021   Chronic left shoulder pain 07/29/2021   Stage 3b chronic kidney disease ([place]) 07/29/2021   Tachycardia 03/10/2021   Constipation 12/17/2020   Early satiety 12/17/2020   Abdominal pain 12/17/2020   Anemia 12/17/2020   BPH (benign prostatic hyperplasia) 10/21/2020   Prediabetes 10/21/2020   Recurrent deep vein thrombosis (DVT) ([place]) 03/13/2019   HLD (hyperlipidemia) 11/01/2006   Anxiety 11/01/2006   Essential hypertension 11/01/2006   Allergic rhinitis 11/01/2006   GERD 11/01/2006   OSTEOARTHRITIS 11/01/2006    Guadelupe Sabin, OTR/L  [phone] 11/11/2021, 1:42 PM  [place], [place], [address] Phone: [phone]   Fax:  [phone]  Name: Bruce Adkins MRN: [id number] Date of Birth: 1956-07-23

## 2021-11-12 ENCOUNTER — Encounter (HOSPITAL_COMMUNITY): Payer: 59 | Admitting: Occupational Therapy

## 2021-11-17 ENCOUNTER — Ambulatory Visit (HOSPITAL_COMMUNITY): Payer: 59 | Admitting: Occupational Therapy

## 2021-11-17 ENCOUNTER — Telehealth (HOSPITAL_COMMUNITY): Payer: Self-pay | Admitting: Occupational Therapy

## 2021-11-17 NOTE — Telephone Encounter (Signed)
Called pt regarding no show for OT appt today. Pt did not realize he had 2 appts this week, thought he only had a Friday appt. Reminded pt of Friday's appt at 10:30 and asked to call if unable to attend.    Ezra Sites, OTR/L  [phone] 11/17/21

## 2021-11-19 ENCOUNTER — Encounter (HOSPITAL_COMMUNITY): Payer: Self-pay | Admitting: Occupational Therapy

## 2021-11-19 ENCOUNTER — Other Ambulatory Visit: Payer: Self-pay

## 2021-11-19 ENCOUNTER — Ambulatory Visit (HOSPITAL_COMMUNITY): Payer: 59 | Admitting: Occupational Therapy

## 2021-11-19 DIAGNOSIS — R29898 Other symptoms and signs involving the musculoskeletal system: Secondary | ICD-10-CM | POA: Diagnosis not present

## 2021-11-19 DIAGNOSIS — M25512 Pain in left shoulder: Secondary | ICD-10-CM | POA: Diagnosis not present

## 2021-11-19 DIAGNOSIS — G8929 Other chronic pain: Secondary | ICD-10-CM

## 2021-11-19 DIAGNOSIS — M25612 Stiffness of left shoulder, not elsewhere classified: Secondary | ICD-10-CM

## 2021-11-19 NOTE — Patient Instructions (Addendum)
Complete the following exercises 2-3 times a day.  Doorway Stretch  Place each hand opposite each other on the doorway. (You can change where you feel the stretch by moving arms higher or lower.) Step through with one foot and bend front knee until a stretch is felt and hold. Step through with the opposite foot on the next rep. Hold for __10-15___ seconds. Repeat __2 to 3__times.     http://orth.exer.us/944   Copyright  VHI. All rights reserved.   Internal Rotation Across Back  Grab the end of a towel with your affected side, palm facing backwards. Grab the towel with your unaffected side and pull your affected hand across your back until you feel a stretch in the front of your shoulder. If you feel pain, pull just to the pain, do not pull through the pain. Hold. Return your affected arm to your side. Try to keep your hand/arm close to your body during the entire movement.     Hold for 10-15 seconds. Complete 2 to 3 times.     Same for external rotation but grasp towel with L arm overhead and pull down towards the ground.      Posterior Capsule Stretch   Stand or sit, one arm across body so hand rests over opposite shoulder. Gently push on crossed elbow with other hand until stretch is felt in shoulder of crossed arm. Hold _10-15__ seconds.  Repeat _2 to 3__ times per session. Do ___ sessions per day.   Wall Flexion  Slide your arm up the wall or door frame until a stretch is felt in your shoulder . Hold for 10-15 seconds. Complete 2 times     Shoulder Abduction Stretch  Stand side ways by a wall with affected up on wall. Gently step in toward wall to feel stretch. Hold for 10-15 seconds. Complete 2 times.

## 2021-11-19 NOTE — Therapy (Signed)
[facility] [address] [place], [place], [address] Phone: [phone]   Fax:  [phone]  Occupational Therapy Treatment  Patient Details  Name: Bruce Adkins MRN: [id number] Date of Birth: 11-04-1955 Referring Provider (OT): Dr. Arther Abbott   Encounter Date: 11/19/2021   OT End of Session - 11/19/21 1104     Visit Number 3    Number of Visits 16    Date for OT Re-Evaluation 01/02/22   mini-reassessment 12/02/2021   Authorization Type [name], $20 copay    Authorization Time Period 25 visit limit, auth needed after 25th visit    Authorization - Visit Number 3    Authorization - Number of Visits 25    OT Start Time 1028    OT Stop Time 1109    OT Time Calculation (min) 41 min    Activity Tolerance Patient tolerated treatment well    Behavior During Therapy [facility] for tasks assessed/performed             Past Medical History:  Diagnosis Date   Arthritis    BPH with obstruction/lower urinary tract symptoms    CKD (chronic kidney disease)    DVT (deep venous thrombosis) ([place]) 2016   LLE   GERD (gastroesophageal reflux disease)    Hyperlipidemia    Hypertension    Panic attacks     Past Surgical History:  Procedure Laterality Date   BALLOON DILATION  12/30/2020   Procedure: BALLOON DILATION;  Surgeon: Harvel Quale, MD;  [facility];  Service: Gastroenterology;;   BIOPSY  12/30/2020   Procedure: BIOPSY;  Surgeon: Harvel Quale, MD;  [facility];  Service: Gastroenterology;;   COLONOSCOPY     COLONOSCOPY WITH PROPOFOL N/A 12/30/2020   Procedure: COLONOSCOPY WITH PROPOFOL;  Surgeon: Harvel Quale, MD;  [facility];  Service: Gastroenterology;  Laterality: N/A;   ESOPHAGOGASTRODUODENOSCOPY (EGD) WITH PROPOFOL N/A 12/30/2020   Procedure: ESOPHAGOGASTRODUODENOSCOPY (EGD) WITH PROPOFOL;  Surgeon: Harvel Quale, MD;  [facility];   Service: Gastroenterology;  Laterality: N/A;  am    There were no vitals filed for this visit.   Subjective Assessment - 11/19/21 1027     Subjective  S: I would say a 5 today. Biggest probelm is at night when i'm sleeping.    Currently in Pain? Yes    Pain Score 5     Pain Location Shoulder    Pain Orientation Left    Pain Descriptors / Indicators Aching;Sore    Pain Type Chronic pain    Pain Radiating Towards Down to elbow and up shoulder to neck.    Pain Onset More than a month ago    Pain Frequency Occasional    Aggravating Factors  movement    Pain Relieving Factors Sleeping on other shoulder.    Effect of Pain on Daily Activities min effect for tasks like puting on belt and related tasks.                [facility] OT Assessment - 11/19/21 0001       Assessment   Medical Diagnosis left adhesive capsulitis    Referring Provider (OT) Dr. Arther Abbott      Precautions   Precautions None                      OT Treatments/Exercises (OP) - 11/19/21 0001       Exercises  Exercises Shoulder      Shoulder Exercises: Supine   Protraction PROM;5 reps;AROM;10 reps    Horizontal ABduction PROM;5 reps;AROM;10 reps    External Rotation PROM;5 reps;AAROM;10 reps    Internal Rotation PROM;5 reps;AAROM;10 reps    Flexion PROM;5 reps;AAROM;10 reps    ABduction PROM;5 reps;AAROM;10 reps      Shoulder Exercises: Standing   Protraction AROM;10 reps    Horizontal ABduction AROM;10 reps    External Rotation AAROM;10 reps    Internal Rotation AAROM;10 reps    Flexion AAROM;10 reps    ABduction AAROM;10 reps    Extension 10 reps;Theraband    Theraband Level (Shoulder Extension) Level 2 (Red)    Row 10 reps;Theraband    Theraband Level (Shoulder Row) Level 2 (Red)    Retraction 10 reps;Theraband    Theraband Level (Shoulder Retraction) Level 2 (Red)      Shoulder Exercises: Stretch   Cross Chest Stretch 3 reps;10 seconds    Internal Rotation Stretch 3 reps    towel horizontally   External Rotation Stretch --   towel vertically   Wall Stretch - Flexion 3 reps;10 seconds    Wall Stretch - ABduction 3 reps;10 seconds      Manual Therapy   Manual Therapy Myofascial release    Manual therapy comments completed separately from therapeutic exercises    Myofascial Release myofascial release and manual techniques to left upper arm, trapezius, scapular regions to decrease pain and fascial restrictions and increase joint ROM                    OT Education - 11/19/21 1340     Education Details L shoulder stretches    Person(s) Educated Patient    Methods Explanation;Demonstration;Handout;Tactile cues;Verbal cues    Comprehension Verbalized understanding;Returned demonstration              OT Short Term Goals - 11/11/21 1300       OT SHORT TERM GOAL #1   Title Pt will be provided with and educated on HEP to improve mobility of LUE required for ADL completion.    Time 4    Period Weeks    Status On-going    Target Date 12/03/21      OT SHORT TERM GOAL #2   Title Pt will increase LUE P/ROM to [facility] to improve ability to perform dressing tasks with minimal compensatory strategies and no physical assistance.    Time 4    Period Weeks    Status On-going               OT Long Term Goals - 11/11/21 1300       OT LONG TERM GOAL #1   Title Pt will decrease pain in LUE to 3/10 or less to improve ability to sleep in position of comfort for 3+ hours without waking due to pain and needing to turn over.    Time 8    Period Weeks    Status On-going    Target Date 01/02/22      OT LONG TERM GOAL #2   Title Pt will decrease LUE fascial restrictions to trace amounts to improve mobility required for functional reaching tasks.    Time 8    Period Weeks    Status On-going      OT LONG TERM GOAL #3   Title Pt will increase LUE A/ROM to improve ability to reach overhead and behind back when reaching for objects or performing  dressing/bathing tasks.    Time 8    Period Weeks    Status On-going      OT LONG TERM GOAL #4   Title Pt will increase activity tolerance in LUE to improve ability to work overhead for more than 2 minutes when completing home chores/repairs.    Time 8    Period Weeks    Status On-going                   Plan - 11/19/21 1338     Clinical Impression Statement A: Pt reported he was busy and did not get much opportunity to complete HEP this week. Pt toelrated myofascial release well followe by P/ROM, A/ROM, and AA/ROM in supine. Pt able to engage in stretching and standing A/ROM and AA/ROM in standing with verbal and stactile cuing. Pt provided HEP for L UE stretches and was able to progress to scapular strengthening with red band today.    Body Structure / Function / Physical Skills ADL;Endurance;Muscle spasms;UE functional use;Fascial restriction;Pain;ROM;IADL    Plan P: Continue with shoulder stretches; continue scpaular theraband work. Ask how HEP compliance was this week.    OT Home Exercise Plan eval: AA/ROM exercises; L shoulder stretches    Consulted and Agree with Plan of Care Patient             Patient will benefit from skilled therapeutic intervention in order to improve the following deficits and impairments:   Body Structure / Function / Physical Skills: ADL, Endurance, Muscle spasms, UE functional use, Fascial restriction, Pain, ROM, IADL       Visit Diagnosis: Chronic left shoulder pain  Stiffness of left shoulder, not elsewhere classified  Other symptoms and signs involving the musculoskeletal system    Problem List Patient Active Problem List   Diagnosis Date Noted   Chronic low back pain without sciatica 07/29/2021   Chronic left shoulder pain 07/29/2021   Stage 3b chronic kidney disease ([place]) 07/29/2021   Tachycardia 03/10/2021   Constipation 12/17/2020   Early satiety 12/17/2020   Abdominal pain 12/17/2020   Anemia 12/17/2020   BPH  (benign prostatic hyperplasia) 10/21/2020   Prediabetes 10/21/2020   Recurrent deep vein thrombosis (DVT) ([place]) 03/13/2019   HLD (hyperlipidemia) 11/01/2006   Anxiety 11/01/2006   Essential hypertension 11/01/2006   Allergic rhinitis 11/01/2006   GERD 11/01/2006   OSTEOARTHRITIS 11/01/2006   Larey Seat OT, MOT  Larey Seat, OT 11/19/2021, 1:41 PM  [place], [place], [address] Phone: [phone]   Fax:  [phone]  Name: Bruce Adkins MRN: [id number] Date of Birth: 11-Jul-1956

## 2021-11-24 ENCOUNTER — Other Ambulatory Visit: Payer: Self-pay

## 2021-11-24 ENCOUNTER — Ambulatory Visit (HOSPITAL_COMMUNITY): Payer: 59 | Attending: Orthopedic Surgery | Admitting: Occupational Therapy

## 2021-11-24 ENCOUNTER — Encounter (HOSPITAL_COMMUNITY): Payer: Self-pay | Admitting: Occupational Therapy

## 2021-11-24 DIAGNOSIS — M25612 Stiffness of left shoulder, not elsewhere classified: Secondary | ICD-10-CM | POA: Diagnosis not present

## 2021-11-24 DIAGNOSIS — G8929 Other chronic pain: Secondary | ICD-10-CM | POA: Diagnosis not present

## 2021-11-24 DIAGNOSIS — R29898 Other symptoms and signs involving the musculoskeletal system: Secondary | ICD-10-CM | POA: Diagnosis not present

## 2021-11-24 DIAGNOSIS — M25512 Pain in left shoulder: Secondary | ICD-10-CM | POA: Insufficient documentation

## 2021-11-24 NOTE — Patient Instructions (Signed)

## 2021-11-24 NOTE — Therapy (Signed)
[facility] [address] [place], [place], [address] Phone: [phone]   Fax:  [phone]  Occupational Therapy Treatment  Patient Details  Name: Bruce Adkins MRN: [id number] Date of Birth: 1956/08/22 Referring Provider (OT): Dr. Arther Abbott   Encounter Date: 11/24/2021   OT End of Session - 11/24/21 1030     Visit Number 4    Number of Visits 16    Date for OT Re-Evaluation 01/02/22   mini-reassessment 12/02/2021   Authorization Type [name], $20 copay    Authorization Time Period 25 visit limit, auth needed after 25th visit    Authorization - Visit Number 4    Authorization - Number of Visits 25    OT Start Time [id number]    OT Stop Time [id number]    OT Time Calculation (min) 43 min    Activity Tolerance Patient tolerated treatment well    Behavior During Therapy [facility] for tasks assessed/performed             Past Medical History:  Diagnosis Date   Arthritis    BPH with obstruction/lower urinary tract symptoms    CKD (chronic kidney disease)    DVT (deep venous thrombosis) ([place]) 2016   LLE   GERD (gastroesophageal reflux disease)    Hyperlipidemia    Hypertension    Panic attacks     Past Surgical History:  Procedure Laterality Date   BALLOON DILATION  12/30/2020   Procedure: BALLOON DILATION;  Surgeon: Harvel Quale, MD;  [facility];  Service: Gastroenterology;;   BIOPSY  12/30/2020   Procedure: BIOPSY;  Surgeon: Harvel Quale, MD;  [facility];  Service: Gastroenterology;;   COLONOSCOPY     COLONOSCOPY WITH PROPOFOL N/A 12/30/2020   Procedure: COLONOSCOPY WITH PROPOFOL;  Surgeon: Harvel Quale, MD;  [facility];  Service: Gastroenterology;  Laterality: N/A;   ESOPHAGOGASTRODUODENOSCOPY (EGD) WITH PROPOFOL N/A 12/30/2020   Procedure: ESOPHAGOGASTRODUODENOSCOPY (EGD) WITH PROPOFOL;  Surgeon: Harvel Quale, MD;  [facility];   Service: Gastroenterology;  Laterality: N/A;  am    There were no vitals filed for this visit.   Subjective Assessment - 11/24/21 0944     Subjective  S: I've been working it a little but not a whole lot.    Currently in Pain? Yes    Pain Score 5     Pain Location Shoulder    Pain Orientation Left    Pain Descriptors / Indicators Aching;Sore    Pain Type Chronic pain    Pain Radiating Towards down arm at night    Pain Onset More than a month ago    Pain Frequency Intermittent    Aggravating Factors  movement, trying to sleep    Pain Relieving Factors tylenol    Effect of Pain on Daily Activities min effect on ADLs    Multiple Pain Sites No                [facility] OT Assessment - 11/24/21 0943       Assessment   Medical Diagnosis left adhesive capsulitis      Precautions   Precautions None                      OT Treatments/Exercises (OP) - 11/24/21 0947       Exercises   Exercises Shoulder      Shoulder Exercises: Supine   Protraction PROM;5 reps;AROM;12  reps    Horizontal ABduction PROM;5 reps;AROM;12 reps    External Rotation PROM;5 reps;AROM;12 reps    Internal Rotation PROM;5 reps;AROM;12 reps    Flexion PROM;5 reps;AROM;12 reps    ABduction PROM;5 reps;AROM;12 reps      Shoulder Exercises: Standing   Flexion AROM;10 reps    ABduction AROM;10 reps    Extension Theraband;12 reps    Theraband Level (Shoulder Extension) Level 3 (Green)    Row [employer] reps    Theraband Level (Shoulder Row) Level 3 (Green)    Retraction Theraband;12 reps    Theraband Level (Shoulder Retraction) Level 3 (Green)      Shoulder Exercises: Pulleys   Flexion 1 minute    ABduction 1 minute      Shoulder Exercises: ROM/Strengthening   X to V Arms 10X A/ROM      Shoulder Exercises: Stretch   Cross Chest Stretch 3 reps;20 seconds    Internal Rotation Stretch 3 reps   20" hold horizontal towel   Wall Stretch - Flexion 3 reps;20 seconds    Wall Stretch -  ABduction 3 reps;20 seconds      Manual Therapy   Manual Therapy Myofascial release    Manual therapy comments completed separately from therapeutic exercises    Myofascial Release myofascial release and manual techniques to left upper arm, trapezius, scapular regions to decrease pain and fascial restrictions and increase joint ROM                    OT Education - 11/24/21 1017     Education Details A/ROM exercises    Person(s) Educated Patient    Methods Explanation;Demonstration;Handout;Tactile cues;Verbal cues    Comprehension Verbalized understanding;Returned demonstration              OT Short Term Goals - 11/11/21 1300       OT SHORT TERM GOAL #1   Title Pt will be provided with and educated on HEP to improve mobility of LUE required for ADL completion.    Time 4    Period Weeks    Status On-going    Target Date 12/03/21      OT SHORT TERM GOAL #2   Title Pt will increase LUE P/ROM to [facility] to improve ability to perform dressing tasks with minimal compensatory strategies and no physical assistance.    Time 4    Period Weeks    Status On-going               OT Long Term Goals - 11/11/21 1300       OT LONG TERM GOAL #1   Title Pt will decrease pain in LUE to 3/10 or less to improve ability to sleep in position of comfort for 3+ hours without waking due to pain and needing to turn over.    Time 8    Period Weeks    Status On-going    Target Date 01/02/22      OT LONG TERM GOAL #2   Title Pt will decrease LUE fascial restrictions to trace amounts to improve mobility required for functional reaching tasks.    Time 8    Period Weeks    Status On-going      OT LONG TERM GOAL #3   Title Pt will increase LUE A/ROM to improve ability to reach overhead and behind back when reaching for objects or performing dressing/bathing tasks.    Time 8    Period Weeks    Status On-going  OT LONG TERM GOAL #4   Title Pt will increase activity tolerance  in LUE to improve ability to work overhead for more than 2 minutes when completing home chores/repairs.    Time 8    Period Weeks    Status On-going                   Plan - 11/24/21 1015     Clinical Impression Statement A: Pt reports he has been completing his HEP when he can work it in. Continued with myofascial release to address fascial restrictions, passive stretching and A/ROM also completed. Continued with shoulder stretches at wall, increasing holds to 20" each. Added x to v arms and completed flexion and abduction A/ROM in standing. Updated HEP for A/ROM and instructed pt to continue with shoulder stretches and increase to 20" holds. Verbal cuing for form and technique.    Body Structure / Function / Physical Skills ADL;Endurance;Muscle spasms;UE functional use;Fascial restriction;Pain;ROM;IADL    Plan P: Follow up on HEP, continue with A/ROM in standing and shoulder stretches, add ball on wall    OT Home Exercise Plan eval: AA/ROM exercises; L shoulder stretches; 3/1: A/ROM    Consulted and Agree with Plan of Care Patient             Patient will benefit from skilled therapeutic intervention in order to improve the following deficits and impairments:   Body Structure / Function / Physical Skills: ADL, Endurance, Muscle spasms, UE functional use, Fascial restriction, Pain, ROM, IADL       Visit Diagnosis: Chronic left shoulder pain  Stiffness of left shoulder, not elsewhere classified  Other symptoms and signs involving the musculoskeletal system    Problem List Patient Active Problem List   Diagnosis Date Noted   Chronic low back pain without sciatica 07/29/2021   Chronic left shoulder pain 07/29/2021   Stage 3b chronic kidney disease ([place]) 07/29/2021   Tachycardia 03/10/2021   Constipation 12/17/2020   Early satiety 12/17/2020   Abdominal pain 12/17/2020   Anemia 12/17/2020   BPH (benign prostatic hyperplasia) 10/21/2020   Prediabetes 10/21/2020    Recurrent deep vein thrombosis (DVT) ([place]) 03/13/2019   HLD (hyperlipidemia) 11/01/2006   Anxiety 11/01/2006   Essential hypertension 11/01/2006   Allergic rhinitis 11/01/2006   GERD 11/01/2006   OSTEOARTHRITIS 11/01/2006    Guadelupe Sabin, OTR/L  [phone] 11/24/2021, 10:30 AM  [facility], [place], [address] Phone: [phone]   Fax:  [phone]  Name: Bruce Adkins MRN: [id number] Date of Birth: 04-16-1956

## 2021-11-25 ENCOUNTER — Other Ambulatory Visit (HOSPITAL_COMMUNITY): Payer: Self-pay

## 2021-11-25 DIAGNOSIS — R339 Retention of urine, unspecified: Secondary | ICD-10-CM | POA: Diagnosis not present

## 2021-11-26 ENCOUNTER — Ambulatory Visit (HOSPITAL_COMMUNITY): Payer: 59

## 2021-11-30 ENCOUNTER — Encounter (HOSPITAL_COMMUNITY): Payer: Self-pay | Admitting: Occupational Therapy

## 2021-11-30 ENCOUNTER — Other Ambulatory Visit: Payer: Self-pay

## 2021-11-30 ENCOUNTER — Ambulatory Visit (HOSPITAL_COMMUNITY): Payer: 59 | Admitting: Occupational Therapy

## 2021-11-30 DIAGNOSIS — G8929 Other chronic pain: Secondary | ICD-10-CM | POA: Diagnosis not present

## 2021-11-30 DIAGNOSIS — M25512 Pain in left shoulder: Secondary | ICD-10-CM | POA: Diagnosis not present

## 2021-11-30 DIAGNOSIS — N1832 Chronic kidney disease, stage 3b: Secondary | ICD-10-CM | POA: Diagnosis not present

## 2021-11-30 DIAGNOSIS — M25612 Stiffness of left shoulder, not elsewhere classified: Secondary | ICD-10-CM

## 2021-11-30 DIAGNOSIS — R29898 Other symptoms and signs involving the musculoskeletal system: Secondary | ICD-10-CM | POA: Diagnosis not present

## 2021-11-30 DIAGNOSIS — D638 Anemia in other chronic diseases classified elsewhere: Secondary | ICD-10-CM | POA: Diagnosis not present

## 2021-11-30 DIAGNOSIS — R809 Proteinuria, unspecified: Secondary | ICD-10-CM | POA: Diagnosis not present

## 2021-11-30 DIAGNOSIS — E211 Secondary hyperparathyroidism, not elsewhere classified: Secondary | ICD-10-CM | POA: Diagnosis not present

## 2021-11-30 NOTE — Therapy (Signed)
[facility] [address] [place], [place], [address] Phone: [phone]   Fax:  [phone]  Occupational Therapy Treatment  Patient Details  Name: Bruce Adkins MRN: [id number] Date of Birth: [birth year] Referring Provider (OT): Dr. Arther Abbott   Encounter Date: 11/30/2021   OT End of Session - 11/30/21 1134     Visit Number 5    Number of Visits 16    Date for OT Re-Evaluation 01/02/22   mini-reassessment 12/02/2021   Authorization Type [name], $20 copay    Authorization Time Period 25 visit limit, auth needed after 25th visit    Authorization - Visit Number 5    Authorization - Number of Visits 25    OT Start Time [phone]    OT Stop Time 1029    OT Time Calculation (min) 42 min    Activity Tolerance Patient tolerated treatment well    Behavior During Therapy [facility] for tasks assessed/performed             Past Medical History:  Diagnosis Date   Arthritis    BPH with obstruction/lower urinary tract symptoms    CKD (chronic kidney disease)    DVT (deep venous thrombosis) ([place]) 2016   LLE   GERD (gastroesophageal reflux disease)    Hyperlipidemia    Hypertension    Panic attacks     Past Surgical History:  Procedure Laterality Date   BALLOON DILATION  12/30/2020   Procedure: BALLOON DILATION;  Surgeon: Harvel Quale, MD;  [facility];  Service: Gastroenterology;;   BIOPSY  12/30/2020   Procedure: BIOPSY;  Surgeon: Harvel Quale, MD;  [facility];  Service: Gastroenterology;;   COLONOSCOPY     COLONOSCOPY WITH PROPOFOL N/A 12/30/2020   Procedure: COLONOSCOPY WITH PROPOFOL;  Surgeon: Harvel Quale, MD;  [facility];  Service: Gastroenterology;  Laterality: N/A;   ESOPHAGOGASTRODUODENOSCOPY (EGD) WITH PROPOFOL N/A 12/30/2020   Procedure: ESOPHAGOGASTRODUODENOSCOPY (EGD) WITH PROPOFOL;  Surgeon: Harvel Quale, MD;  [facility];   Service: Gastroenterology;  Laterality: N/A;  am    There were no vitals filed for this visit.   Subjective Assessment - 11/30/21 0946     Subjective  S: I'm not getting rest at all at night.    Currently in Pain? Yes    Pain Score 5     Pain Location Shoulder    Pain Orientation Left    Pain Descriptors / Indicators Aching;Sore    Pain Type Chronic pain    Pain Radiating Towards down the arm    Pain Onset More than a month ago    Pain Frequency Intermittent    Aggravating Factors  movement, trying to sleep    Pain Relieving Factors tylenol    Effect of Pain on Daily Activities min effect on ADLs    Multiple Pain Sites No                [facility] OT Assessment - 11/30/21 0946       Assessment   Medical Diagnosis left adhesive capsulitis      Precautions   Precautions None                      OT Treatments/Exercises (OP) - 11/30/21 0950       Exercises   Exercises Shoulder      Shoulder Exercises: Supine   Protraction PROM;5 reps    Horizontal  ABduction PROM;5 reps    External Rotation PROM;5 reps    Internal Rotation PROM;5 reps    Flexion PROM;5 reps    ABduction PROM;5 reps      Shoulder Exercises: Standing   Protraction AROM;12 reps    Horizontal ABduction AROM;12 reps    External Rotation AROM;12 reps    Internal Rotation AROM;12 reps    Flexion AROM;12 reps    ABduction AROM;12 reps    Extension Theraband;12 reps    Theraband Level (Shoulder Extension) Level 3 (Green)    Retraction Theraband;12 reps    Theraband Level (Shoulder Retraction) Level 3 (Green)    Other Standing Exercises Y lift off, 10X      Shoulder Exercises: Pulleys   Flexion 1 minute      Shoulder Exercises: ROM/Strengthening   Over Head Lace 2' seated lacing to bottom then unlacing to top    X to V Arms 10X A/ROM    Proximal Shoulder Strengthening, Seated 10X each, no rest breaks    Ball on Wall ball on wall, 1' flexion 1' abduction green ball    Other  ROM/Strengthening Exercises ball pass behind back for IR, 10X      Shoulder Exercises: Stretch   Cross Chest Stretch --    Internal Rotation Stretch 3 reps   20" holds horizontal towel   Wall Stretch - Flexion 3 reps;20 seconds    Wall Stretch - ABduction 3 reps;20 seconds      Manual Therapy   Manual Therapy Myofascial release    Manual therapy comments completed separately from therapeutic exercises    Myofascial Release myofascial release and manual techniques to left upper arm, trapezius, scapular regions to decrease pain and fascial restrictions and increase joint ROM                      OT Short Term Goals - 11/11/21 1300       OT SHORT TERM GOAL #1   Title Pt will be provided with and educated on HEP to improve mobility of LUE required for ADL completion.    Time 4    Period Weeks    Status On-going    Target Date 12/03/21      OT SHORT TERM GOAL #2   Title Pt will increase LUE P/ROM to [facility] to improve ability to perform dressing tasks with minimal compensatory strategies and no physical assistance.    Time 4    Period Weeks    Status On-going               OT Long Term Goals - 11/11/21 1300       OT LONG TERM GOAL #1   Title Pt will decrease pain in LUE to 3/10 or less to improve ability to sleep in position of comfort for 3+ hours without waking due to pain and needing to turn over.    Time 8    Period Weeks    Status On-going    Target Date 01/02/22      OT LONG TERM GOAL #2   Title Pt will decrease LUE fascial restrictions to trace amounts to improve mobility required for functional reaching tasks.    Time 8    Period Weeks    Status On-going      OT LONG TERM GOAL #3   Title Pt will increase LUE A/ROM to improve ability to reach overhead and behind back when reaching for objects or performing dressing/bathing tasks.  Time 8    Period Weeks    Status On-going      OT LONG TERM GOAL #4   Title Pt will increase activity tolerance in  LUE to improve ability to work overhead for more than 2 minutes when completing home chores/repairs.    Time 8    Period Weeks    Status On-going                   Plan - 11/30/21 1013     Clinical Impression Statement A: Pt reports he has not been completing HEP due to time constraints. Continued with myofascial release to address fascial restrictions, passive stretching completed, A/ROM in standing and shoulder stretches continued today. Added ball pass and ball on wall. Verbal cuing for form and technique. Reminded pt of importance of completing HEP, suggested trying to complete stretches and use a heating pad before bed to improve ability to sleep.    Body Structure / Function / Physical Skills ADL;Endurance;Muscle spasms;UE functional use;Fascial restriction;Pain;ROM;IADL    Plan P: follow up on HEP completion, mini-reassessment    OT Home Exercise Plan eval: AA/ROM exercises; L shoulder stretches; 3/1: A/ROM    Consulted and Agree with Plan of Care Patient             Patient will benefit from skilled therapeutic intervention in order to improve the following deficits and impairments:   Body Structure / Function / Physical Skills: ADL, Endurance, Muscle spasms, UE functional use, Fascial restriction, Pain, ROM, IADL       Visit Diagnosis: Chronic left shoulder pain  Stiffness of left shoulder, not elsewhere classified  Other symptoms and signs involving the musculoskeletal system    Problem List Patient Active Problem List   Diagnosis Date Noted   Chronic low back pain without sciatica 07/29/2021   Chronic left shoulder pain 07/29/2021   Stage 3b chronic kidney disease ([place]) 07/29/2021   Tachycardia 03/10/2021   Constipation 12/17/2020   Early satiety 12/17/2020   Abdominal pain 12/17/2020   Anemia 12/17/2020   BPH (benign prostatic hyperplasia) 10/21/2020   Prediabetes 10/21/2020   Recurrent deep vein thrombosis (DVT) ([place]) 03/13/2019   HLD  (hyperlipidemia) 11/01/2006   Anxiety 11/01/2006   Essential hypertension 11/01/2006   Allergic rhinitis 11/01/2006   GERD 11/01/2006   OSTEOARTHRITIS 11/01/2006    Guadelupe Sabin, OTR/L  [phone] 11/30/2021, 11:35 AM  [place], [place], [address] Phone: [phone]   Fax:  [phone]  Name: Bruce Adkins MRN: [id number] Date of Birth: [birth year]

## 2021-12-02 ENCOUNTER — Other Ambulatory Visit (HOSPITAL_COMMUNITY): Payer: Self-pay

## 2021-12-02 ENCOUNTER — Encounter (HOSPITAL_COMMUNITY): Payer: 59

## 2021-12-02 DIAGNOSIS — N1832 Chronic kidney disease, stage 3b: Secondary | ICD-10-CM | POA: Diagnosis not present

## 2021-12-02 DIAGNOSIS — R809 Proteinuria, unspecified: Secondary | ICD-10-CM | POA: Diagnosis not present

## 2021-12-02 DIAGNOSIS — I129 Hypertensive chronic kidney disease with stage 1 through stage 4 chronic kidney disease, or unspecified chronic kidney disease: Secondary | ICD-10-CM | POA: Diagnosis not present

## 2021-12-02 MED ORDER — LISINOPRIL 5 MG PO TABS
5.0000 mg | ORAL_TABLET | Freq: Every day | ORAL | 3 refills | Status: DC
  Filled 2021-12-02: qty 90, 90d supply, fill #0

## 2021-12-04 DIAGNOSIS — H524 Presbyopia: Secondary | ICD-10-CM | POA: Diagnosis not present

## 2021-12-07 ENCOUNTER — Encounter (HOSPITAL_COMMUNITY): Payer: Self-pay | Admitting: Occupational Therapy

## 2021-12-07 ENCOUNTER — Ambulatory Visit (HOSPITAL_COMMUNITY): Payer: 59 | Attending: Hematology | Admitting: Occupational Therapy

## 2021-12-07 ENCOUNTER — Other Ambulatory Visit: Payer: Self-pay

## 2021-12-07 DIAGNOSIS — G8929 Other chronic pain: Secondary | ICD-10-CM | POA: Insufficient documentation

## 2021-12-07 DIAGNOSIS — R29898 Other symptoms and signs involving the musculoskeletal system: Secondary | ICD-10-CM | POA: Insufficient documentation

## 2021-12-07 DIAGNOSIS — M25512 Pain in left shoulder: Secondary | ICD-10-CM | POA: Diagnosis not present

## 2021-12-07 DIAGNOSIS — M25612 Stiffness of left shoulder, not elsewhere classified: Secondary | ICD-10-CM | POA: Insufficient documentation

## 2021-12-07 NOTE — Therapy (Signed)
?OUTPATIENT OCCUPATIONAL THERAPY TREATMENT NOTE ? ? ?Patient Name: Bruce Adkins ?MRN: [id number] ?DOB:09-16-1956, 66 y.o., male ?Today's Date: 12/07/2021 ? ?PCP: Lindell Spar, MD ?REFERRING PROVIDER: Dr. Arther Abbott ? ? ? ? ? 12/07/21 0950  ?Palpation  ?Palpation comment min/mod fascial restrictions along left upper arm, trapezius, and anterior shoulder  ?AROM  ?Overall AROM Comments Assessed seated, er/IR adducted  ?AROM Assessment Site Shoulder  ?Right/Left Shoulder Left  ?Left Shoulder Flexion 132 Degrees ?(112 previous)  ?Left Shoulder ABduction 140 Degrees ?(72 previous)  ?Left Shoulder Internal Rotation 90 Degrees ?(same as previous)  ?Left Shoulder External Rotation 40 Degrees ?(38 previous)  ?PROM  ?Left Shoulder Flexion 135 Degrees ?(133 previous)  ?Left Shoulder ABduction 131 Degrees ?(125 previous)  ?Left Shoulder Internal Rotation 90 Degrees ?(same as previous 90)  ?Left Shoulder External Rotation 45 Degrees ?(same as previous)  ?Overall PROM Comments Assessed supine, er/IR adducted  ?PROM Assessment Site Shoulder  ?Right/Left Shoulder Left  ?Strength  ?Overall Strength Comments Assessed seated, er/IR adducted  ?Strength Assessment Site Shoulder  ?Right/Left Shoulder Left  ? ? OT End of Session - 12/07/21 1028   ? ? Visit Number 6   ? Number of Visits 16   ? Date for OT Re-Evaluation 01/02/22   mini-reassessment 12/02/2021  ? Authorization Type [name] [place], [place] copay   ? Authorization Time Period 25 visit limit, auth needed after 25th visit   ? Authorization - Visit Number 6   ? Authorization - Number of Visits 25   ? OT Start Time [phone]   ? OT Stop Time 1026   ? OT Time Calculation (min) 38 min   ? Activity Tolerance Patient tolerated treatment well   ? Behavior During Therapy [facility] for tasks assessed/performed   ? ?  ?  ? ?  ? ? ?Past Medical History:  ?Diagnosis Date  ? Arthritis   ? BPH with obstruction/lower urinary tract symptoms   ? CKD (chronic kidney disease)   ? DVT (deep venous  thrombosis) ([place]) 2016  ? LLE  ? GERD (gastroesophageal reflux disease)   ? Hyperlipidemia   ? Hypertension   ? Panic attacks   ? ?Past Surgical History:  ?Procedure Laterality Date  ? BALLOON DILATION  12/30/2020  ? Procedure: BALLOON DILATION;  Surgeon: Montez Morita, Quillian Quince, MD;  [facility];  Service: Gastroenterology;;  ? BIOPSY  12/30/2020  ? Procedure: BIOPSY;  Surgeon: Harvel Quale, MD;  [facility];  Service: Gastroenterology;;  ? COLONOSCOPY    ? COLONOSCOPY WITH PROPOFOL N/A 12/30/2020  ? Procedure: COLONOSCOPY WITH PROPOFOL;  Surgeon: Harvel Quale, MD;  [facility];  Service: Gastroenterology;  Laterality: N/A;  ? ESOPHAGOGASTRODUODENOSCOPY (EGD) WITH PROPOFOL N/A 12/30/2020  ? Procedure: ESOPHAGOGASTRODUODENOSCOPY (EGD) WITH PROPOFOL;  Surgeon: Harvel Quale, MD;  [facility];  Service: Gastroenterology;  Laterality: N/A;  am  ? ?Patient Active Problem List  ? Diagnosis Date Noted  ? Chronic low back pain without sciatica 07/29/2021  ? Chronic left shoulder pain 07/29/2021  ? Stage 3b chronic kidney disease ([place]) 07/29/2021  ? Tachycardia 03/10/2021  ? Constipation 12/17/2020  ? Early satiety 12/17/2020  ? Abdominal pain 12/17/2020  ? Anemia 12/17/2020  ? BPH (benign prostatic hyperplasia) 10/21/2020  ? Prediabetes 10/21/2020  ? Recurrent deep vein thrombosis (DVT) ([place]) 03/13/2019  ? HLD (hyperlipidemia) 11/01/2006  ? Anxiety 11/01/2006  ? Essential hypertension 11/01/2006  ? Allergic rhinitis 11/01/2006  ? GERD 11/01/2006  ?  OSTEOARTHRITIS 11/01/2006  ? ? ?ONSET DATE: 02/24/2021 ? ?REFERRING DIAG: left adhesive capsulitis ? ?THERAPY DIAG:  ?Chronic left shoulder pain ? ?Stiffness of left shoulder, not elsewhere classified ? ?Other symptoms and signs involving the musculoskeletal system ? ? ?PERTINENT HISTORY: Pt is a 66 y/o male presenting with left adhesive capsulitis that began mid-2022 with no known cause. Pt had x-rays  which were normal, has not had additional imaging.  ? ?PRECAUTIONS: None ? ?SUBJECTIVE: S: I have good days and bad days.  ? ?PAIN:  ?Are you having pain? Yes: NPRS scale: 5/10 ?Pain location: left shoulder ?Pain description: aching ?Aggravating factors: weather ?Relieving factors: heat, blue emu gel ? ? ? ? ?OBJECTIVE:  ? ?TODAY'S TREATMENT:  ?-Myofascial release: completed separately from therapeutic exercises. myofascial release and manual techniques to left upper arm, trapezius, scapular regions to decrease pain and fascial restrictions and increase joint ROM  ?-P/ROM: shoulder flexion, abduction, er/IR, horizontal abduction, 5X each ?-A/ROM: shoulder flexion, abduction, er/IR, horizontal abduction, 12X each  ?-Ball pass behind back for IR, 10X using tennis ball  ?-Green scapular theraband: row, extension, retraction, 10X each ?-Ball on wall: 1' flexion, 1' abduction, red ball ?-Pulleys: 1' flexion, 1' abduction ? ? ?PATIENT EDUCATION: ?Education details: Educated pt on importance of completing HEP to progress functioning of LUE. Pt confirmed he has stretches and A/ROM handouts at home. Encouraged to try to complete at least 1x/day ?Person educated: Patient ?Education method: Explanation ?Education comprehension: verbalized understanding ? ? ? ? ? OT Short Term Goals - 12/07/21 1002   ? ?  ? OT SHORT TERM GOAL #1  ? Title Pt will be provided with and educated on HEP to improve mobility of LUE required for ADL completion.   ? Time 4   ? Period Weeks   ? Status On-going   ? Target Date 12/03/21   ?  ? OT SHORT TERM GOAL #2  ? Title Pt will increase LUE P/ROM to [facility] to improve ability to perform dressing tasks with minimal compensatory strategies and no physical assistance.   ? Time 4   ? Period Weeks   ? Status On-going   ? ?  ?  ? ?  ? ? ? OT Long Term Goals - 12/07/21 1002   ? ?  ? OT LONG TERM GOAL #1  ? Title Pt will decrease pain in LUE to 3/10 or less to improve ability to sleep in position of comfort for 3+  hours without waking due to pain and needing to turn over.   ? Time 8   ? Period Weeks   ? Status Achieved   ? Target Date 01/02/22   ?  ? OT LONG TERM GOAL #2  ? Title Pt will decrease LUE fascial restrictions to trace amounts to improve mobility required for functional reaching tasks.   ? Time 8   ? Period Weeks   ? Status On-going   ?  ? OT LONG TERM GOAL #3  ? Title Pt will increase LUE A/ROM to improve ability to reach overhead and behind back when reaching for objects or performing dressing/bathing tasks.   ? Time 8   ? Period Weeks   ? Status Partially Met   ?  ? OT LONG TERM GOAL #4  ? Title Pt will increase activity tolerance in LUE to improve ability to work overhead for more than 2 minutes when completing home chores/repairs.   ? Time 8   ? Period Weeks   ?  Status On-going   ? ?  ?  ? ?  ? ? ? Plan - 12/07/21 1009   ? ? Clinical Impression Statement A: Mini-reassessment completed this session, pt reports he has been inconsistent in HEP completion, notes overhead reaching has improved but reaching behind the back is still very difficult. Pt with minimal progress with P/ROM, does demonstrate improvements in A/ROM today. Educated pt on importance of HEP completion to see more progress in functioning of LUE, pt verbalized understanding. Completed myofascial release to address fascial restrictions, P/ROM, and A/ROM in standing. Pt completing scapular theraband and pulleys. Verbal cuing for form and technique.   ? Body Structure / Function / Physical Skills ADL;Endurance;Muscle spasms;UE functional use;Fascial restriction;Pain;ROM;IADL   ? Plan P: Follow up on HEP completion, add overhead lacing and functional reaching   ? OT Home Exercise Plan eval: AA/ROM exercises; L shoulder stretches; 3/1: A/ROM   ? Consulted and Agree with Plan of Care Patient   ? ?  ?  ? ?  ? ? ? ?Guadelupe Sabin, OTR/L  ?[phone] ?12/07/2021, 10:29 AM ? ?  ? ? ? ?

## 2021-12-09 ENCOUNTER — Ambulatory Visit: Payer: 59 | Admitting: Orthopedic Surgery

## 2021-12-09 ENCOUNTER — Encounter (HOSPITAL_COMMUNITY): Payer: 59

## 2021-12-09 ENCOUNTER — Encounter: Payer: Self-pay | Admitting: Orthopedic Surgery

## 2021-12-09 ENCOUNTER — Other Ambulatory Visit: Payer: Self-pay

## 2021-12-09 DIAGNOSIS — M7502 Adhesive capsulitis of left shoulder: Secondary | ICD-10-CM | POA: Diagnosis not present

## 2021-12-09 NOTE — Progress Notes (Signed)
Chief Complaint  ?Patient presents with  ? Shoulder Pain  ?  Follow up ?Pt states about 25% improved  ? ? ?66 year old male diagnosed with adhesive capsulitis went to physical therapy started on prednisone and tramadol is made significant improvements in this physical therapy ? ?His notes indicate now that he is up to 130 degrees of flexion ? ?His pain is 0-3 out of 10 ? ?His exam shows that his shoulder is on freezing ? ?Recommend continued therapy follow-up in 6 weeks ?

## 2021-12-13 ENCOUNTER — Other Ambulatory Visit (HOSPITAL_COMMUNITY): Payer: Self-pay

## 2021-12-13 ENCOUNTER — Other Ambulatory Visit: Payer: Self-pay | Admitting: Urology

## 2021-12-13 DIAGNOSIS — N138 Other obstructive and reflux uropathy: Secondary | ICD-10-CM

## 2021-12-13 MED ORDER — BETHANECHOL CHLORIDE 25 MG PO TABS
25.0000 mg | ORAL_TABLET | Freq: Three times a day (TID) | ORAL | 2 refills | Status: DC
  Filled 2021-12-13: qty 80, 27d supply, fill #0
  Filled 2021-12-13: qty 10, 3d supply, fill #0
  Filled 2022-01-24: qty 90, 30d supply, fill #1
  Filled 2022-03-03: qty 90, 30d supply, fill #2

## 2021-12-14 ENCOUNTER — Ambulatory Visit (HOSPITAL_COMMUNITY): Payer: 59 | Admitting: Occupational Therapy

## 2021-12-14 ENCOUNTER — Encounter (HOSPITAL_COMMUNITY): Payer: Self-pay | Admitting: Occupational Therapy

## 2021-12-14 ENCOUNTER — Other Ambulatory Visit: Payer: Self-pay

## 2021-12-14 DIAGNOSIS — R29898 Other symptoms and signs involving the musculoskeletal system: Secondary | ICD-10-CM

## 2021-12-14 DIAGNOSIS — M25512 Pain in left shoulder: Secondary | ICD-10-CM | POA: Diagnosis not present

## 2021-12-14 DIAGNOSIS — M25612 Stiffness of left shoulder, not elsewhere classified: Secondary | ICD-10-CM

## 2021-12-14 DIAGNOSIS — G8929 Other chronic pain: Secondary | ICD-10-CM | POA: Diagnosis not present

## 2021-12-14 NOTE — Therapy (Signed)
?OUTPATIENT OCCUPATIONAL THERAPY TREATMENT NOTE ? ? ?Patient Name: Bruce Adkins ?MRN: [id number] ?DOB:1955/10/15, 66 y.o., male ?Today's Date: 12/14/2021 ? ?PCP: Lindell Spar, MD ?REFERRING PROVIDER: Dr. Arther Abbott ? ? ? 12/14/21 1010  ?OT Visits / Re-Eval  ?Visit Number 7  ?Number of Visits 16  ?Date for OT Re-Evaluation 01/02/22  ?Authorization  ?Authorization Type [name] [place], [place] copay  ?Authorization Time Period 25 visit limit, auth needed after 25th visit  ?Authorization - Visit Number 7  ?Authorization - Number of Visits 25  ?OT Time Calculation  ?OT Start Time [phone]  ?OT Stop Time 1029  ?OT Time Calculation (min) 43 min  ?End of Session  ?Activity Tolerance Patient tolerated treatment well  ?Behavior During Therapy [facility] for tasks assessed/performed  ? ? ? ?Past Medical History:  ?Diagnosis Date  ? Arthritis   ? BPH with obstruction/lower urinary tract symptoms   ? CKD (chronic kidney disease)   ? DVT (deep venous thrombosis) ([place]) 2016  ? LLE  ? GERD (gastroesophageal reflux disease)   ? Hyperlipidemia   ? Hypertension   ? Panic attacks   ? ?Past Surgical History:  ?Procedure Laterality Date  ? BALLOON DILATION  12/30/2020  ? Procedure: BALLOON DILATION;  Surgeon: Montez Morita, Quillian Quince, MD;  [facility];  Service: Gastroenterology;;  ? BIOPSY  12/30/2020  ? Procedure: BIOPSY;  Surgeon: Harvel Quale, MD;  [facility];  Service: Gastroenterology;;  ? COLONOSCOPY    ? COLONOSCOPY WITH PROPOFOL N/A 12/30/2020  ? Procedure: COLONOSCOPY WITH PROPOFOL;  Surgeon: Harvel Quale, MD;  [facility];  Service: Gastroenterology;  Laterality: N/A;  ? ESOPHAGOGASTRODUODENOSCOPY (EGD) WITH PROPOFOL N/A 12/30/2020  ? Procedure: ESOPHAGOGASTRODUODENOSCOPY (EGD) WITH PROPOFOL;  Surgeon: Harvel Quale, MD;  [facility];  Service: Gastroenterology;  Laterality: N/A;  am  ? ?Patient Active Problem List  ? Diagnosis Date Noted  ? Chronic low  back pain without sciatica 07/29/2021  ? Chronic left shoulder pain 07/29/2021  ? Stage 3b chronic kidney disease ([place]) 07/29/2021  ? Tachycardia 03/10/2021  ? Constipation 12/17/2020  ? Early satiety 12/17/2020  ? Abdominal pain 12/17/2020  ? Anemia 12/17/2020  ? BPH (benign prostatic hyperplasia) 10/21/2020  ? Prediabetes 10/21/2020  ? Recurrent deep vein thrombosis (DVT) ([place]) 03/13/2019  ? HLD (hyperlipidemia) 11/01/2006  ? Anxiety 11/01/2006  ? Essential hypertension 11/01/2006  ? Allergic rhinitis 11/01/2006  ? GERD 11/01/2006  ? OSTEOARTHRITIS 11/01/2006  ? ? ?ONSET DATE: 02/24/2021 ? ?REFERRING DIAG: left adhesive capsulitis ? ?THERAPY DIAG:  ?Chronic left shoulder pain ? ?Stiffness of left shoulder, not elsewhere classified ? ?Other symptoms and signs involving the musculoskeletal system ? ? ?PERTINENT HISTORY: Pt is a 66 y/o male presenting with left adhesive capsulitis that began mid-2022 with no known cause. Pt had x-rays which were normal, has not had additional imaging.  ? ?PRECAUTIONS: None ? ?SUBJECTIVE: S: I got a little bit of my homework done.  ? ?PAIN:  ?Are you having pain? Yes: NPRS scale: 3/10 ?Pain location: left shoulder ?Pain description: aching, sore ?Aggravating factors: weather, doing a workout ?Relieving factors: heat, blue emu gel ? ? ? ? ?OBJECTIVE:  ? ?TODAY'S TREATMENT:  ?-Myofascial release: completed separately from therapeutic exercises. myofascial release and manual techniques to left upper arm, trapezius, scapular regions to decrease pain and fascial restrictions and increase joint ROM  ?-P/ROM: shoulder flexion, abduction, er/IR, horizontal abduction, 5X each ?-A/ROM: shoulder flexion, abduction, er/IR, horizontal abduction, 12X  each  ?-Green scapular theraband: row, extension, retraction, 10X each ?-X to V arms, 10X  ? ? ?12/07/21 ?-Myofascial release: completed separately from therapeutic exercises. myofascial release and manual techniques to left upper arm, trapezius, scapular  regions to decrease pain and fascial restrictions and increase joint ROM  ?-P/ROM: shoulder flexion, abduction, er/IR, horizontal abduction, 5X each ?-A/ROM: shoulder flexion, abduction, er/IR, horizontal abduction, 12X each  ?-Ball pass behind back for IR, 10X using tennis ball  ?-Green scapular theraband: row, extension, retraction, 10X each ?-Ball on wall: 1' flexion, 1' abduction, red ball ?-Pulleys: 1' flexion, 1' abduction ? ? ?PATIENT EDUCATION: ?Education details: green scapular theraband ?Person educated: Patient ?Education method: Explanation ?Education comprehension: verbalized understanding ? ? ? ? ? OT Short Term Goals   ? ?  ? OT SHORT TERM GOAL #1  ? Title Pt will be provided with and educated on HEP to improve mobility of LUE required for ADL completion.   ? Time 4   ? Period Weeks   ? Status On-going   ? Target Date 12/03/21   ?  ? OT SHORT TERM GOAL #2  ? Title Pt will increase LUE P/ROM to [facility] to improve ability to perform dressing tasks with minimal compensatory strategies and no physical assistance.   ? Time 4   ? Period Weeks   ? Status On-going   ? ?  ?  ? ?  ? ? ? OT Long Term Goals   ? ?  ? OT LONG TERM GOAL #1  ? Title Pt will decrease pain in LUE to 3/10 or less to improve ability to sleep in position of comfort for 3+ hours without waking due to pain and needing to turn over.   ? Time 8   ? Period Weeks   ? Status Achieved   ? Target Date 01/02/22   ?  ? OT LONG TERM GOAL #2  ? Title Pt will decrease LUE fascial restrictions to trace amounts to improve mobility required for functional reaching tasks.   ? Time 8   ? Period Weeks   ? Status On-going   ?  ? OT LONG TERM GOAL #3  ? Title Pt will increase LUE A/ROM to improve ability to reach overhead and behind back when reaching for objects or performing dressing/bathing tasks.   ? Time 8   ? Period Weeks   ? Status Partially Met   ?  ? OT LONG TERM GOAL #4  ? Title Pt will increase activity tolerance in LUE to improve ability to work  overhead for more than 2 minutes when completing home chores/repairs.   ? Time 8   ? Period Weeks   ? Status On-going   ? ?  ?  ? ?  ? ? ? Plan - 12/14/21 1009   ? ? Clinical Impression Statement A: Pt reports MD is pleased with his progress, goes back for a follow up in a few weeks. Pt has been trying to complete his HEP more regularly. Continued with myofascial release and manual techniques, P/ROM and A/ROM completed. Added overhead lacing and continued with scapular theraband, HEP updated for scapular theraband today. Verbal cuing for form and technique.   ? Body Structure / Function / Physical Skills ADL;Endurance;Muscle spasms;UE functional use;Fascial restriction;Pain;ROM;IADL   ? Plan P: Follow up on HEP, resume ball on wall, add overhead lacing   ? OT Home Exercise Plan eval: AA/ROM exercises; L shoulder stretches; 3/1: A/ROM; 3/21: green scapular theraband   ? ?  ?  ? ?  ? ? ? ? ?  Guadelupe Sabin, OTR/L  ?[phone] ?12/14/2021, 10:30 AM ? ?  ? ? ? ?

## 2021-12-14 NOTE — Patient Instructions (Signed)

## 2021-12-16 ENCOUNTER — Encounter (HOSPITAL_COMMUNITY): Payer: 59

## 2021-12-21 ENCOUNTER — Ambulatory Visit: Payer: 59 | Admitting: Urology

## 2021-12-21 ENCOUNTER — Other Ambulatory Visit: Payer: Self-pay

## 2021-12-21 ENCOUNTER — Encounter (HOSPITAL_COMMUNITY): Payer: 59

## 2021-12-21 VITALS — BP 139/87 | HR 73

## 2021-12-21 DIAGNOSIS — R339 Retention of urine, unspecified: Secondary | ICD-10-CM | POA: Diagnosis not present

## 2021-12-21 DIAGNOSIS — N289 Disorder of kidney and ureter, unspecified: Secondary | ICD-10-CM | POA: Diagnosis not present

## 2021-12-21 DIAGNOSIS — N138 Other obstructive and reflux uropathy: Secondary | ICD-10-CM | POA: Diagnosis not present

## 2021-12-21 DIAGNOSIS — N401 Enlarged prostate with lower urinary tract symptoms: Secondary | ICD-10-CM | POA: Diagnosis not present

## 2021-12-21 MED ORDER — CIPROFLOXACIN HCL 500 MG PO TABS
500.0000 mg | ORAL_TABLET | Freq: Once | ORAL | Status: AC
  Administered 2021-12-21: 500 mg via ORAL

## 2021-12-21 NOTE — Progress Notes (Signed)
History of Present Illness: Here for follow-up of history of BPH with retention and bilateral hydronephrosis. ? ?4.26.2022: Initial presentation w/ AUR w/ (B) hydro, resolved with catheter placement. Multiple voiding trial on med Rx unsuccessful. ?  ?9.26.2022: Urolift performed (TURP recommended). Prostate volume 47 mL. 7 implants placed. ?  ?12.6.2022: He is self catheterizing 3 times a day.  He does have spontaneous voids, usually early in the morning and later in the daytime.  In between, he does not void that much.  No problems with catheterizing, no blood in the urine, no dysuria.  Renal function is stable, followed by Dr. Wolfgang Phoenix.  Follow-up renal ultrasound revealed no residual hydronephrosis.  There was bladder wall thickening. ?  ?3.28.2023: Here for recheck.  He still is on self-catheterization on average about 5 times every 24 hours.  No problems with catheterization.  He has had no significantly cloudy urine or blood.  He has rare spontaneous voids but he does have occasional feelings of urgency. ? ? ?Past Medical History:  ?Diagnosis Date  ? Arthritis   ? BPH with obstruction/lower urinary tract symptoms   ? CKD (chronic kidney disease)   ? DVT (deep venous thrombosis) ([facility]) 2016  ? LLE  ? GERD (gastroesophageal reflux disease)   ? Hyperlipidemia   ? Hypertension   ? Panic attacks   ? ? ?Past Surgical History:  ?Procedure Laterality Date  ? BALLOON DILATION  12/30/2020  ? Procedure: BALLOON DILATION;  Surgeon: Marguerita Merles, Reuel Boom, MD;  [facility];  Service: Gastroenterology;;  ? BIOPSY  12/30/2020  ? Procedure: BIOPSY;  Surgeon: Dolores Frame, MD;  [facility];  Service: Gastroenterology;;  ? COLONOSCOPY    ? COLONOSCOPY WITH PROPOFOL N/A 12/30/2020  ? Procedure: COLONOSCOPY WITH PROPOFOL;  Surgeon: Dolores Frame, MD;  [facility];  Service: Gastroenterology;  Laterality: N/A;  ? ESOPHAGOGASTRODUODENOSCOPY (EGD) WITH PROPOFOL N/A 12/30/2020  ?  Procedure: ESOPHAGOGASTRODUODENOSCOPY (EGD) WITH PROPOFOL;  Surgeon: Dolores Frame, MD;  [facility];  Service: Gastroenterology;  Laterality: N/A;  am  ? ? ?Home Medications:  ?Allergies as of 12/21/2021   ?No Known Allergies ?  ? ?  ?Medication List  ?  ? ?  ? Accurate as of December 21, 2021  8:17 AM. If you have any questions, ask your nurse or doctor.  ?  ?  ? ?  ? ?acetaminophen 500 MG tablet ?Commonly known as: TYLENOL ?Take 1,000 mg by mouth every 6 (six) hours as needed for mild pain. ?  ?baclofen 10 MG tablet ?Commonly known as: LIORESAL ?Take 1 tablet (10 mg total) by mouth at bedtime as needed for muscle spasms. ?  ?bethanechol 25 MG tablet ?Commonly known as: Urecholine ?Take 1 tablet (25 mg total) by mouth 3 (three) times daily. ?  ?Eliquis 5 MG Tabs tablet ?Generic drug: apixaban ?Take 1 tablet (5 mg total) by mouth 2 (two) times daily. ?  ?famotidine 40 MG tablet ?Commonly known as: PEPCID ?TAKE 1 TABLET (40 MG TOTAL) BY MOUTH DAILY. ?What changed:  ?how much to take ?when to take this ?reasons to take this ?  ?lisinopril 5 MG tablet ?Commonly known as: ZESTRIL ?Take 1 tablet (5 mg total) by mouth daily. ?  ?predniSONE 10 MG (48) Tbpk tablet ?Commonly known as: STERAPRED UNI-PAK 48 TAB ?take as directed on package ?  ?sodium bicarbonate 650 MG tablet ?Take 1 tablet (650 mg total) by mouth in the morning, in the evening, and  at bedtime. ?  ?tamsulosin 0.4 MG Caps capsule ?Commonly known as: FLOMAX ?Take 1 capsule (0.4 mg total) by mouth daily. ?  ?traMADol 50 MG tablet ?Commonly known as: ULTRAM ?Take 1 tablet (50 mg total) by mouth every 6 (six) hours as needed. ?  ? ?  ? ? ?Allergies: No Known Allergies ? ?Family History  ?Problem Relation Age of Onset  ? Heart failure Mother   ? Hypertension Mother   ? Anxiety disorder Mother   ? Hypertension Father   ? Cancer Brother   ? ? ?Social History:  reports that he has never smoked. He has never used smokeless tobacco. He reports that  he does not currently use drugs. He reports that he does not drink alcohol. ? ?ROS: ?A complete review of systems was performed.  All systems are negative except for pertinent findings as noted. ? ?Physical Exam:  ?Vital signs in last 24 hours: ? ? ?I have reviewed prior pt notes ? ?I have reviewed urinalysis results ? ?I have independently reviewed prior imaging ? ?I have reviewed prior PSA results ? ?I have reviewed prior urine culture ? ?Cystoscopy Procedure Note: ? ?Indication: Follow-up of BPH/retention ? ?After informed consent and discussion of the procedure and its risks, VASHON RIORDAN was positioned and prepped in the standard fashion.  ?Cystoscopy was performed with a flexible cystoscope.   ?Findings: ?Urethra: No stricture or lesions ?Prostate: Still somewhat obstructing with bilobar hypertrophy ?Bladder neck: Normal ?Ureteral orifices: Normal bilaterally ?Bladder: No lesions median lobe ? ?The patient tolerated the procedure well. ? ? ? ? ?Impression/Assessment:  ?BPH with retention.  Status post UroLift last year with continued retention.  Question remains about underlying bladder dysfunction ? ?Hydronephrosis, resolved ? ?Plan:  ?I will arrange for the patient to have urodynamics at [facility] in [place] ? ?I will call with results and further follow-up. ? ?

## 2021-12-23 ENCOUNTER — Encounter (HOSPITAL_COMMUNITY): Payer: 59

## 2021-12-28 ENCOUNTER — Telehealth (HOSPITAL_COMMUNITY): Payer: Self-pay

## 2021-12-28 ENCOUNTER — Encounter (HOSPITAL_COMMUNITY): Payer: 59

## 2021-12-28 NOTE — Telephone Encounter (Signed)
Called and spoke to patient regarding recent no shows. This appointment on 12/28/21 and previous no show appointment should have been cancelled and were missed.  ? ?He had called on 12/20/21 and spoke to the front desk about not being able to come in on Tuesdays and wanting to drop to once a week. Message was not relayed to treating therapists.  ? ?Apologized for miscommunication and confirmed with patient his next appointment this week on Thursday which will be a reassessment. If needing to continue therapy, his frequency can definitely be dropped down to once a week if needed. Pt verbalized understanding.  ? ?Ailene Ravel, OTR/L,CBIS  ?[phone] ? ?

## 2021-12-30 ENCOUNTER — Encounter (HOSPITAL_COMMUNITY): Payer: Self-pay | Admitting: Occupational Therapy

## 2021-12-30 ENCOUNTER — Ambulatory Visit (HOSPITAL_COMMUNITY): Payer: 59 | Attending: Hematology | Admitting: Occupational Therapy

## 2021-12-30 DIAGNOSIS — G8929 Other chronic pain: Secondary | ICD-10-CM | POA: Insufficient documentation

## 2021-12-30 DIAGNOSIS — M25612 Stiffness of left shoulder, not elsewhere classified: Secondary | ICD-10-CM | POA: Diagnosis not present

## 2021-12-30 DIAGNOSIS — R29898 Other symptoms and signs involving the musculoskeletal system: Secondary | ICD-10-CM | POA: Insufficient documentation

## 2021-12-30 DIAGNOSIS — M25512 Pain in left shoulder: Secondary | ICD-10-CM | POA: Insufficient documentation

## 2021-12-30 NOTE — Patient Instructions (Signed)

## 2021-12-30 NOTE — Therapy (Addendum)
?OUTPATIENT OCCUPATIONAL THERAPY REASSESSMENT & TREATMENT NOTE ?DISCHARGE SUMMARY ? ? ?Patient Name: Bruce Adkins ?MRN: [id number] ?DOB:November 13, 1955, 66 y.o., male ?Today's Date: 12/30/2021 ? ?PCP: Lindell Spar, MD ?REFERRING PROVIDER: Dr. Arther Abbott ? ? ? ? 12/30/21 0904  ?Assessment  ?Medical Diagnosis left adhesive capsulitis  ?Precautions  ?Precautions None  ?Observation/Other Assessments  ?Focus on Therapeutic Outcomes (FOTO)  69/100 ?(same as previous)  ?AROM  ?Overall AROM Comments Assessed seated, er/IR adducted  ?AROM Assessment Site Shoulder  ?Right/Left Shoulder Left  ?Left Shoulder Flexion 135 Degrees ?(132 previous)  ?Left Shoulder ABduction 150 Degrees ?(140 previous)  ?Left Shoulder Internal Rotation 90 Degrees ?(same as previous)  ?Left Shoulder External Rotation 40 Degrees ?(same as previous)  ?PROM  ?Left Shoulder Flexion 137 Degrees ?(135 previous)  ?Left Shoulder ABduction 142 Degrees ?(131 previous)  ?Left Shoulder Internal Rotation 90 Degrees ?(same as previous)  ?Left Shoulder External Rotation 40 Degrees ?(45 previous)  ?Overall PROM Comments Assessed supine, er/IR adducted  ?PROM Assessment Site Shoulder  ?Right/Left Shoulder Left  ?Strength  ?Overall Strength Comments Assessed seated, er/IR adducted  ?Strength Assessment Site Shoulder  ?Right/Left Shoulder Left  ? ? ? ? ?OT Visits / Re-Eval  ?Visit Number 8  ?Number of Visits 16  ?Date for OT Re-Evaluation 01/02/22  ?Authorization  ?Authorization Type [name] [place], [place] copay  ?Authorization Time Period 25 visit limit, auth needed after 25th visit  ?Authorization - Visit Number 8  ?Authorization - Number of Visits 25  ?OT Time Calculation  ?OT Start Time [id number]  ?OT Stop Time 0933  ?OT Time Calculation (min) 31 mins  ?End of Session  ?Activity Tolerance Patient tolerated treatment well  ?Behavior During Therapy [facility] for tasks assessed/performed  ? ? ? ?Past Medical History:  ?Diagnosis Date  ? Arthritis   ? BPH with obstruction/lower  urinary tract symptoms   ? CKD (chronic kidney disease)   ? DVT (deep venous thrombosis) ([place]) 2016  ? LLE  ? GERD (gastroesophageal reflux disease)   ? Hyperlipidemia   ? Hypertension   ? Panic attacks   ? ?Past Surgical History:  ?Procedure Laterality Date  ? BALLOON DILATION  12/30/2020  ? Procedure: BALLOON DILATION;  Surgeon: Montez Morita, Quillian Quince, MD;  [facility];  Service: Gastroenterology;;  ? BIOPSY  12/30/2020  ? Procedure: BIOPSY;  Surgeon: Harvel Quale, MD;  [facility];  Service: Gastroenterology;;  ? COLONOSCOPY    ? COLONOSCOPY WITH PROPOFOL N/A 12/30/2020  ? Procedure: COLONOSCOPY WITH PROPOFOL;  Surgeon: Harvel Quale, MD;  [facility];  Service: Gastroenterology;  Laterality: N/A;  ? ESOPHAGOGASTRODUODENOSCOPY (EGD) WITH PROPOFOL N/A 12/30/2020  ? Procedure: ESOPHAGOGASTRODUODENOSCOPY (EGD) WITH PROPOFOL;  Surgeon: Harvel Quale, MD;  [facility];  Service: Gastroenterology;  Laterality: N/A;  am  ? ?Patient Active Problem List  ? Diagnosis Date Noted  ? Chronic low back pain without sciatica 07/29/2021  ? Chronic left shoulder pain 07/29/2021  ? Stage 3b chronic kidney disease ([place]) 07/29/2021  ? Tachycardia 03/10/2021  ? Constipation 12/17/2020  ? Early satiety 12/17/2020  ? Abdominal pain 12/17/2020  ? Anemia 12/17/2020  ? BPH (benign prostatic hyperplasia) 10/21/2020  ? Prediabetes 10/21/2020  ? Recurrent deep vein thrombosis (DVT) ([place]) 03/13/2019  ? HLD (hyperlipidemia) 11/01/2006  ? Anxiety 11/01/2006  ? Essential hypertension 11/01/2006  ? Allergic rhinitis 11/01/2006  ? GERD 11/01/2006  ? OSTEOARTHRITIS 11/01/2006  ? ? ?ONSET DATE: 02/24/2021 ? ?REFERRING DIAG: left adhesive  capsulitis ? ?THERAPY DIAG:  ?Chronic left shoulder pain ? ?Stiffness of left shoulder, not elsewhere classified ? ?Other symptoms and signs involving the musculoskeletal system ? ? ?PERTINENT HISTORY: Pt is a 66 y/o male presenting with  left adhesive capsulitis that began mid-2022 with no known cause. Pt had x-rays which were normal, has not had additional imaging.  ? ?PRECAUTIONS: None ? ?SUBJECTIVE: S: I haven't had a chance to do a whole lot.  ? ?PAIN:  ?Are you having pain? No ?Pain location: ?Pain description: ?Aggravating factors:  ?Relieving factors:  ? ? ? ? ?OBJECTIVE: ? ? ?TODAY'S TREATMENT:  ?  12/30/21 ?  -Shoulder stretches: flexion, abduction, er, IR behind back with towel, doorway stretch, 2x20"  ?  -Green theraband strengthening: protraction, flexion, abduction, er/IR, horizontal abduction, 10X each ? ? ? ?12/14/21 ?-Myofascial release: completed separately from therapeutic exercises. myofascial release and manual techniques to left upper arm, trapezius, scapular regions to decrease pain and fascial restrictions and increase joint ROM  ?-P/ROM: shoulder flexion, abduction, er/IR, horizontal abduction, 5X each ?-A/ROM: shoulder flexion, abduction, er/IR, horizontal abduction, 12X each  ?-Green scapular theraband: row, extension, retraction, 10X each ?-X to V arms, 10X  ? ? ?12/07/21 ?-Myofascial release: completed separately from therapeutic exercises. myofascial release and manual techniques to left upper arm, trapezius, scapular regions to decrease pain and fascial restrictions and increase joint ROM  ?-P/ROM: shoulder flexion, abduction, er/IR, horizontal abduction, 5X each ?-A/ROM: shoulder flexion, abduction, er/IR, horizontal abduction, 12X each  ?-Ball pass behind back for IR, 10X using tennis ball  ?-Green scapular theraband: row, extension, retraction, 10X each ?-Ball on wall: 1' flexion, 1' abduction, red ball ?-Pulleys: 1' flexion, 1' abduction ? ? ?PATIENT EDUCATION: ?Education details: educated on HEP completion, green theraband strengthening ?Person educated: Patient ?Education method: Explanation ?Education comprehension: verbalized understanding ? ? ? ? ? OT Short Term Goals   ? ?  ? OT SHORT TERM GOAL #1  ? Title Pt will  be provided with and educated on HEP to improve mobility of LUE required for ADL completion.   ? Time 4   ? Period Weeks   ? Status Achieved  ? Target Date 12/03/21   ?  ? OT SHORT TERM GOAL #2  ? Title Pt will increase LUE P/ROM to [facility] to improve ability to perform dressing tasks with minimal compensatory strategies and no physical assistance.   ? Time 4   ? Period Weeks   ? Status Partially Met   ? ?  ?  ? ?  ? ? ? OT Long Term Goals   ? ?  ? OT LONG TERM GOAL #1  ? Title Pt will decrease pain in LUE to 3/10 or less to improve ability to sleep in position of comfort for 3+ hours without waking due to pain and needing to turn over.   ? Time 8   ? Period Weeks   ? Status Achieved   ? Target Date 01/02/22   ?  ? OT LONG TERM GOAL #2  ? Title Pt will decrease LUE fascial restrictions to trace amounts to improve mobility required for functional reaching tasks.   ? Time 8   ? Period Weeks   ? Status Achieved  ?  ? OT LONG TERM GOAL #3  ? Title Pt will increase LUE A/ROM to improve ability to reach overhead and behind back when reaching for objects or performing dressing/bathing tasks.   ? Time 8   ? Period Weeks   ?  Status Partially Met   ?  ? OT LONG TERM GOAL #4  ? Title Pt will increase activity tolerance in LUE to improve ability to work overhead for more than 2 minutes when completing home chores/repairs.   ? Time 8   ? Period Weeks   ? Status Achieved  ? ?  ?  ? ?  ? ? ? Plan - 12/30/21 0934   ? ? Clinical Impression Statement A: Reassessment completed this session, pt reports he continues to have difficulty with reaching behind his back however reaching overhead has improved. Pt has met 1 STG and 3/4 LTGs with remaining goals partially met. Pt reports he is not consistent in HEP completion, plans to try and improve frequency of completion. Pt has plateaued with ROM compared to previous measurements. Discussed progress with pt and pt is agreeable to discharge today with HEP.   ? Body Structure / Function /  Physical Skills ADL;Endurance;Muscle spasms;UE functional use;Fascial restriction;Pain;ROM;IADL   ? Plan P: Discharge pt   ? OT Home Exercise Plan eval: AA/ROM exercises; L shoulder stretches; 3/1: A/ROM; 3/21

## 2022-01-02 ENCOUNTER — Other Ambulatory Visit (HOSPITAL_COMMUNITY): Payer: Self-pay | Admitting: Hematology

## 2022-01-03 ENCOUNTER — Other Ambulatory Visit (HOSPITAL_COMMUNITY): Payer: Self-pay

## 2022-01-03 MED ORDER — APIXABAN 5 MG PO TABS
5.0000 mg | ORAL_TABLET | Freq: Two times a day (BID) | ORAL | 3 refills | Status: DC
  Filled 2022-01-03: qty 60, 30d supply, fill #0
  Filled 2022-02-07: qty 60, 30d supply, fill #1

## 2022-01-05 ENCOUNTER — Other Ambulatory Visit (HOSPITAL_COMMUNITY): Payer: Self-pay

## 2022-01-06 ENCOUNTER — Other Ambulatory Visit (HOSPITAL_COMMUNITY): Payer: Self-pay

## 2022-01-14 ENCOUNTER — Other Ambulatory Visit: Payer: Self-pay | Admitting: Orthopedic Surgery

## 2022-01-14 DIAGNOSIS — M7502 Adhesive capsulitis of left shoulder: Secondary | ICD-10-CM

## 2022-01-17 ENCOUNTER — Other Ambulatory Visit (HOSPITAL_COMMUNITY): Payer: Self-pay

## 2022-01-17 MED ORDER — PREDNISONE 10 MG (48) PO TBPK
ORAL_TABLET | Freq: Every day | ORAL | 0 refills | Status: DC
  Filled 2022-01-17: qty 48, 12d supply, fill #0

## 2022-01-18 DIAGNOSIS — R338 Other retention of urine: Secondary | ICD-10-CM | POA: Diagnosis not present

## 2022-01-20 ENCOUNTER — Ambulatory Visit (INDEPENDENT_AMBULATORY_CARE_PROVIDER_SITE_OTHER): Payer: Medicare HMO | Admitting: Orthopedic Surgery

## 2022-01-20 ENCOUNTER — Encounter: Payer: Self-pay | Admitting: Orthopedic Surgery

## 2022-01-20 VITALS — Ht 71.0 in | Wt 196.0 lb

## 2022-01-20 DIAGNOSIS — M7502 Adhesive capsulitis of left shoulder: Secondary | ICD-10-CM

## 2022-01-20 DIAGNOSIS — M7582 Other shoulder lesions, left shoulder: Secondary | ICD-10-CM | POA: Diagnosis not present

## 2022-01-20 NOTE — Progress Notes (Signed)
Chief Complaint  ?Patient presents with  ? Shoulder Pain  ?  Lt shoulder pain  ? ?Encounter Diagnoses  ?Name Primary?  ? Adhesive capsulitis of left shoulder Yes  ? Tendinitis of left rotator cuff   ? ? ?Follow-up visit for adhesive capsulitis tendinitis left shoulder ? ?Patient has finished physical therapy and has plateaued he still has a few areas of range of motion that are not quite normal.  His flexion is excellent but his internal rotation still has some deficits as does his external rotation with his arm at his side ? ?I have his last physical therapy note from December 30, 2021 and it indicates that he is not as diligent with his exercises as he would like and he agrees to that today. ? ?Recommend he continue with stretching at home with a home exercise program and follow-up with [place] if things trend in the wrong direction ?

## 2022-01-20 NOTE — Patient Instructions (Signed)
Follow up as needed

## 2022-01-24 ENCOUNTER — Other Ambulatory Visit (HOSPITAL_COMMUNITY): Payer: Self-pay

## 2022-01-24 ENCOUNTER — Other Ambulatory Visit: Payer: Self-pay | Admitting: Internal Medicine

## 2022-01-24 DIAGNOSIS — F419 Anxiety disorder, unspecified: Secondary | ICD-10-CM

## 2022-01-24 MED ORDER — ALPRAZOLAM 0.25 MG PO TABS
0.2500 mg | ORAL_TABLET | Freq: Every day | ORAL | 1 refills | Status: DC
  Filled 2022-01-24 – 2022-03-03 (×2): qty 30, 30d supply, fill #0

## 2022-01-31 ENCOUNTER — Encounter: Payer: Self-pay | Admitting: Internal Medicine

## 2022-01-31 ENCOUNTER — Ambulatory Visit (INDEPENDENT_AMBULATORY_CARE_PROVIDER_SITE_OTHER): Payer: 59 | Admitting: Internal Medicine

## 2022-01-31 VITALS — BP 138/86 | HR 75 | Resp 18 | Ht 71.0 in | Wt 199.8 lb

## 2022-01-31 DIAGNOSIS — Z23 Encounter for immunization: Secondary | ICD-10-CM | POA: Diagnosis not present

## 2022-01-31 DIAGNOSIS — R7303 Prediabetes: Secondary | ICD-10-CM

## 2022-01-31 DIAGNOSIS — I82409 Acute embolism and thrombosis of unspecified deep veins of unspecified lower extremity: Secondary | ICD-10-CM | POA: Diagnosis not present

## 2022-01-31 DIAGNOSIS — E559 Vitamin D deficiency, unspecified: Secondary | ICD-10-CM

## 2022-01-31 DIAGNOSIS — F419 Anxiety disorder, unspecified: Secondary | ICD-10-CM

## 2022-01-31 DIAGNOSIS — G8929 Other chronic pain: Secondary | ICD-10-CM

## 2022-01-31 DIAGNOSIS — R946 Abnormal results of thyroid function studies: Secondary | ICD-10-CM | POA: Diagnosis not present

## 2022-01-31 DIAGNOSIS — N401 Enlarged prostate with lower urinary tract symptoms: Secondary | ICD-10-CM | POA: Diagnosis not present

## 2022-01-31 DIAGNOSIS — I10 Essential (primary) hypertension: Secondary | ICD-10-CM | POA: Diagnosis not present

## 2022-01-31 DIAGNOSIS — Z0001 Encounter for general adult medical examination with abnormal findings: Secondary | ICD-10-CM | POA: Diagnosis not present

## 2022-01-31 DIAGNOSIS — N1832 Chronic kidney disease, stage 3b: Secondary | ICD-10-CM

## 2022-01-31 DIAGNOSIS — R338 Other retention of urine: Secondary | ICD-10-CM

## 2022-01-31 DIAGNOSIS — M545 Low back pain, unspecified: Secondary | ICD-10-CM

## 2022-01-31 NOTE — Assessment & Plan Note (Signed)
On Eliquis Follows up with Heme/Onc. 

## 2022-01-31 NOTE — Progress Notes (Signed)
Established Patient Office Visit  Subjective:  Patient ID: Bruce Adkins, male    DOB: May 07, 1956  Age: 66 y.o. MRN: [id number]  CC:  Chief Complaint  Patient presents with   Annual Exam    Annual exam pt has been having right side pain for year thinks it arthritis     HPI Bruce Adkins is a 66 y.o. male with past medical history of recurrent unprovoked DVT, HTN, BPH, HLD, anxiety and seasonal allergies who presents for annual physical.  He also c/o chronic low back pain and has right flank/RLQ pain at times, which is worse with lying flat. He denies any recent injury or heavy lifting. He has thoracic spine X-ray recently, which was negative for any acute pathology. He has tried Baclofen, which helped with his back discomfort.   HTN: He has been taking Lisinopril 2.5 mg now. BP is well-controlled. Takes medications regularly. Patient denies headache, dizziness, chest pain, dyspnea or palpitations.   CKD: Followed by Nephrology. Denies any dysuria or hematuria.   GERD: Takes Pepcid. Denies any dysphagia or odynophagia.  BPH: Had Urolift procedure, but still has urinary retention. Denies any dysuria or hematuria currently. He self-catheterizes x3 daily.     Past Medical History:  Diagnosis Date   Arthritis    BPH with obstruction/lower urinary tract symptoms    CKD (chronic kidney disease)    DVT (deep venous thrombosis) ([facility]) 2016   LLE   GERD (gastroesophageal reflux disease)    Hyperlipidemia    Hypertension    Panic attacks     Past Surgical History:  Procedure Laterality Date   BALLOON DILATION  12/30/2020   Procedure: BALLOON DILATION;  Surgeon: Dolores Frame, MD;  [facility];  Service: Gastroenterology;;   BIOPSY  12/30/2020   Procedure: BIOPSY;  Surgeon: Dolores Frame, MD;  [facility];  Service: Gastroenterology;;   COLONOSCOPY     COLONOSCOPY WITH PROPOFOL N/A 12/30/2020   Procedure: COLONOSCOPY WITH PROPOFOL;   Surgeon: Dolores Frame, MD;  [facility];  Service: Gastroenterology;  Laterality: N/A;   ESOPHAGOGASTRODUODENOSCOPY (EGD) WITH PROPOFOL N/A 12/30/2020   Procedure: ESOPHAGOGASTRODUODENOSCOPY (EGD) WITH PROPOFOL;  Surgeon: Dolores Frame, MD;  [facility];  Service: Gastroenterology;  Laterality: N/A;  am    Family History  Problem Relation Age of Onset   Heart failure Mother    Hypertension Mother    Anxiety disorder Mother    Hypertension Father    Cancer Brother     Social History   Socioeconomic History   Marital status: Married    Spouse name: Darryn Fugua   Number of children: 3   Years of education: Not on file   Highest education level: Not on file  Occupational History   Not on file  Tobacco Use   Smoking status: Never   Smokeless tobacco: Never  Vaping Use   Vaping Use: Never used  Substance and Sexual Activity   Alcohol use: No   Drug use: Not Currently   Sexual activity: Not on file  Other Topics Concern   Not on file  Social History Narrative   Not on file   Social Determinants of Health   Financial Resource Strain: Not on file  Food Insecurity: Not on file  Transportation Needs: Not on file  Physical Activity: Not on file  Stress: Not on file  Social Connections: Not on file  Intimate Partner Violence: Not on file  Outpatient Medications Prior to Visit  Medication Sig Dispense Refill   acetaminophen (TYLENOL) 500 MG tablet Take 1,000 mg by mouth every 6 (six) hours as needed for mild pain.     ALPRAZolam (XANAX) 0.25 MG tablet Take 1 tablet (0.25 mg total) by mouth at bedtime. 30 tablet 1   apixaban (ELIQUIS) 5 MG TABS tablet Take 1 tablet (5 mg total) by mouth 2 (two) times daily. 60 tablet 3   baclofen (LIORESAL) 10 MG tablet Take 1 tablet (10 mg total) by mouth at bedtime as needed for muscle spasms. 30 each 2   bethanechol (URECHOLINE) 25 MG tablet Take 1 tablet (25 mg total) by mouth 3 (three)  times daily. 90 tablet 2   lisinopril (ZESTRIL) 5 MG tablet Take 1 tablet (5 mg total) by mouth daily. 90 tablet 3   predniSONE (STERAPRED UNI-PAK 48 TAB) 10 MG (48) TBPK tablet take as directed on package 48 tablet 0   sodium bicarbonate 650 MG tablet Take 1 tablet (650 mg total) by mouth in the morning, in the evening, and at bedtime. 90 tablet 2   tamsulosin (FLOMAX) 0.4 MG CAPS capsule Take 1 capsule (0.4 mg total) by mouth daily. 30 capsule 2   traMADol (ULTRAM) 50 MG tablet Take 1 tablet (50 mg total) by mouth every 6 (six) hours as needed. 60 tablet 5   famotidine (PEPCID) 40 MG tablet TAKE 1 TABLET (40 MG TOTAL) BY MOUTH DAILY. (Patient taking differently: Take 40 mg by mouth daily as needed for heartburn or indigestion.) 30 tablet 3   No facility-administered medications prior to visit.    No Known Allergies  ROS Review of Systems  Constitutional:  Negative for chills and fever.  HENT:  Negative for congestion and sore throat.   Eyes:  Negative for pain and discharge.  Respiratory:  Negative for cough and shortness of breath.   Cardiovascular:  Negative for chest pain and palpitations.  Gastrointestinal:  Negative for constipation, diarrhea and vomiting.  Endocrine: Negative for polydipsia and polyuria.  Genitourinary:  Positive for difficulty urinating. Negative for dysuria and hematuria.  Musculoskeletal:  Positive for arthralgias (L shoulder) and back pain. Negative for neck pain and neck stiffness.  Skin:  Negative for rash.  Neurological:  Negative for dizziness, weakness, numbness and headaches.  Psychiatric/Behavioral:  Negative for agitation and behavioral problems.      Objective:    Physical Exam Vitals reviewed.  Constitutional:      General: He is not in acute distress.    Appearance: He is not diaphoretic.  HENT:     Head: Normocephalic and atraumatic.     Nose: Nose normal.     Mouth/Throat:     Mouth: Mucous membranes are moist.  Eyes:     General: No  scleral icterus.    Extraocular Movements: Extraocular movements intact.  Cardiovascular:     Rate and Rhythm: Normal rate and regular rhythm.     Pulses: Normal pulses.     Heart sounds: Normal heart sounds. No murmur heard. Pulmonary:     Breath sounds: Normal breath sounds. No wheezing or rales.  Abdominal:     General: There is no distension.     Palpations: Abdomen is soft.     Tenderness: There is no abdominal tenderness. There is no guarding or rebound.  Musculoskeletal:        General: No tenderness (Lumbar spine).     Cervical back: Neck supple. No tenderness.     Right  lower leg: No edema.     Left lower leg: No edema.     Comments: ROM limited over left shoulder  Skin:    General: Skin is warm.     Findings: No rash.  Neurological:     General: No focal deficit present.     Mental Status: He is alert and oriented to person, place, and time.     Cranial Nerves: No cranial nerve deficit.     Sensory: No sensory deficit.  Psychiatric:        Mood and Affect: Mood normal.        Behavior: Behavior normal.    BP 138/86 (BP Location: Right Arm, Patient Position: Sitting, Cuff Size: Normal)   Pulse 75   Resp 18   Ht 5\' 11"  (1.803 m)   Wt 199 lb 12.8 oz (90.6 kg)   SpO2 97%   BMI 27.87 kg/m  Wt Readings from Last 3 Encounters:  01/31/22 199 lb 12.8 oz (90.6 kg)  01/20/22 196 lb (88.9 kg)  10/21/21 196 lb (88.9 kg)    Lab Results  Component Value Date   TSH 1.53 12/17/2020   Lab Results  Component Value Date   WBC 13.6 (H) 06/13/2021   HGB 12.9 (L) 06/13/2021   HCT 39.3 06/13/2021   MCV 93.1 06/13/2021   PLT 248 06/13/2021   Lab Results  Component Value Date   NA 131 (L) 06/13/2021   K 4.5 06/13/2021   CO2 23 06/13/2021   GLUCOSE 105 (H) 06/13/2021   BUN 28 (H) 06/13/2021   CREATININE 2.25 (H) 06/13/2021   BILITOT 0.6 04/08/2021   ALKPHOS 86 04/08/2021   AST 19 04/08/2021   ALT 16 04/08/2021   PROT 7.4 04/08/2021   ALBUMIN 4.1 04/08/2021    CALCIUM 8.3 (L) 06/13/2021   ANIONGAP 8 06/13/2021   EGFR 10 (L) 01/19/2021   Lab Results  Component Value Date   CHOL 167 03/11/2019   Lab Results  Component Value Date   HDL 44 03/11/2019   Lab Results  Component Value Date   LDLCALC 105 (H) 03/11/2019   Lab Results  Component Value Date   TRIG 88 03/11/2019   Lab Results  Component Value Date   CHOLHDL 3.8 03/11/2019   Lab Results  Component Value Date   HGBA1C 5.8 12/03/2020   HGBA1C 5.8 12/03/2020      Assessment & Plan:   Problem List Items Addressed This Visit       Cardiovascular and Mediastinum   Essential hypertension    BP Readings from Last 1 Encounters:  01/31/22 138/86  Well-controlled with Lisinopril 2.5 mg QD, followed by Nephrology Counseled for compliance with the medications Advised DASH diet and moderate exercise/walking, at least 150 mins/week       Recurrent deep vein thrombosis (DVT) ([facility])    On Eliquis Follows up with Heme/Onc.         Genitourinary   BPH (benign prostatic hyperplasia)    On Flomax and Bethanechol S/p Urolift, followed by Urology Self-catheterizes currently       Stage 3b chronic kidney disease (HCC)    Last BMP reviewed, GFR improved to 42 in it Followed by Nephrology Advised to avoid oral NSAIDs On Lisinopril On Sod Bicarb tablets       Relevant Orders   CMP14+EGFR     Other   Anxiety   Prediabetes   Relevant Orders   Hemoglobin A1c   Chronic low back pain without sciatica  Likely has referred pain to right side of back and RLQ/flank area Check X-ray lumbar spine Tylenol PRN Unable to take NSAIDs Baclofen PRN for muscle spasms       Encounter for general adult medical examination with abnormal findings - Primary    Physical exam as documented. Fasting blood tests ordered. First dose of Shingrix vaccine today.       Relevant Orders   TSH   CMP14+EGFR   CBC with Differential/Platelet   Lipid Profile   Other Visit Diagnoses      Vitamin D deficiency       Relevant Orders   VITAMIN D 25 Hydroxy (Vit-D Deficiency, Fractures)   Need for varicella vaccine       Relevant Orders   Varicella-zoster vaccine IM (Shingrix) (Completed)       No orders of the defined types were placed in this encounter.   Follow-up: Return in about 6 months (around 08/03/2022) for HTN and GAD.    Anabel Halon, MD

## 2022-01-31 NOTE — Patient Instructions (Signed)
Please continue to take medications as prescribed. ? ?Please avoid heavy lifting and frequent bending. Please perform simple back exercises. Okay to take Tylenol as needed for back pain. ? ? ? ?

## 2022-01-31 NOTE — Assessment & Plan Note (Signed)
Likely has referred pain to right side of back and RLQ/flank area Check X-ray lumbar spine Tylenol PRN Unable to take NSAIDs Baclofen PRN for muscle spasms 

## 2022-01-31 NOTE — Assessment & Plan Note (Signed)
On Flomax and Bethanechol ?S/p Urolift, followed by Urology ?Self-catheterizes currently ?

## 2022-01-31 NOTE — Assessment & Plan Note (Signed)
Last BMP reviewed, GFR improved to 42 in it ?Followed by Nephrology ?Advised to avoid oral NSAIDs ?On Lisinopril ?On Sod Bicarb tablets ?

## 2022-01-31 NOTE — Assessment & Plan Note (Signed)
BP Readings from Last 1 Encounters:  ?01/31/22 138/86  ? ?Well-controlled with Lisinopril 2.5 mg QD, followed by Nephrology ?Counseled for compliance with the medications ?Advised DASH diet and moderate exercise/walking, at least 150 mins/week ? ?

## 2022-01-31 NOTE — Assessment & Plan Note (Signed)
Physical exam as documented. ?Fasting blood tests ordered. ?First dose of Shingrix vaccine today. ?

## 2022-02-01 ENCOUNTER — Other Ambulatory Visit: Payer: Self-pay | Admitting: *Deleted

## 2022-02-01 DIAGNOSIS — R946 Abnormal results of thyroid function studies: Secondary | ICD-10-CM

## 2022-02-01 LAB — CMP14+EGFR
ALT: 15 IU/L (ref 0–44)
AST: 11 IU/L (ref 0–40)
Albumin/Globulin Ratio: 1.8 (ref 1.2–2.2)
Albumin: 4.4 g/dL (ref 3.8–4.8)
Alkaline Phosphatase: 71 IU/L (ref 44–121)
BUN/Creatinine Ratio: 13 (ref 10–24)
BUN: 22 mg/dL (ref 8–27)
Bilirubin Total: 0.5 mg/dL (ref 0.0–1.2)
CO2: 25 mmol/L (ref 20–29)
Calcium: 9.4 mg/dL (ref 8.6–10.2)
Chloride: 102 mmol/L (ref 96–106)
Creatinine, Ser: 1.71 mg/dL — ABNORMAL HIGH (ref 0.76–1.27)
Globulin, Total: 2.5 g/dL (ref 1.5–4.5)
Glucose: 101 mg/dL — ABNORMAL HIGH (ref 70–99)
Potassium: 5 mmol/L (ref 3.5–5.2)
Sodium: 140 mmol/L (ref 134–144)
Total Protein: 6.9 g/dL (ref 6.0–8.5)
eGFR: 44 mL/min/{1.73_m2} — ABNORMAL LOW (ref >59)

## 2022-02-01 LAB — CBC WITH DIFFERENTIAL/PLATELET
Basophils Absolute: 0 10*3/uL (ref 0.0–0.2)
Basos: 0 %
EOS (ABSOLUTE): 0 10*3/uL (ref 0.0–0.4)
Eos: 0 %
Hematocrit: 41.1 % (ref 37.5–51.0)
Hemoglobin: 13.8 g/dL (ref 13.0–17.7)
Immature Grans (Abs): 0.1 10*3/uL (ref 0.0–0.1)
Immature Granulocytes: 1 %
Lymphocytes Absolute: 2 10*3/uL (ref 0.7–3.1)
Lymphs: 18 %
MCH: 31.3 pg (ref 26.6–33.0)
MCHC: 33.6 g/dL (ref 31.5–35.7)
MCV: 93 fL (ref 79–97)
Monocytes Absolute: 0.7 10*3/uL (ref 0.1–0.9)
Monocytes: 6 %
Neutrophils Absolute: 8.5 10*3/uL — ABNORMAL HIGH (ref 1.4–7.0)
Neutrophils: 75 %
Platelets: 216 10*3/uL (ref 150–450)
RBC: 4.41 x10E6/uL (ref 4.14–5.80)
RDW: 14.7 % (ref 11.6–15.4)
WBC: 11.4 10*3/uL — ABNORMAL HIGH (ref 3.4–10.8)

## 2022-02-01 LAB — TSH: TSH: 0.313 u[IU]/mL — ABNORMAL LOW (ref 0.450–4.500)

## 2022-02-01 LAB — LIPID PANEL
Chol/HDL Ratio: 3 ratio (ref 0.0–5.0)
Cholesterol, Total: 202 mg/dL — ABNORMAL HIGH (ref 100–199)
HDL: 67 mg/dL (ref >39)
LDL Chol Calc (NIH): 124 mg/dL — ABNORMAL HIGH (ref 0–99)
Triglycerides: 62 mg/dL (ref 0–149)
VLDL Cholesterol Cal: 11 mg/dL (ref 5–40)

## 2022-02-01 LAB — HEMOGLOBIN A1C
Est. average glucose Bld gHb Est-mCnc: 126 mg/dL
Hgb A1c MFr Bld: 6 % — ABNORMAL HIGH (ref 4.8–5.6)

## 2022-02-01 LAB — VITAMIN D 25 HYDROXY (VIT D DEFICIENCY, FRACTURES): Vit D, 25-Hydroxy: 28.9 ng/mL — ABNORMAL LOW (ref 30.0–100.0)

## 2022-02-02 ENCOUNTER — Telehealth: Payer: Self-pay

## 2022-02-02 LAB — T4, FREE: Free T4: 1.49 ng/dL (ref 0.82–1.77)

## 2022-02-02 LAB — SPECIMEN STATUS REPORT

## 2022-02-02 NOTE — Telephone Encounter (Signed)
-----   Message from Marcine Matar, MD sent at 01/31/2022  9:02 AM EDT ----- ?Please call pt--I reviewed urodynamics done @ [facility]. He does have a fair amount of bladder contractility--there is a good chance that repeat procedure ( I favor TURP) could get him voiding better w/o catheter. If he wants to sched we can do that or I can talk to him in office ? ?

## 2022-02-02 NOTE — Telephone Encounter (Signed)
Patient notified and wishes to proceed with TURP ?Message sent to MD ?

## 2022-02-08 ENCOUNTER — Other Ambulatory Visit (HOSPITAL_COMMUNITY): Payer: Self-pay

## 2022-02-10 DIAGNOSIS — R339 Retention of urine, unspecified: Secondary | ICD-10-CM | POA: Diagnosis not present

## 2022-02-10 NOTE — Telephone Encounter (Signed)
Pt aware.

## 2022-02-14 ENCOUNTER — Other Ambulatory Visit: Payer: Self-pay | Admitting: Urology

## 2022-03-03 ENCOUNTER — Other Ambulatory Visit (HOSPITAL_COMMUNITY): Payer: Self-pay

## 2022-03-04 ENCOUNTER — Other Ambulatory Visit (HOSPITAL_COMMUNITY): Payer: Self-pay

## 2022-03-04 ENCOUNTER — Encounter (HOSPITAL_BASED_OUTPATIENT_CLINIC_OR_DEPARTMENT_OTHER): Payer: Self-pay | Admitting: Urology

## 2022-03-04 NOTE — Progress Notes (Signed)
Spoke w/ via phone for pre-op interview--- Bruce Adkins needs dos----   ISTAT            Adkins results------Current EKG in Epic 6/22. COVID test -----patient states asymptomatic no test needed Arrive at -------0930 NPO after MN NO Solid Food.  Clear liquids from MN until--- Med rec completed Medications to take morning of surgery ----- Urocholine Diabetic medication ----- Patient instructed no nail polish to be worn day of surgery Patient instructed to bring photo id and insurance card day of surgery Patient aware to have Driver (ride ) / caregiver  Bruce Adkins  for 24 hours after surgery  Patient Special Instructions ----- Pre-Op special Istructions ----- Pt instructed by MD to hold Eliquis 48 hrs preop. Patient verbalized understanding and stated he had instruction sheet from MD about holding the medication. Patient verbalized understanding of instructions that were given at this phone interview. Patient denies shortness of breath, chest pain, fever, cough at this phone interview.

## 2022-03-09 NOTE — Discharge Instructions (Addendum)
Transurethral Resection of the Prostate  Care After  Refer to this sheet in the next few weeks. These discharge instructions provide you with general information on caring for yourself after you leave the hospital. Your caregiver may also give you specific instructions. Your treatment has been planned according to the most current medical practices available, but unavoidable complications sometimes occur. If you have any problems or questions after discharge, please call your caregiver.  HOME CARE INSTRUCTIONS   Medications You may receive medicine for pain management. As your level of discomfort decreases, adjustments in your pain medicines may be made.  Take all medicines as directed.  You may be given a medicine (antibiotic) to kill germs following surgery. Finish all medicines. Let your caregiver know if you have any side effects or problems from the medicine.  If you are on aspirin, it would be best not to restart the aspirin until the blood in the urine clears Hygiene You can take a shower after surgery.  You should not take a bath while you still have the urethral catheter. Activity You will be encouraged to get out of bed as much as possible and increase your activity level as tolerated.  Spend the first week in and around your home. For 3 weeks, avoid the following:  Straining.  Running.  Strenuous work.  Walks longer than a few blocks.  Riding for extended periods.  Sexual relations.  Do not lift heavy objects (more than 20 pounds) for at least 1 month. When lifting, use your arms instead of your abdominal muscles.  You will be encouraged to walk as tolerated. Do not exert yourself. Increase your activity level slowly. Remember that it is important to keep moving after an operation of any type. This cuts down on the possibility of developing blood clots.  Your caregiver will tell you when you can resume driving and light housework. Discuss this at your first office visit after  discharge. Diet No special diet is ordered after a TURP. However, if you are on a special diet for another medical problem, it should be continued.  Normal fluid intake is usually recommended.  Avoid alcohol and caffeinated drinks for 2 weeks. They irritate the bladder. Decaffeinated drinks are okay.  Avoid spicy foods.  Bladder Function For the first 10 days, empty the bladder whenever you feel a definite desire. Do not try to hold the urine for long periods of time.  Urinating once or twice a night even after you are healed is not uncommon.  You may see some recurrence of blood in the urine after discharge from the hospital. This usually happens within 2 weeks after the procedure.If this occurs, force fluids again as you did in the hospital and reduce your activity.  Bowel Function You may experience some constipation after surgery. This can be minimized by increasing fluids and fiber in your diet. Drink enough water and fluids to keep your urine clear or pale yellow.  A stool softener may be prescribed for use at home. Do not strain to move your bowels.  If you are requiring increased pain medicine, it is important that you take stool softeners to prevent constipation. This will help to promote proper healing by reducing the need to strain to move your bowels.  Sexual Activity Semen movement in the opposite direction and into the bladder (retrograde ejaculation) may occur. Since the semen passes into the bladder, cloudy urine can occur the first time you urinate after intercourse. Or, you may not have an  ejaculation during erection. Ask your caregiver when you can resume sexual activity. Retrograde ejaculation and reduced semen discharge should not reduce one's pleasure of intercourse.  Postoperative Visit Arrange the date and time of your after surgery visit with your caregiver.  Return to Work After your recovery is complete, you will be able to return to work and resume all activities. Your  caregiver will inform you when you can return to work.  Foley Catheter Care A soft, flexible tube (Foley catheter) may have been placed in your bladder to drain urine and fluid. Follow these instructions: Taking Care of the Catheter Keep the area where the catheter leaves your body clean.  Attach the catheter to the leg so there is no tension on the catheter.  Keep the drainage bag below the level of the bladder, but keep it OFF the floor.  Do not take long soaking baths. Your caregiver will give instructions about showering.  Wash your hands before touching ANYTHING related to the catheter or bag.  Using mild soap and warm water on a washcloth:  Clean the area closest to the catheter insertion site using a circular motion around the catheter.  Clean the catheter itself by wiping AWAY from the insertion site for several inches down the tube.  NEVER wipe upward as this could sweep bacteria up into the urethra (tube in your body that normally drains the bladder) and cause infection.  Place a small amount of sterile lubricant at the tip of the penis where the catheter is entering.  Taking Care of the Drainage Bags Two drainage bags may be taken home: a large overnight drainage bag, and a smaller leg bag which fits underneath clothing.  It is okay to wear the overnight bag at any time, but NEVER wear the smaller leg bag at night.  Keep the drainage bag well below the level of your bladder. This prevents backflow of urine into the bladder and allows the urine to drain freely.  Anchor the tubing to your leg to prevent pulling or tension on the catheter. Use tape or a leg strap provided by the hospital.  Empty the drainage bag when it is 1/2 to 3/4 full. Wash your hands before and after touching the bag.  Periodically check the tubing for kinks to make sure there is no pressure on the tubing which could restrict the flow of urine.  Changing the Drainage Bags Cleanse both ends of the clean bag with  alcohol before changing.  Pinch off the rubber catheter to avoid urine spillage during the disconnection.  Disconnect the dirty bag and connect the clean one.  Empty the dirty bag carefully to avoid a urine spill.  Attach the new bag to the leg with tape or a leg strap.  Cleaning the Drainage Bags Whenever a drainage bag is disconnected, it must be cleaned quickly so it is ready for the next use.  Wash the bag in warm, soapy water.  Rinse the bag thoroughly with warm water.  Soak the bag for 30 minutes in a solution of white vinegar and water (1 cup vinegar to 1 quart warm water).  Rinse with warm water.  SEEK MEDICAL CARE IF:  You have chills or night sweats.  You are leaking around your catheter or have problems with your catheter. It is not uncommon to have sporadic leakage around your catheter as a result of bladder spasms. If the leakage stops, there is not much need for concern. If you are uncertain, call your  caregiver.  You develop side effects that you think are coming from your medicines.  SEEK IMMEDIATE MEDICAL CARE IF:  You are suddenly unable to urinate. Check to see if there are any kinks in the drainage tubing that may cause this. If you cannot find any kinks, call your caregiver immediately. This is an emergency.  You develop shortness of breath or chest pains.  Bleeding persists or clots develop in your urine.  You have a fever.  You develop pain in your back or over your lower belly (abdomen).  You develop pain or swelling in your legs.  Any problems you are having get worse rather than better.  MAKE SURE YOU:  Understand these instructions.  Will watch your condition.  Will get help right away if you are not doing well or get worse.    It is OK to restart the Eliquis once urine turns yellow

## 2022-03-09 NOTE — H&P (Signed)
H&P  Chief Complaint: Urinary retention, large prostate  History of Present Illness: This 66 year old gentleman presents at this time for TURP for management of BPH with obstruction.  His history is as follows:  4.26.2022: Initial presentation w/ AUR w/ (B) hydro, resolved with catheter placement. Multiple voiding trial on med Rx unsuccessful.   9.26.2022: Urolift performed (TURP recommended). Prostate volume 47 mL. 7 implants placed.   12.6.2022: He is self catheterizing 3 times a day.  He does have spontaneous voids, usually early in the morning and later in the daytime.  In between, he does not void that much.  No problems with catheterizing, no blood in the urine, no dysuria.  Renal function is stable, followed by Dr. Wolfgang Phoenix.  Follow-up renal ultrasound revealed no residual hydronephrosis.  There was bladder wall thickening.   3.28.2023: Here for recheck.  He still is on self-catheterization on average about 5 times every 24 hours.  No problems with catheterization.  He has had no significantly cloudy urine or blood.  He has rare spontaneous voids but he does have occasional feelings of urgency.  He underwent urodynamics.  There is obstruction.  He has mild to moderate contractility of the bladder.  He presents at this time for TURP.  Past Medical History:  Diagnosis Date   Arthritis    BPH with obstruction/lower urinary tract symptoms    CKD (chronic kidney disease)    DVT (deep venous thrombosis) (HCC) 2016   LLE   GERD (gastroesophageal reflux disease)    Hyperlipidemia    Hypertension    Panic attacks     Past Surgical History:  Procedure Laterality Date   BALLOON DILATION  12/30/2020   Procedure: BALLOON DILATION;  Surgeon: Dolores Frame, MD;  Location: [facility];  Service: Gastroenterology;;   BIOPSY  12/30/2020   Procedure: BIOPSY;  Surgeon: Dolores Frame, MD;  Location: [facility];  Service: Gastroenterology;;   COLONOSCOPY     COLONOSCOPY  WITH PROPOFOL N/A 12/30/2020   Procedure: COLONOSCOPY WITH PROPOFOL;  Surgeon: Dolores Frame, MD;  Location: [facility];  Service: Gastroenterology;  Laterality: N/A;   ESOPHAGOGASTRODUODENOSCOPY (EGD) WITH PROPOFOL N/A 12/30/2020   Procedure: ESOPHAGOGASTRODUODENOSCOPY (EGD) WITH PROPOFOL;  Surgeon: Dolores Frame, MD;  Location: [facility];  Service: Gastroenterology;  Laterality: N/A;  am    Home Medications:  Allergies as of 03/09/2022   No Known Allergies      Medication List      Notice   Cannot display discharge medications because the patient has not yet been admitted.     Allergies: No Known Allergies  Family History  Problem Relation Age of Onset   Heart failure Mother    Hypertension Mother    Anxiety disorder Mother    Hypertension Father    Cancer Brother     Social History:  reports that he has never smoked. He has never used smokeless tobacco. He reports that he does not currently use drugs. He reports that he does not drink alcohol.  ROS: A complete review of systems was performed.  All systems are negative except for pertinent findings as noted.  Physical Exam:  Vital signs in last 24 hours: Ht 5\' 11"  (1.803 m)   Wt 90.7 kg   BMI 27.89 kg/m  Constitutional:  Alert and oriented, No acute distress Cardiovascular: Regular rate  Respiratory: Normal respiratory effort GI: Abdomen is soft, nontender, nondistended, no abdominal masses. No CVAT.  Genitourinary: Normal male phallus, testes  are descended bilaterally and non-tender and without masses, scrotum is normal in appearance without lesions or masses, perineum is normal on inspection. Lymphatic: No lymphadenopathy Neurologic: Grossly intact, no focal deficits Psychiatric: Normal mood and affect  I have reviewed prior pt notes   Impression/Assessment:  BPH with persistent retention/dependence on self-catheterization despite UroLift procedure in September 2022.  Plan:   TURP

## 2022-03-10 ENCOUNTER — Encounter (HOSPITAL_BASED_OUTPATIENT_CLINIC_OR_DEPARTMENT_OTHER): Payer: Self-pay | Admitting: Urology

## 2022-03-10 ENCOUNTER — Other Ambulatory Visit: Payer: Self-pay

## 2022-03-10 ENCOUNTER — Ambulatory Visit (HOSPITAL_BASED_OUTPATIENT_CLINIC_OR_DEPARTMENT_OTHER): Payer: 59 | Admitting: Certified Registered Nurse Anesthetist

## 2022-03-10 ENCOUNTER — Encounter (HOSPITAL_COMMUNITY)
Admission: RE | Disposition: A | Discharge status: Home / Self Care | Payer: Self-pay | Source: Home / Self Care | Attending: Urology

## 2022-03-10 ENCOUNTER — Observation Stay (HOSPITAL_BASED_OUTPATIENT_CLINIC_OR_DEPARTMENT_OTHER)
Admission: RE | Admit: 2022-03-10 | Discharge: 2022-03-11 | Disposition: A | Discharge status: Home / Self Care | Payer: 59 | Attending: Urology | Admitting: Urology

## 2022-03-10 DIAGNOSIS — R338 Other retention of urine: Secondary | ICD-10-CM | POA: Insufficient documentation

## 2022-03-10 DIAGNOSIS — Z79899 Other long term (current) drug therapy: Secondary | ICD-10-CM | POA: Diagnosis not present

## 2022-03-10 DIAGNOSIS — I129 Hypertensive chronic kidney disease with stage 1 through stage 4 chronic kidney disease, or unspecified chronic kidney disease: Secondary | ICD-10-CM | POA: Insufficient documentation

## 2022-03-10 DIAGNOSIS — N138 Other obstructive and reflux uropathy: Secondary | ICD-10-CM | POA: Insufficient documentation

## 2022-03-10 DIAGNOSIS — N401 Enlarged prostate with lower urinary tract symptoms: Principal | ICD-10-CM | POA: Insufficient documentation

## 2022-03-10 DIAGNOSIS — N400 Enlarged prostate without lower urinary tract symptoms: Secondary | ICD-10-CM | POA: Diagnosis not present

## 2022-03-10 DIAGNOSIS — I10 Essential (primary) hypertension: Secondary | ICD-10-CM

## 2022-03-10 DIAGNOSIS — N189 Chronic kidney disease, unspecified: Secondary | ICD-10-CM | POA: Insufficient documentation

## 2022-03-10 HISTORY — PX: TRANSURETHRAL RESECTION OF PROSTATE: SHX73

## 2022-03-10 LAB — POCT I-STAT, CHEM 8
BUN: 17 mg/dL (ref 8–23)
Calcium, Ion: 1.24 mmol/L (ref 1.15–1.40)
Chloride: 105 mmol/L (ref 98–111)
Creatinine, Ser: 1.7 mg/dL — ABNORMAL HIGH (ref 0.61–1.24)
Glucose, Bld: 106 mg/dL — ABNORMAL HIGH (ref 70–99)
HCT: 42 % (ref 39.0–52.0)
Hemoglobin: 14.3 g/dL (ref 13.0–17.0)
Potassium: 3.9 mmol/L (ref 3.5–5.1)
Sodium: 142 mmol/L (ref 135–145)
TCO2: 26 mmol/L (ref 22–32)

## 2022-03-10 SURGERY — TURP (TRANSURETHRAL RESECTION OF PROSTATE)
Anesthesia: General | Site: Prostate

## 2022-03-10 MED ORDER — SULFAMETHOXAZOLE-TRIMETHOPRIM 800-160 MG PO TABS
1.0000 | ORAL_TABLET | Freq: Two times a day (BID) | ORAL | Status: DC
  Administered 2022-03-10 – 2022-03-11 (×2): 1 via ORAL
  Filled 2022-03-10 (×3): qty 1

## 2022-03-10 MED ORDER — MIDAZOLAM HCL 2 MG/2ML IJ SOLN
INTRAMUSCULAR | Status: AC
  Filled 2022-03-10: qty 2

## 2022-03-10 MED ORDER — ROCURONIUM BROMIDE 10 MG/ML (PF) SYRINGE
PREFILLED_SYRINGE | INTRAVENOUS | Status: AC
  Filled 2022-03-10: qty 10

## 2022-03-10 MED ORDER — SODIUM CHLORIDE 0.45 % IV SOLN: INTRAVENOUS | Status: DC

## 2022-03-10 MED ORDER — SODIUM CHLORIDE 0.9 % IR SOLN
3000.0000 mL | Status: DC
  Administered 2022-03-10 – 2022-03-11 (×2): 3000 mL

## 2022-03-10 MED ORDER — 0.9 % SODIUM CHLORIDE (POUR BTL) OPTIME
TOPICAL | Status: DC | PRN
  Administered 2022-03-10: 500 mL
  Administered 2022-03-10: 1000 mL

## 2022-03-10 MED ORDER — DEXAMETHASONE SODIUM PHOSPHATE 10 MG/ML IJ SOLN
INTRAMUSCULAR | Status: AC
  Filled 2022-03-10: qty 1

## 2022-03-10 MED ORDER — LACTATED RINGERS IV SOLN: INTRAVENOUS | Status: DC

## 2022-03-10 MED ORDER — PHENYLEPHRINE 80 MCG/ML (10ML) SYRINGE FOR IV PUSH (FOR BLOOD PRESSURE SUPPORT)
PREFILLED_SYRINGE | INTRAVENOUS | Status: AC
  Filled 2022-03-10: qty 10

## 2022-03-10 MED ORDER — CEFAZOLIN SODIUM-DEXTROSE 2-4 GM/100ML-% IV SOLN
2.0000 g | INTRAVENOUS | Status: AC
  Administered 2022-03-10: 2 g via INTRAVENOUS

## 2022-03-10 MED ORDER — SODIUM CHLORIDE 0.9 % IR SOLN
Status: DC | PRN
  Administered 2022-03-10: 1000 mL
  Administered 2022-03-10 (×2): 6000 mL

## 2022-03-10 MED ORDER — DEXAMETHASONE SODIUM PHOSPHATE 10 MG/ML IJ SOLN
INTRAMUSCULAR | Status: DC | PRN
  Administered 2022-03-10: 10 mg via INTRAVENOUS

## 2022-03-10 MED ORDER — SENNA 8.6 MG PO TABS
1.0000 | ORAL_TABLET | Freq: Two times a day (BID) | ORAL | Status: DC
  Administered 2022-03-10 – 2022-03-11 (×3): 8.6 mg via ORAL
  Filled 2022-03-10 (×3): qty 1

## 2022-03-10 MED ORDER — PROPOFOL 10 MG/ML IV BOLUS
INTRAVENOUS | Status: AC
  Filled 2022-03-10: qty 20

## 2022-03-10 MED ORDER — FENTANYL CITRATE (PF) 100 MCG/2ML IJ SOLN
INTRAMUSCULAR | Status: DC | PRN
Start: 2022-03-10 — End: 2022-03-10
  Administered 2022-03-10 (×2): 50 ug via INTRAVENOUS
  Administered 2022-03-10: 25 ug via INTRAVENOUS

## 2022-03-10 MED ORDER — HYDROCODONE-ACETAMINOPHEN 5-325 MG PO TABS
1.0000 | ORAL_TABLET | ORAL | Status: DC | PRN
  Administered 2022-03-10: 1 via ORAL
  Filled 2022-03-10: qty 1

## 2022-03-10 MED ORDER — FENTANYL CITRATE (PF) 100 MCG/2ML IJ SOLN
INTRAMUSCULAR | Status: AC
  Filled 2022-03-10: qty 2

## 2022-03-10 MED ORDER — LIDOCAINE HCL (PF) 2 % IJ SOLN
INTRAMUSCULAR | Status: AC
  Filled 2022-03-10: qty 5

## 2022-03-10 MED ORDER — SODIUM CHLORIDE 0.9 % IR SOLN
Status: DC | PRN
  Administered 2022-03-10: 6000 mL

## 2022-03-10 MED ORDER — CEFAZOLIN SODIUM-DEXTROSE 2-4 GM/100ML-% IV SOLN
INTRAVENOUS | Status: AC
  Filled 2022-03-10: qty 100

## 2022-03-10 MED ORDER — PHENYLEPHRINE 80 MCG/ML (10ML) SYRINGE FOR IV PUSH (FOR BLOOD PRESSURE SUPPORT)
PREFILLED_SYRINGE | INTRAVENOUS | Status: AC
  Filled 2022-03-10: qty 20

## 2022-03-10 MED ORDER — ONDANSETRON HCL 4 MG/2ML IJ SOLN
INTRAMUSCULAR | Status: DC | PRN
  Administered 2022-03-10: 4 mg via INTRAVENOUS

## 2022-03-10 MED ORDER — ONDANSETRON HCL 4 MG/2ML IJ SOLN: 4.0000 mg | INTRAMUSCULAR | Status: DC | PRN

## 2022-03-10 MED ORDER — ONDANSETRON HCL 4 MG/2ML IJ SOLN
INTRAMUSCULAR | Status: AC
  Filled 2022-03-10: qty 2

## 2022-03-10 MED ORDER — OXYBUTYNIN CHLORIDE 5 MG PO TABS: 5.0000 mg | ORAL_TABLET | Freq: Three times a day (TID) | ORAL | Status: DC | PRN

## 2022-03-10 MED ORDER — MIDAZOLAM HCL 5 MG/5ML IJ SOLN
INTRAMUSCULAR | Status: DC | PRN
  Administered 2022-03-10: 2 mg via INTRAVENOUS

## 2022-03-10 MED ORDER — PHENYLEPHRINE 80 MCG/ML (10ML) SYRINGE FOR IV PUSH (FOR BLOOD PRESSURE SUPPORT)
PREFILLED_SYRINGE | INTRAVENOUS | Status: DC | PRN
  Administered 2022-03-10: 80 ug via INTRAVENOUS

## 2022-03-10 MED ORDER — ACETAMINOPHEN 500 MG PO TABS
1000.0000 mg | ORAL_TABLET | Freq: Once | ORAL | Status: AC
  Administered 2022-03-10: 1000 mg via ORAL

## 2022-03-10 MED ORDER — ACETAMINOPHEN 500 MG PO TABS
ORAL_TABLET | ORAL | Status: AC
  Filled 2022-03-10: qty 2

## 2022-03-10 MED ORDER — OXYCODONE HCL 5 MG PO TABS: 5.0000 mg | ORAL_TABLET | Freq: Once | ORAL | Status: DC | PRN

## 2022-03-10 MED ORDER — LIDOCAINE 2% (20 MG/ML) 5 ML SYRINGE
INTRAMUSCULAR | Status: DC | PRN
  Administered 2022-03-10: 100 mg via INTRAVENOUS

## 2022-03-10 MED ORDER — FENTANYL CITRATE (PF) 100 MCG/2ML IJ SOLN: 25.0000 ug | INTRAMUSCULAR | Status: DC | PRN

## 2022-03-10 MED ORDER — PROPOFOL 10 MG/ML IV BOLUS
INTRAVENOUS | Status: DC | PRN
  Administered 2022-03-10: 200 mg via INTRAVENOUS

## 2022-03-10 MED ORDER — OXYCODONE HCL 5 MG/5ML PO SOLN: 5.0000 mg | Freq: Once | ORAL | Status: DC | PRN

## 2022-03-10 SURGICAL SUPPLY — 34 items
BAG DRAIN URO-CYSTO SKYTR STRL (DRAIN) ×2 IMPLANT
BAG DRN RND TRDRP ANRFLXCHMBR (UROLOGICAL SUPPLIES) ×1
BAG DRN UROCATH (DRAIN) ×1
BAG URINE DRAIN 2000ML AR STRL (UROLOGICAL SUPPLIES) ×2 IMPLANT
BAG URINE LEG 500ML (DRAIN) IMPLANT
CATH FOLEY 2WAY SLVR  5CC 20FR (CATHETERS)
CATH FOLEY 2WAY SLVR  5CC 22FR (CATHETERS)
CATH FOLEY 2WAY SLVR 30CC 22FR (CATHETERS) IMPLANT
CATH FOLEY 2WAY SLVR 5CC 20FR (CATHETERS) IMPLANT
CATH FOLEY 2WAY SLVR 5CC 22FR (CATHETERS) IMPLANT
CATH FOLEY 3WAY 30CC 22FR (CATHETERS) ×1 IMPLANT
CATH HEMA 3WAY 30CC 24FR COUDE (CATHETERS) IMPLANT
CATH HEMA 3WAY 30CC 24FR RND (CATHETERS) IMPLANT
CLOTH BEACON ORANGE TIMEOUT ST (SAFETY) ×2 IMPLANT
ELECT REM PT RETURN 9FT ADLT (ELECTROSURGICAL)
ELECTRODE REM PT RTRN 9FT ADLT (ELECTROSURGICAL) ×1 IMPLANT
EVACUATOR MICROVAS BLADDER (UROLOGICAL SUPPLIES) ×1 IMPLANT
GLOVE BIO SURGEON STRL SZ8 (GLOVE) ×2 IMPLANT
GOWN STRL REUS W/TWL XL LVL3 (GOWN DISPOSABLE) ×2 IMPLANT
HOLDER FOLEY CATH W/STRAP (MISCELLANEOUS) ×1 IMPLANT
IV NS IRRIG 3000ML ARTHROMATIC (IV SOLUTION) ×5 IMPLANT
KIT TURNOVER CYSTO (KITS) ×2 IMPLANT
LOOP CUT BIPOLAR 24F LRG (ELECTROSURGICAL) ×2 IMPLANT
MANIFOLD NEPTUNE II (INSTRUMENTS) ×2 IMPLANT
NS IRRIG 1000ML POUR BTL (IV SOLUTION) ×1 IMPLANT
NS IRRIG 500ML POUR BTL (IV SOLUTION) ×1 IMPLANT
PACK CYSTO (CUSTOM PROCEDURE TRAY) ×2 IMPLANT
PLUG CATH AND CAP STER (CATHETERS) IMPLANT
SYR 30ML LL (SYRINGE) ×1 IMPLANT
SYR TOOMEY IRRIG 70ML (MISCELLANEOUS) ×2
SYRINGE TOOMEY IRRIG 70ML (MISCELLANEOUS) IMPLANT
TUBE CONNECTING 12X1/4 (SUCTIONS) ×2 IMPLANT
TUBING UROLOGY SET (TUBING) ×1 IMPLANT
WATER STERILE IRR 500ML POUR (IV SOLUTION) ×1 IMPLANT

## 2022-03-10 NOTE — Interval H&P Note (Signed)
History and Physical Interval Note:  03/10/2022 10:29 AM  Bruce Adkins  has presented today for surgery, with the diagnosis of BENIGN PROSTATIC HYPERPLASIA.  The various methods of treatment have been discussed with the patient and family. After consideration of risks, benefits and other options for treatment, the patient has consented to  Procedure(s): TRANSURETHRAL RESECTION OF THE PROSTATE (TURP) (N/A) as a surgical intervention.  The patient's history has been reviewed, patient examined, no change in status, stable for surgery.  I have reviewed the patient's chart and labs.  Questions were answered to the patient's satisfaction.     Bertram Millard [name]

## 2022-03-10 NOTE — Anesthesia Procedure Notes (Signed)
Procedure Name: LMA Insertion Date/Time: 03/10/2022 11:31 AM  Performed by: Pearson Grippe, CRNAPre-anesthesia Checklist: Patient identified, Emergency Drugs available, Suction available and Patient being monitored Patient Re-evaluated:Patient Re-evaluated prior to induction Oxygen Delivery Method: Circle system utilized Preoxygenation: Pre-oxygenation with 100% oxygen Induction Type: IV induction Ventilation: Mask ventilation without difficulty LMA: LMA inserted LMA Size: 5.0 Number of attempts: 1 Airway Equipment and Method: Bite block Placement Confirmation: positive ETCO2 Tube secured with: Tape Dental Injury: Teeth and Oropharynx as per pre-operative assessment

## 2022-03-10 NOTE — Transfer of Care (Signed)
Immediate Anesthesia Transfer of Care Note  Patient: Bruce Adkins  Procedure(s) Performed: TRANSURETHRAL RESECTION OF THE PROSTATE (TURP) (Prostate)  Patient Location: PACU  Anesthesia Type:General  Level of Consciousness: drowsy and patient cooperative  Airway & Oxygen Therapy: Patient Spontanous Breathing and Patient connected to face mask oxygen  Post-op Assessment: Report given to RN and Post -op Vital signs reviewed and stable  Post vital signs: Reviewed and stable  Last Vitals:  Vitals Value Taken Time  BP 132/82 03/10/22 1237  Temp    Pulse 74 03/10/22 1239  Resp 11 03/10/22 1239  SpO2 100 % 03/10/22 1239  Vitals shown include unvalidated device data.  Last Pain:  Vitals:   03/10/22 0952  TempSrc: Oral      Patients Stated Pain Goal: 5 (03/10/22 4332)  Complications: No notable events documented.

## 2022-03-10 NOTE — Anesthesia Postprocedure Evaluation (Signed)
Anesthesia Post Note  Patient: Bruce Adkins  Procedure(s) Performed: TRANSURETHRAL RESECTION OF THE PROSTATE (TURP) (Prostate)     Patient location during evaluation: PACU Anesthesia Type: General Level of consciousness: awake and alert Pain management: pain level controlled Vital Signs Assessment: post-procedure vital signs reviewed and stable Respiratory status: spontaneous breathing, nonlabored ventilation and respiratory function stable Cardiovascular status: blood pressure returned to baseline Postop Assessment: no apparent nausea or vomiting Anesthetic complications: no   No notable events documented.  Last Vitals:  Vitals:   03/10/22 1330 03/10/22 1345  BP: 121/76 119/89  Pulse: 68 70  Resp: 17 18  Temp: 36.5 C   SpO2: 97% 99%    Last Pain:  Vitals:   03/10/22 1330  TempSrc:   PainSc: 2                  Shanda Howells

## 2022-03-10 NOTE — Op Note (Signed)
Preoperative diagnosis: Bladder outlet obstruction secondary to BPH  Postoperative diagnosis:  Bladder outlet obstruction secondary to BPH  Procedure:  Cystoscopy Transurethral resection of the prostate  Surgeon: Bertram Millard. Fatou Dunnigan, M.D.  Anesthesia: General  Complications: None  Drain: Foley catheter  EBL: Minimal  Specimens: Prostate chips  Disposition of specimens: Pathology  Indication: Bruce Adkins is a patient with bladder outlet obstruction secondary to benign prostatic hyperplasia. He has BPH w/ a non-emptying bladder despite prior Urolift procedure in 2022. He has an indwelling catheter w/ him using a plug for drainage.After reviewing the management options for treatment, he elected to proceed with the above surgical procedure(s). We have discussed the potential benefits and risks of the procedure, side effects of the proposed treatment, the likelihood of the patient achieving the goals of the procedure, and any potential problems that might occur during the procedure or recuperation. Informed consent has been obtained.  Description of procedure:  The patient was identified in the holding area. He received preoperative antibiotics. He was then taken to the operating room. General anesthetic was administered.  The patient was then placed in the dorsal lithotomy position, prepped and draped in the usual sterile fashion. Timeout was then performed.  A resectoscope sheath was placed using the visual obturator.   The bladder was then systematically examined in its entirety. There was no evidence of  tumors, stones, or other mucosal pathology.The resectoscope, loop and telescope were then placed.  The ureteral orifices were identified so as to be avoided during the procedure.  The prostate adenoma was then resected utilizing loop cautery resection with the bipolar cutting loop.  The prostate adenoma from the bladder neck back to the verumontanum was resected beginning at  the six o'clock position and then extended to include the right and left lobes of the prostate and anterior prostate, respectively. Care was taken not to resect distal to the verumontanum. Several Urolift tabs were encountered during the resection-these were easily removed.  Hemostasis was then achieved with the cautery and the bladder was emptied and reinspected with no significant bleeding noted at the end of the procedure.  Resected chips were irrigated from the bladder with the evacuator and sent to pathology.  A 3 way catheter was then placed into the bladder and placed on continuous bladder irrigation.  The patient appeared to tolerate the procedure well and without complications. The patient was able to be awakened and transferred to the recovery unit in satisfactory condition. He tolerated the procedure well.

## 2022-03-10 NOTE — Anesthesia Preprocedure Evaluation (Addendum)
Anesthesia Evaluation  Patient identified by MRN, date of birth, ID band Patient awake    Reviewed: Allergy & Precautions, NPO status , Patient's Chart, lab work & pertinent test results  History of Anesthesia Complications Negative for: history of anesthetic complications  Airway Mallampati: II  TM Distance: >3 FB Neck ROM: Full    Dental  (+) Missing,    Pulmonary neg pulmonary ROS,    Pulmonary exam normal        Cardiovascular hypertension, Pt. on medications + DVT (2016, on Eliquis)  Normal cardiovascular exam     Neuro/Psych negative neurological ROS  negative psych ROS   GI/Hepatic Neg liver ROS, GERD  ,  Endo/Other  negative endocrine ROS  Renal/GU Renal InsufficiencyRenal disease   BPH    Musculoskeletal  (+) Arthritis ,   Abdominal   Peds  Hematology negative hematology ROS (+)   Anesthesia Other Findings Day of surgery medications reviewed with patient.  Reproductive/Obstetrics negative OB ROS                            Anesthesia Physical Anesthesia Plan  ASA: 2  Anesthesia Plan: General   Post-op Pain Management: Tylenol PO (pre-op)*   Induction: Intravenous  PONV Risk Score and Plan: Treatment may vary due to age or medical condition, Midazolam, Dexamethasone and Ondansetron  Airway Management Planned: LMA  Additional Equipment: None  Intra-op Plan:   Post-operative Plan: Extubation in OR  Informed Consent: I have reviewed the patients History and Physical, chart, labs and discussed the procedure including the risks, benefits and alternatives for the proposed anesthesia with the patient or authorized representative who has indicated his/her understanding and acceptance.     Dental advisory given  Plan Discussed with: CRNA  Anesthesia Plan Comments:        Anesthesia Quick Evaluation

## 2022-03-11 ENCOUNTER — Encounter (HOSPITAL_BASED_OUTPATIENT_CLINIC_OR_DEPARTMENT_OTHER): Payer: Self-pay | Admitting: Urology

## 2022-03-11 DIAGNOSIS — R338 Other retention of urine: Secondary | ICD-10-CM | POA: Diagnosis not present

## 2022-03-11 DIAGNOSIS — Z79899 Other long term (current) drug therapy: Secondary | ICD-10-CM | POA: Diagnosis not present

## 2022-03-11 DIAGNOSIS — I129 Hypertensive chronic kidney disease with stage 1 through stage 4 chronic kidney disease, or unspecified chronic kidney disease: Secondary | ICD-10-CM | POA: Diagnosis not present

## 2022-03-11 DIAGNOSIS — N138 Other obstructive and reflux uropathy: Secondary | ICD-10-CM | POA: Diagnosis not present

## 2022-03-11 DIAGNOSIS — N401 Enlarged prostate with lower urinary tract symptoms: Secondary | ICD-10-CM | POA: Diagnosis not present

## 2022-03-11 DIAGNOSIS — N189 Chronic kidney disease, unspecified: Secondary | ICD-10-CM | POA: Diagnosis not present

## 2022-03-11 LAB — SURGICAL PATHOLOGY

## 2022-03-11 MED ORDER — CEPHALEXIN 500 MG PO CAPS: 500.0000 mg | ORAL_CAPSULE | Freq: Two times a day (BID) | ORAL | 0 refills | Status: AC

## 2022-03-11 NOTE — Plan of Care (Signed)

## 2022-03-11 NOTE — Plan of Care (Signed)
  Problem: Education: Goal: Knowledge of General Education information will improve Description: Including pain rating scale, medication(s)/side effects and non-pharmacologic comfort measures 03/11/2022 1223 by Elizebeth Brooking, RN Outcome: Adequate for Discharge 03/11/2022 1042 by Elizebeth Brooking, RN Outcome: Progressing   Problem: Health Behavior/Discharge Planning: Goal: Ability to manage health-related needs will improve 03/11/2022 1223 by Elizebeth Brooking, RN Outcome: Adequate for Discharge 03/11/2022 1042 by Elizebeth Brooking, RN Outcome: Progressing   Problem: Clinical Measurements: Goal: Ability to maintain clinical measurements within normal limits will improve 03/11/2022 1223 by Elizebeth Brooking, RN Outcome: Adequate for Discharge 03/11/2022 1042 by Elizebeth Brooking, RN Outcome: Progressing Goal: Will remain free from infection 03/11/2022 1223 by Elizebeth Brooking, RN Outcome: Adequate for Discharge 03/11/2022 1042 by Elizebeth Brooking, RN Outcome: Progressing Goal: Diagnostic test results will improve 03/11/2022 1223 by Elizebeth Brooking, RN Outcome: Adequate for Discharge 03/11/2022 1042 by Elizebeth Brooking, RN Outcome: Progressing Goal: Respiratory complications will improve 03/11/2022 1223 by Elizebeth Brooking, RN Outcome: Adequate for Discharge 03/11/2022 1042 by Elizebeth Brooking, RN Outcome: Progressing Goal: Cardiovascular complication will be avoided 03/11/2022 1223 by Elizebeth Brooking, RN Outcome: Adequate for Discharge 03/11/2022 1042 by Elizebeth Brooking, RN Outcome: Progressing   Problem: Activity: Goal: Risk for activity intolerance will decrease 03/11/2022 1223 by Elizebeth Brooking, RN Outcome: Adequate for Discharge 03/11/2022 1042 by Elizebeth Brooking, RN Outcome: Progressing   Problem: Nutrition: Goal: Adequate nutrition will be maintained 03/11/2022 1223 by Elizebeth Brooking,  RN Outcome: Adequate for Discharge 03/11/2022 1042 by Elizebeth Brooking, RN Outcome: Progressing   Problem: Coping: Goal: Level of anxiety will decrease 03/11/2022 1223 by Elizebeth Brooking, RN Outcome: Adequate for Discharge 03/11/2022 1042 by Elizebeth Brooking, RN Outcome: Progressing   Problem: Elimination: Goal: Will not experience complications related to bowel motility 03/11/2022 1223 by Elizebeth Brooking, RN Outcome: Adequate for Discharge 03/11/2022 1042 by Elizebeth Brooking, RN Outcome: Progressing   Problem: Pain Managment: Goal: General experience of comfort will improve 03/11/2022 1223 by Elizebeth Brooking, RN Outcome: Adequate for Discharge 03/11/2022 1042 by Elizebeth Brooking, RN Outcome: Progressing   Problem: Safety: Goal: Ability to remain free from injury will improve 03/11/2022 1223 by Elizebeth Brooking, RN Outcome: Adequate for Discharge 03/11/2022 1042 by Elizebeth Brooking, RN Outcome: Progressing   Problem: Skin Integrity: Goal: Risk for impaired skin integrity will decrease 03/11/2022 1223 by Elizebeth Brooking, RN Outcome: Adequate for Discharge 03/11/2022 1042 by Elizebeth Brooking, RN Outcome: Progressing   Problem: Elimination: Goal: Will not experience complications related to urinary retention Outcome: Completed/Met

## 2022-03-15 ENCOUNTER — Other Ambulatory Visit (HOSPITAL_COMMUNITY): Payer: Self-pay

## 2022-03-15 DIAGNOSIS — N1832 Chronic kidney disease, stage 3b: Secondary | ICD-10-CM | POA: Diagnosis not present

## 2022-03-15 DIAGNOSIS — I129 Hypertensive chronic kidney disease with stage 1 through stage 4 chronic kidney disease, or unspecified chronic kidney disease: Secondary | ICD-10-CM | POA: Diagnosis not present

## 2022-03-15 DIAGNOSIS — Z79899 Other long term (current) drug therapy: Secondary | ICD-10-CM | POA: Diagnosis not present

## 2022-03-15 DIAGNOSIS — Z131 Encounter for screening for diabetes mellitus: Secondary | ICD-10-CM | POA: Diagnosis not present

## 2022-03-15 DIAGNOSIS — R809 Proteinuria, unspecified: Secondary | ICD-10-CM | POA: Diagnosis not present

## 2022-03-15 NOTE — Discharge Summary (Signed)
Date of admission: 03/10/2022  Date of discharge: 03/15/2022  Admission diagnosis: BPH    Discharge diagnosis: BPH  Secondary diagnoses: None  History and Physical: For full details, please see admission history and physical. Briefly, Bruce Adkins is a 66 y.o. male with BPH.   Hospital Course:  The patient underwent transurethral resection of prostate on 6.15.2023. Continuous bladder irrigation was initiated post-operatively. He tolerated the procedure well and was transferred to the floor after receiving routine post-operative care. His diet was gradually advanced, and his pain was controlled with oral analgesics.  By POD1, his urine had cleared on slow drip CBI, so CBI was stopped. His hematuria remained mild with CBI stopped and after ambulation. CBI was disconnected, and the 3rd port of his foley catheter was plugged.   By later on POD1, he was tolerating a regular diet, ambulating, having good urine output with mild hematuria via foley catheter, and having pain well-controlled with oral analgesics. Thus, he was deemed appropriate for discharge home. His foley catheter was continued prior to discharge and he passed a voiding trial.  Laboratory values:  No results for input(s): "HGB", "HCT" in the last 72 hours. No results for input(s): "CREATININE" in the last 72 hours. Physical Exam:  General: Alert and oriented CV: Regular rate Lungs: NWOB on RA Abdomen: Soft, nondistended, nontender Extremities: Warm and well-perfused   Disposition: Home  Discharge instruction: The patient was instructed to be ambulatory but told to refrain from heavy lifting, strenuous activity, or driving while on narcotics.   Discharge medications:  Allergies as of 03/11/2022   No Known Allergies      Medication List     STOP taking these medications    bethanechol 25 MG tablet Commonly known as: Urecholine   Eliquis 5 MG Tabs tablet Generic drug: apixaban   predniSONE 10 MG (48) Tbpk  tablet Commonly known as: STERAPRED UNI-PAK 48 TAB   tamsulosin 0.4 MG Caps capsule Commonly known as: FLOMAX   traMADol 50 MG tablet Commonly known as: ULTRAM       TAKE these medications    acetaminophen 500 MG tablet Commonly known as: TYLENOL Take 1,000 mg by mouth every 6 (six) hours as needed for mild pain.   ALPRAZolam 0.25 MG tablet Commonly known as: XANAX Take 1 tablet (0.25 mg total) by mouth at bedtime. What changed:  when to take this reasons to take this   baclofen 10 MG tablet Commonly known as: LIORESAL Take 1 tablet (10 mg total) by mouth at bedtime as needed for muscle spasms.   famotidine 40 MG tablet Commonly known as: PEPCID TAKE 1 TABLET (40 MG TOTAL) BY MOUTH DAILY. What changed:  how much to take when to take this reasons to take this   lisinopril 5 MG tablet Commonly known as: ZESTRIL Take 1 tablet (5 mg total) by mouth daily.   sodium bicarbonate 650 MG tablet Take 1 tablet (650 mg total) by mouth in the morning, in the evening, and at bedtime.       ASK your doctor about these medications    cephALEXin 500 MG capsule Commonly known as: Keflex Take 1 capsule (500 mg total) by mouth 2 (two) times daily for 6 doses. Ask about: Should I take this medication?          Followup:   Follow-up Information     Marcine Matar, MD Follow up.   Specialty: Urology Why: We will call you to set up follow-up appointment Contact information: [phone] [place] [address] [phone]

## 2022-03-17 ENCOUNTER — Telehealth: Payer: Self-pay

## 2022-03-17 DIAGNOSIS — I129 Hypertensive chronic kidney disease with stage 1 through stage 4 chronic kidney disease, or unspecified chronic kidney disease: Secondary | ICD-10-CM | POA: Diagnosis not present

## 2022-03-17 DIAGNOSIS — N138 Other obstructive and reflux uropathy: Secondary | ICD-10-CM | POA: Diagnosis not present

## 2022-03-17 DIAGNOSIS — N1832 Chronic kidney disease, stage 3b: Secondary | ICD-10-CM | POA: Diagnosis not present

## 2022-03-17 DIAGNOSIS — N2581 Secondary hyperparathyroidism of renal origin: Secondary | ICD-10-CM | POA: Diagnosis not present

## 2022-03-17 DIAGNOSIS — N401 Enlarged prostate with lower urinary tract symptoms: Secondary | ICD-10-CM | POA: Diagnosis not present

## 2022-03-17 DIAGNOSIS — R809 Proteinuria, unspecified: Secondary | ICD-10-CM | POA: Diagnosis not present

## 2022-03-17 DIAGNOSIS — R7303 Prediabetes: Secondary | ICD-10-CM | POA: Diagnosis not present

## 2022-03-17 NOTE — Telephone Encounter (Signed)
Patient unable to void- patient reports he had to go back to [facility] several times a day.  Patient scheduled to come in to see you next Tuesday to discuss issues

## 2022-03-17 NOTE — Telephone Encounter (Signed)
Patient left a voice message 03-17-22  Has to self-cath. Cannot urinate on his own. Procedure is not healing due to having to cath. Having blood in urine.  Please advise.  Call back:  [phone]  Thanks, Rosey Bath

## 2022-03-18 ENCOUNTER — Other Ambulatory Visit: Payer: Self-pay | Admitting: Urology

## 2022-03-18 DIAGNOSIS — R339 Retention of urine, unspecified: Secondary | ICD-10-CM

## 2022-03-18 MED ORDER — BETHANECHOL CHLORIDE 25 MG PO TABS: 25.0000 mg | ORAL_TABLET | Freq: Three times a day (TID) | ORAL | 0 refills | Status: DC

## 2022-03-22 ENCOUNTER — Encounter: Payer: Self-pay | Admitting: Urology

## 2022-03-22 ENCOUNTER — Ambulatory Visit (INDEPENDENT_AMBULATORY_CARE_PROVIDER_SITE_OTHER): Payer: Medicare HMO | Admitting: Urology

## 2022-03-22 ENCOUNTER — Other Ambulatory Visit (HOSPITAL_COMMUNITY): Payer: Self-pay

## 2022-03-22 VITALS — BP 118/79 | HR 93

## 2022-03-22 DIAGNOSIS — Z0001 Encounter for general adult medical examination with abnormal findings: Secondary | ICD-10-CM

## 2022-03-22 DIAGNOSIS — N138 Other obstructive and reflux uropathy: Secondary | ICD-10-CM

## 2022-03-22 DIAGNOSIS — R339 Retention of urine, unspecified: Secondary | ICD-10-CM

## 2022-03-22 DIAGNOSIS — N401 Enlarged prostate with lower urinary tract symptoms: Secondary | ICD-10-CM

## 2022-03-22 MED ORDER — AMOXICILLIN-POT CLAVULANATE 500-125 MG PO TABS: 1.0000 | ORAL_TABLET | Freq: Three times a day (TID) | ORAL | 0 refills | Status: DC

## 2022-03-23 LAB — CBC
Hematocrit: 42.7 % (ref 37.5–51.0)
Hemoglobin: 14.3 g/dL (ref 13.0–17.7)
MCH: 31.3 pg (ref 26.6–33.0)
MCHC: 33.5 g/dL (ref 31.5–35.7)
MCV: 93 fL (ref 79–97)
Platelets: 210 10*3/uL (ref 150–450)
RBC: 4.57 x10E6/uL (ref 4.14–5.80)
RDW: 13 % (ref 11.6–15.4)
WBC: 8.6 10*3/uL (ref 3.4–10.8)

## 2022-03-24 ENCOUNTER — Other Ambulatory Visit (HOSPITAL_COMMUNITY): Payer: Self-pay

## 2022-03-25 LAB — URINE CULTURE

## 2022-03-30 ENCOUNTER — Telehealth: Payer: Self-pay

## 2022-03-30 NOTE — Telephone Encounter (Signed)
Patient aware of results and informed of upcoming apt with MD.

## 2022-03-30 NOTE — Telephone Encounter (Signed)
-----   Message from Sydnee Levans, [place] sent at 03/30/2022 12:17 PM EDT ----- Please let pt know his urine culture indicates Augmentin which he was prescribed should cover the bacteria well. ----- Message ----- From: Gustavus Messing, LPN Sent: 01/28/4919   9:55 AM EDT To: Marcine Matar, MD; #  Patient started on Augmentin

## 2022-04-05 ENCOUNTER — Ambulatory Visit (INDEPENDENT_AMBULATORY_CARE_PROVIDER_SITE_OTHER): Payer: Medicare HMO | Admitting: Urology

## 2022-04-05 ENCOUNTER — Encounter: Payer: Self-pay | Admitting: Urology

## 2022-04-05 DIAGNOSIS — N138 Other obstructive and reflux uropathy: Secondary | ICD-10-CM | POA: Diagnosis not present

## 2022-04-05 DIAGNOSIS — N3000 Acute cystitis without hematuria: Secondary | ICD-10-CM

## 2022-04-05 DIAGNOSIS — R339 Retention of urine, unspecified: Secondary | ICD-10-CM | POA: Diagnosis not present

## 2022-04-05 DIAGNOSIS — N401 Enlarged prostate with lower urinary tract symptoms: Secondary | ICD-10-CM | POA: Diagnosis not present

## 2022-04-05 LAB — URINALYSIS, ROUTINE W REFLEX MICROSCOPIC
Bilirubin, UA: NEGATIVE
Glucose, UA: NEGATIVE
Ketones, UA: NEGATIVE
Nitrite, UA: NEGATIVE
Specific Gravity, UA: 1.02 (ref 1.005–1.030)
Urobilinogen, Ur: 0.2 mg/dL (ref 0.2–1.0)
pH, UA: 5.5 (ref 5.0–7.5)

## 2022-04-05 LAB — MICROSCOPIC EXAMINATION
Renal Epithel, UA: NONE SEEN /hpf
WBC, UA: >30 /hpf — AB (ref 0–5)

## 2022-04-05 LAB — BLADDER SCAN AMB NON-IMAGING: Scan Result: 353

## 2022-04-05 NOTE — Progress Notes (Signed)
post void residual= 353

## 2022-04-05 NOTE — Progress Notes (Signed)
History of Present Illness:   4.26.2022: Initial presentation w/ AUR w/ (B) hydro, resolved with catheter placement. Multiple voiding trial on med Rx unsuccessful.   9.26.2022: Urolift performed (TURP recommended). Prostate volume 47 mL. 7 implants placed.   12.6.2022: He is self catheterizing 3 times a day.  He does have spontaneous voids, usually early in the morning and later in the daytime.  In between, he does not void that much.  No problems with catheterizing, no blood in the urine, no dysuria.  Renal function is stable, followed by Dr. Wolfgang Phoenix.  Follow-up renal ultrasound revealed no residual hydronephrosis.  There was bladder wall thickening.   3.28.2023: Here for recheck.  He still is on self-catheterization on average about 5 times every 24 hours.  No problems with catheterization.  He has had no significantly cloudy urine or blood.  He has rare spontaneous voids but he does have occasional feelings of urgency.   He underwent urodynamics.  There is obstruction.  He has mild to moderate contractility of the bladder.   TURP performed on 6.15.2023.  Pathology benign.  He did not void well postoperatively and did self-catheterization for some time.  6.27.2023: Has not done self-catheterization for about 5 days.  He does have a fair flow.  Is going frequently.  Intermittent hematuria.  He has also been feeling somewhat swimmy headed has had chills recently. PVR volume 360 mL. Enterococcal UTI treated, I recommended that he start urecholine.  7.11.2023: Since his last visit he is still taking the Urecholine.  He started tamsulosin again by himself.  Overall he feels like he is voiding better.  He has no further hematuria but his urine is cloudy at times.  No dysuria.  No fever, no chills.  He is happy with his success/current situation.  Past Medical History:  Diagnosis Date   Arthritis    BPH with obstruction/lower urinary tract symptoms    CKD (chronic kidney disease)    DVT (deep  venous thrombosis) (HCC) 2016   LLE   GERD (gastroesophageal reflux disease)    Hyperlipidemia    Hypertension    Panic attacks     Past Surgical History:  Procedure Laterality Date   BALLOON DILATION  12/30/2020   Procedure: BALLOON DILATION;  Surgeon: Dolores Frame, MD;  [facility];  Service: Gastroenterology;;   BIOPSY  12/30/2020   Procedure: BIOPSY;  Surgeon: Dolores Frame, MD;  Location: [facility];  Service: Gastroenterology;;   COLONOSCOPY     COLONOSCOPY WITH PROPOFOL N/A 12/30/2020   Procedure: COLONOSCOPY WITH PROPOFOL;  Surgeon: Dolores Frame, MD;  [facility]: [facility];  Service: Gastroenterology;  Laterality: N/A;   ESOPHAGOGASTRODUODENOSCOPY (EGD) WITH PROPOFOL N/A 12/30/2020   Procedure: ESOPHAGOGASTRODUODENOSCOPY (EGD) WITH PROPOFOL;  Surgeon: Dolores Frame, MD;  Location: [facility];  Service: Gastroenterology;  Laterality: N/A;  am   TRANSURETHRAL RESECTION OF PROSTATE N/A 03/10/2022   Procedure: TRANSURETHRAL RESECTION OF THE PROSTATE (TURP);  Surgeon: Marcine Matar, MD;  Location: [facility];  Service: Urology;  Laterality: N/A;    Home Medications:  Allergies as of 04/05/2022   No Known Allergies      Medication List        Accurate as of April 05, 2022  7:48 AM. If you have any questions, ask your nurse or doctor.          acetaminophen 500 MG tablet Commonly known as: TYLENOL Take 1,000 mg by mouth every 6 (six)  hours as needed for mild pain.   ALPRAZolam 0.25 MG tablet Commonly known as: XANAX Take 1 tablet (0.25 mg total) by mouth at bedtime. What changed:  when to take this reasons to take this   amoxicillin-clavulanate 500-125 MG tablet Commonly known as: Augmentin Take 1 tablet (500 mg total) by mouth 3 (three) times daily.   baclofen 10 MG tablet Commonly known as: LIORESAL Take 1 tablet (10 mg total) by mouth at bedtime as needed for muscle spasms.    bethanechol 25 MG tablet Commonly known as: Urecholine Take 1 tablet (25 mg total) by mouth 3 (three) times daily.   famotidine 40 MG tablet Commonly known as: PEPCID TAKE 1 TABLET (40 MG TOTAL) BY MOUTH DAILY. What changed:  how much to take when to take this reasons to take this   lisinopril 5 MG tablet Commonly known as: ZESTRIL Take 1 tablet (5 mg total) by mouth daily.   sodium bicarbonate 650 MG tablet Take 1 tablet (650 mg total) by mouth in the morning, in the evening, and at bedtime.        Allergies: No Known Allergies  Family History  Problem Relation Age of Onset   Heart failure Mother    Hypertension Mother    Anxiety disorder Mother    Hypertension Father    Cancer Brother     Social History:  reports that he has never smoked. He has never used smokeless tobacco. He reports that he does not currently use drugs. He reports that he does not drink alcohol.  ROS: A complete review of systems was performed.  All systems are negative except for pertinent findings as noted.  Physical Exam:  Vital signs in last 24 hours: There were no vitals taken for this visit. Constitutional:  Alert and oriented, No acute distress Cardiovascular: Regular rate  Respiratory: Normal respiratory effort Neurologic: Grossly intact, no focal deficits Psychiatric: Normal mood and affect  I have reviewed prior pt notes  I have reviewed urinalysis results-pyuria present  I have independently reviewed bladder scan-residual still above 300 but stable  I have reviewed prior PSA results  I have reviewed prior urine culture   Impression/Assessment:  1.  BPH with retention and associated hydronephrosis/acute/chronic renal insufficiency.  He underwent TURP after failed UroLift.  He seems to be emptying much better and symptomatically is stable  2.  Bilateral hydronephrosis, resolved with proper bladder drainage  3.  Recurrent pyuria  Plan:  1.  He will stop the tamsulosin.   In 1 week I told him to back down to Urecholine twice daily.  In 2 weeks he will back down to once daily and in 3 weeks he will stop the Urecholine  2.  Urine was cultured but no antibiotic administered yet  3.  I will recommend that he take vitamin C 500 mg twice a day  4.  I like to see him back in 6 weeks to recheck

## 2022-04-08 LAB — URINE CULTURE

## 2022-04-11 ENCOUNTER — Other Ambulatory Visit: Payer: Self-pay | Admitting: Urology

## 2022-04-11 ENCOUNTER — Telehealth: Payer: Self-pay

## 2022-04-11 DIAGNOSIS — N3000 Acute cystitis without hematuria: Secondary | ICD-10-CM

## 2022-04-11 MED ORDER — AMOXICILLIN 500 MG PO TABS: 500.0000 mg | ORAL_TABLET | Freq: Two times a day (BID) | ORAL | 0 refills | Status: DC

## 2022-04-11 NOTE — Telephone Encounter (Signed)
Patient aware of results and rx at pharmacy. 

## 2022-04-11 NOTE — Telephone Encounter (Signed)
-----   Message from Marcine Matar, MD sent at 04/11/2022  6:48 AM EDT ----- Let pt know that urine culture was +--I sent abx in ----- Message ----- From: Grier Rocher, CMA Sent: 04/08/2022   3:02 PM EDT To: Marcine Matar, MD  Please review, pt taking Augmentin.

## 2022-04-19 ENCOUNTER — Telehealth: Payer: Self-pay

## 2022-04-19 ENCOUNTER — Other Ambulatory Visit (HOSPITAL_COMMUNITY): Payer: Self-pay

## 2022-04-19 NOTE — Telephone Encounter (Signed)
Okay to refill? 

## 2022-04-19 NOTE — Telephone Encounter (Signed)
Patient called to have his Tadalafil (Cialis) refills sent in to  [facility] [address] [place] - [address] [place] [address] Phone:  [phone]  Fax:  [phone]     Patient is currently out.   Going out of town. Urgent to have filled asap.  Please advise.  Thanks, Rosey Bath

## 2022-04-25 ENCOUNTER — Other Ambulatory Visit: Payer: Self-pay | Admitting: Internal Medicine

## 2022-04-25 ENCOUNTER — Telehealth: Payer: Self-pay

## 2022-04-25 ENCOUNTER — Telehealth: Payer: Self-pay | Admitting: Internal Medicine

## 2022-04-25 DIAGNOSIS — I82409 Acute embolism and thrombosis of unspecified deep veins of unspecified lower extremity: Secondary | ICD-10-CM

## 2022-04-25 MED ORDER — APIXABAN 5 MG PO TABS: 5.0000 mg | ORAL_TABLET | Freq: Two times a day (BID) | ORAL | 5 refills | Status: DC

## 2022-04-25 NOTE — Telephone Encounter (Signed)
Patient called and made aware that he needs to notify the doctor that prescribed Eliquis. Patient voiced understanding and states he meant to call his PCP's office.

## 2022-04-25 NOTE — Telephone Encounter (Signed)
Patient called advising he needed a refill on Eliquis.    Pharmacy: [facility] [address] [place] - [phone] [place] [address]      Thank you

## 2022-04-25 NOTE — Telephone Encounter (Signed)
Pt had surgery & had to stop apixaban (ELIQUIS) 5 MG TABS tablet . He is wanting to know if he can please get this refill? He has been off of this medication for several weeks.     [facility]

## 2022-04-26 NOTE — Telephone Encounter (Signed)
Patient advised with verbal understanding  

## 2022-04-29 ENCOUNTER — Other Ambulatory Visit: Payer: Self-pay

## 2022-04-29 NOTE — Telephone Encounter (Signed)
Patient never been on Tadalafil. What dose and strength?

## 2022-05-04 NOTE — Telephone Encounter (Signed)
Please Advised. Patient never been on Tadalafil, what dose and strength?

## 2022-05-08 ENCOUNTER — Other Ambulatory Visit: Payer: Self-pay | Admitting: Urology

## 2022-05-08 MED ORDER — TADALAFIL 20 MG PO TABS: ORAL_TABLET | ORAL | 11 refills | Status: DC

## 2022-05-10 NOTE — Telephone Encounter (Signed)
Made patient aware that  medication refill Tadalafil was sent to his pharmarcy. Patient voiced understanding

## 2022-05-16 NOTE — Progress Notes (Signed)
History of Present Illness: 4.26.2022: Initial presentation w/ AUR w/ (B) hydro, resolved with catheter placement. Multiple voiding trial on med Rx unsuccessful.   9.26.2022: Urolift performed (TURP recommended). Prostate volume 47 mL. 7 implants placed.   12.6.2022: He is self catheterizing 3 times a day.  He does have spontaneous voids, usually early in the morning and later in the daytime.  In between, he does not void that much.  No problems with catheterizing, no blood in the urine, no dysuria.  Renal function is stable, followed by Dr. Wolfgang Phoenix.  Follow-up renal ultrasound revealed no residual hydronephrosis.  There was bladder wall thickening.   3.28.2023: Here for recheck.  He still is on self-catheterization on average about 5 times every 24 hours.  No problems with catheterization.  He has had no significantly cloudy urine or blood.  He has rare spontaneous voids but he does have occasional feelings of urgency.   He underwent urodynamics.  There is obstruction.  He has mild to moderate contractility of the bladder.   TURP performed on 6.15.2023.  Pathology benign.  He did not void well postoperatively and did self-catheterization for some time.  6.27.2023: Has not done self-catheterization for about 5 days.  He does have a fair flow.  Is going frequently.  Intermittent hematuria.  He has also been feeling somewhat swimmy headed has had chills recently. PVR volume 360 mL. Enterococcal UTI treated, I recommended that he start urecholine.   7.11.2023: Since his last visit he is still taking the Urecholine.  He started tamsulosin again by himself.  Overall he feels like he is voiding better.  He has no further hematuria but his urine is cloudy at times.  No dysuria.  No fever, no chills.  He is happy with his success/current situation.  8.22.2023:  Past Medical History:  Diagnosis Date   Arthritis    BPH with obstruction/lower urinary tract symptoms    CKD (chronic kidney disease)     DVT (deep venous thrombosis) (HCC) 2016   LLE   GERD (gastroesophageal reflux disease)    Hyperlipidemia    Hypertension    Panic attacks     Past Surgical History:  Procedure Laterality Date   BALLOON DILATION  12/30/2020   Procedure: BALLOON DILATION;  Surgeon: Dolores Frame, MD;  Location: [facility];  Service: Gastroenterology;;   BIOPSY  12/30/2020   Procedure: BIOPSY;  Surgeon: Dolores Frame, MD;  [facility]: [facility];  Service: Gastroenterology;;   COLONOSCOPY     COLONOSCOPY WITH PROPOFOL N/A 12/30/2020   Procedure: COLONOSCOPY WITH PROPOFOL;  Surgeon: Dolores Frame, MD;  [facility];  Service: Gastroenterology;  Laterality: N/A;   ESOPHAGOGASTRODUODENOSCOPY (EGD) WITH PROPOFOL N/A 12/30/2020   Procedure: ESOPHAGOGASTRODUODENOSCOPY (EGD) WITH PROPOFOL;  Surgeon: Dolores Frame, MD;  [facility]: [facility];  Service: Gastroenterology;  Laterality: N/A;  am   TRANSURETHRAL RESECTION OF PROSTATE N/A 03/10/2022   Procedure: TRANSURETHRAL RESECTION OF THE PROSTATE (TURP);  Surgeon: Marcine Matar, MD;  Location: [facility];  Service: Urology;  Laterality: N/A;    Home Medications:  Allergies as of 05/17/2022   No Known Allergies      Medication List        Accurate as of May 16, 2022  7:43 PM. If you have any questions, ask your nurse or doctor.          acetaminophen 500 MG tablet Commonly known as: TYLENOL Take 1,000 mg by mouth every 6 (  six) hours as needed for mild pain.   ALPRAZolam 0.25 MG tablet Commonly known as: XANAX Take 1 tablet (0.25 mg total) by mouth at bedtime. What changed:  when to take this reasons to take this   amoxicillin 500 MG tablet Commonly known as: AMOXIL Take 1 tablet (500 mg total) by mouth every 12 (twelve) hours.   apixaban 5 MG Tabs tablet Commonly known as: ELIQUIS Take 1 tablet (5 mg total) by mouth 2 (two) times daily.   baclofen 10 MG  tablet Commonly known as: LIORESAL Take 1 tablet (10 mg total) by mouth at bedtime as needed for muscle spasms.   bethanechol 25 MG tablet Commonly known as: Urecholine Take 1 tablet (25 mg total) by mouth 3 (three) times daily.   famotidine 40 MG tablet Commonly known as: PEPCID TAKE 1 TABLET (40 MG TOTAL) BY MOUTH DAILY. What changed:  how much to take when to take this reasons to take this   lisinopril 5 MG tablet Commonly known as: ZESTRIL Take 1 tablet (5 mg total) by mouth daily.   sodium bicarbonate 650 MG tablet Take 1 tablet (650 mg total) by mouth in the morning, in the evening, and at bedtime.   tadalafil 20 MG tablet Commonly known as: CIALIS 1/2 to 1 tab po prn        Allergies: No Known Allergies  Family History  Problem Relation Age of Onset   Heart failure Mother    Hypertension Mother    Anxiety disorder Mother    Hypertension Father    Cancer Brother     Social History:  reports that he has never smoked. He has never used smokeless tobacco. He reports that he does not currently use drugs. He reports that he does not drink alcohol.  ROS: A complete review of systems was performed.  All systems are negative except for pertinent findings as noted.  Physical Exam:  Vital signs in last 24 hours: There were no vitals taken for this visit. Constitutional:  Alert and oriented, No acute distress Cardiovascular: Regular rate  Respiratory: Normal respiratory effort GI: Abdomen is soft, nontender, nondistended, no abdominal masses. No CVAT.  Genitourinary: Normal male phallus, testes are descended bilaterally and non-tender and without masses, scrotum is normal in appearance without lesions or masses, perineum is normal on inspection. Lymphatic: No lymphadenopathy Neurologic: Grossly intact, no focal deficits Psychiatric: Normal mood and affect  I have reviewed prior pt notes  I have reviewed notes from referring/previous physicians  I have  reviewed urinalysis results  I have independently reviewed prior imaging  I have reviewed prior PSA results  I have reviewed prior urine culture   Impression/Assessment:  ***  Plan:  ***

## 2022-05-17 ENCOUNTER — Ambulatory Visit (INDEPENDENT_AMBULATORY_CARE_PROVIDER_SITE_OTHER): Payer: 59 | Admitting: Urology

## 2022-05-17 ENCOUNTER — Encounter: Payer: Self-pay | Admitting: Urology

## 2022-05-17 VITALS — BP 120/79 | HR 86

## 2022-05-17 DIAGNOSIS — R339 Retention of urine, unspecified: Secondary | ICD-10-CM

## 2022-05-17 DIAGNOSIS — N138 Other obstructive and reflux uropathy: Secondary | ICD-10-CM

## 2022-05-17 DIAGNOSIS — N401 Enlarged prostate with lower urinary tract symptoms: Secondary | ICD-10-CM

## 2022-05-17 LAB — URINALYSIS, ROUTINE W REFLEX MICROSCOPIC
Bilirubin, UA: NEGATIVE
Glucose, UA: NEGATIVE
Ketones, UA: NEGATIVE
Nitrite, UA: NEGATIVE
Protein,UA: NEGATIVE
Specific Gravity, UA: 1.015 (ref 1.005–1.030)
Urobilinogen, Ur: 0.2 mg/dL (ref 0.2–1.0)
pH, UA: 5.5 (ref 5.0–7.5)

## 2022-05-17 LAB — MICROSCOPIC EXAMINATION: Renal Epithel, UA: NONE SEEN /hpf

## 2022-05-17 LAB — BLADDER SCAN AMB NON-IMAGING: Scan Result: 106

## 2022-05-17 MED ORDER — FINASTERIDE 5 MG PO TABS: 5.0000 mg | ORAL_TABLET | Freq: Every day | ORAL | 3 refills | Status: DC

## 2022-05-17 NOTE — Progress Notes (Signed)
post void residual=106 ?

## 2022-06-22 ENCOUNTER — Ambulatory Visit (INDEPENDENT_AMBULATORY_CARE_PROVIDER_SITE_OTHER): Payer: 59 | Admitting: Internal Medicine

## 2022-06-22 ENCOUNTER — Encounter: Payer: Self-pay | Admitting: Internal Medicine

## 2022-06-22 VITALS — BP 138/84 | HR 69 | Resp 18 | Ht 71.0 in | Wt 204.6 lb

## 2022-06-22 DIAGNOSIS — I82409 Acute embolism and thrombosis of unspecified deep veins of unspecified lower extremity: Secondary | ICD-10-CM | POA: Diagnosis not present

## 2022-06-22 DIAGNOSIS — N1832 Chronic kidney disease, stage 3b: Secondary | ICD-10-CM

## 2022-06-22 DIAGNOSIS — M545 Low back pain, unspecified: Secondary | ICD-10-CM

## 2022-06-22 DIAGNOSIS — Z23 Encounter for immunization: Secondary | ICD-10-CM | POA: Diagnosis not present

## 2022-06-22 DIAGNOSIS — I10 Essential (primary) hypertension: Secondary | ICD-10-CM | POA: Diagnosis not present

## 2022-06-22 DIAGNOSIS — G8929 Other chronic pain: Secondary | ICD-10-CM | POA: Diagnosis not present

## 2022-06-22 MED ORDER — BACLOFEN 10 MG PO TABS: 10.0000 mg | ORAL_TABLET | Freq: Every evening | ORAL | 2 refills | Status: DC | PRN

## 2022-06-22 NOTE — Assessment & Plan Note (Signed)
BP Readings from Last 1 Encounters:  06/22/22 138/84   Well-controlled with Lisinopril 5 mg QD, followed by Nephrology Counseled for compliance with the medications Advised DASH diet and moderate exercise/walking, at least 150 mins/week

## 2022-06-22 NOTE — Assessment & Plan Note (Signed)
Likely has referred pain to right side of back and RLQ/flank area Tylenol PRN Unable to take NSAIDs Baclofen PRN for muscle spasms Offered PT, but patient wants to wait for now.

## 2022-06-22 NOTE — Patient Instructions (Signed)
Please continue taking medications as prescribed.  Please continue to follow low salt diet and ambulate as tolerated. 

## 2022-06-22 NOTE — Assessment & Plan Note (Signed)
On Eliquis Follows up with Heme/Onc. 

## 2022-06-22 NOTE — Progress Notes (Signed)
Established Patient Office Visit  Subjective:  Patient ID: Bruce Adkins, male    DOB: 26-Sep-1956  Age: 66 y.o. MRN: [id number]  CC:  Chief Complaint  Patient presents with   Follow-up    Follow up pt still having right side pain never goes away keeps him up at night    HPI Bruce Adkins is a 66 y.o. male with past medical history of recurrent unprovoked DVT, HTN, BPH, HLD, anxiety and seasonal allergies who presents for f/u of his chronic medical conditions.  He also c/o chronic low back pain and has right flank/RLQ pain at times, which is worse with lying flat. He denies any recent injury or heavy lifting. He has thoracic spine X-ray recently, which was negative for any acute pathology.  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 He has tried Baclofen, which helped with his back discomfort.  He prefers to wait for PT for now.  HTN: He has been taking Lisinopril 5 mg now. BP is well-controlled. Takes medications regularly. Patient denies headache, dizziness, chest pain, dyspnea or palpitations.   CKD: Followed by Nephrology. Denies any dysuria or hematuria.   GERD: Takes Pepcid. Denies any dysphagia or odynophagia.   BPH: Had Urolift procedure, but still had urinary retention.  He had TURP in 06/23.  He denies urinary retention, hesitance, resistance now. Denies any dysuria or hematuria currently.  He received flu and PCV20 in the office today.   Past Medical History:  Diagnosis Date   Arthritis    BPH with obstruction/lower urinary tract symptoms    CKD (chronic kidney disease)    DVT (deep venous thrombosis) ([place]) 2016   LLE   GERD (gastroesophageal reflux disease)    Hyperlipidemia    Hypertension    Panic attacks     Past Surgical History:  Procedure Laterality Date   BALLOON DILATION  12/30/2020   Procedure: BALLOON DILATION;  Surgeon: Harvel Quale, MD;  [facility];  Service: Gastroenterology;;   BIOPSY  12/30/2020   Procedure:  BIOPSY;  Surgeon: Harvel Quale, MD;  [facility];  Service: Gastroenterology;;   COLONOSCOPY     COLONOSCOPY WITH PROPOFOL N/A 12/30/2020   Procedure: COLONOSCOPY WITH PROPOFOL;  Surgeon: Harvel Quale, MD;  [facility];  Service: Gastroenterology;  Laterality: N/A;   ESOPHAGOGASTRODUODENOSCOPY (EGD) WITH PROPOFOL N/A 12/30/2020   Procedure: ESOPHAGOGASTRODUODENOSCOPY (EGD) WITH PROPOFOL;  Surgeon: Harvel Quale, MD;  [facility];  Service: Gastroenterology;  Laterality: N/A;  am   TRANSURETHRAL RESECTION OF PROSTATE N/A 03/10/2022   Procedure: TRANSURETHRAL RESECTION OF THE PROSTATE (TURP);  Surgeon: Franchot Gallo, MD;  Location: [facility];  Service: Urology;  Laterality: N/A;    Family History  Problem Relation Age of Onset   Heart failure Mother    Hypertension Mother    Anxiety disorder Mother    Hypertension Father    Cancer Brother     Social History   Socioeconomic History   Marital status: Married    Spouse name: Britain Saber   Number of children: 3   Years of education: Not on file   Highest education level: Not on file  Occupational History   Not on file  Tobacco Use   Smoking status: Never   Smokeless tobacco: Never  Vaping Use   Vaping Use: Never used  Substance and Sexual Activity   Alcohol use: No   Drug use: Not Currently   Sexual  activity: Yes  Other Topics Concern   Not on file  Social History Narrative   Not on file   Social Determinants of Health   Financial Resource Strain: Low Risk  (09/08/2020)   Overall Financial Resource Strain (CARDIA)    Difficulty of Paying Living Expenses: Not hard at all  Food Insecurity: No Food Insecurity (09/08/2020)   Hunger Vital Sign    Worried About Running Out of Food in the Last Year: Never true    Ran Out of Food in the Last Year: Never true  Transportation Needs: No Transportation Needs (09/08/2020)   PRAPARE -  Hydrologist (Medical): No    Lack of Transportation (Non-Medical): No  Physical Activity: Insufficiently Active (09/08/2020)   Exercise Vital Sign    Days of Exercise per Week: 3 days    Minutes of Exercise per Session: 20 min  Stress: Stress Concern Present (09/08/2020)   [place]    Feeling of Stress : Very much  Social Connections: Moderately Integrated (09/08/2020)   Social Connection and Isolation Panel [NHANES]    Frequency of Communication with Friends and Family: More than three times a week    Frequency of Social Gatherings with Friends and Family: Once a week    Attends Religious Services: More than 4 times per year    Active Member of [employer] or Organizations: No    Attends Archivist Meetings: Never    Marital Status: Married  Human resources officer Violence: Not At Risk (09/08/2020)   Humiliation, Afraid, Rape, and Kick questionnaire    Fear of Current or Ex-Partner: No    Emotionally Abused: No    Physically Abused: No    Sexually Abused: No    Outpatient Medications Prior to Visit  Medication Sig Dispense Refill   acetaminophen (TYLENOL) 500 MG tablet Take 1,000 mg by mouth every 6 (six) hours as needed for mild pain.     ALPRAZolam (XANAX) 0.25 MG tablet Take 1 tablet (0.25 mg total) by mouth at bedtime. (Patient taking differently: Take 0.25 mg by mouth daily as needed for anxiety.) 30 tablet 1   apixaban (ELIQUIS) 5 MG TABS tablet Take 1 tablet (5 mg total) by mouth 2 (two) times daily. 60 tablet 5   bethanechol (URECHOLINE) 25 MG tablet Take 1 tablet (25 mg total) by mouth 3 (three) times daily. (Patient not taking: Reported on 06/22/2022) 45 tablet 0   finasteride (PROSCAR) 5 MG tablet Take 1 tablet (5 mg total) by mouth daily. 90 tablet 3   lisinopril (ZESTRIL) 5 MG tablet Take 1 tablet (5 mg total) by mouth daily. 90 tablet 3   sodium bicarbonate 650 MG tablet Take  1 tablet (650 mg total) by mouth in the morning, in the evening, and at bedtime. 90 tablet 2   tadalafil (CIALIS) 20 MG tablet 1/2 to 1 tab po prn 30 tablet 11   baclofen (LIORESAL) 10 MG tablet Take 1 tablet (10 mg total) by mouth at bedtime as needed for muscle spasms. 30 each 2   famotidine (PEPCID) 40 MG tablet TAKE 1 TABLET (40 MG TOTAL) BY MOUTH DAILY. (Patient taking differently: Take 40 mg by mouth daily as needed for heartburn or indigestion.) 30 tablet 3   amoxicillin (AMOXIL) 500 MG tablet Take 1 tablet (500 mg total) by mouth every 12 (twelve) hours. (Patient not taking: Reported on 06/22/2022) 10 tablet 0   No facility-administered medications prior to  visit.    No Known Allergies  ROS Review of Systems  Constitutional:  Negative for chills and fever.  HENT:  Negative for congestion and sore throat.   Eyes:  Negative for pain and discharge.  Respiratory:  Negative for cough and shortness of breath.   Cardiovascular:  Negative for chest pain and palpitations.  Gastrointestinal:  Negative for constipation, diarrhea and vomiting.  Endocrine: Negative for polydipsia and polyuria.  Genitourinary:  Negative for dysuria and hematuria.  Musculoskeletal:  Positive for arthralgias (L shoulder) and back pain. Negative for neck pain and neck stiffness.  Skin:  Negative for rash.  Neurological:  Negative for dizziness, weakness, numbness and headaches.  Psychiatric/Behavioral:  Negative for agitation and behavioral problems.       Objective:    Physical Exam Vitals reviewed.  Constitutional:      General: He is not in acute distress.    Appearance: He is not diaphoretic.  HENT:     Head: Normocephalic and atraumatic.     Nose: Nose normal.     Mouth/Throat:     Mouth: Mucous membranes are moist.  Eyes:     General: No scleral icterus.    Extraocular Movements: Extraocular movements intact.  Cardiovascular:     Rate and Rhythm: Normal rate and regular rhythm.     Pulses:  Normal pulses.     Heart sounds: Normal heart sounds. No murmur heard. Pulmonary:     Breath sounds: Normal breath sounds. No wheezing or rales.  Musculoskeletal:        General: No tenderness (Lumbar spine).     Cervical back: Neck supple. No tenderness.     Right lower leg: No edema.     Left lower leg: No edema.     Comments: ROM limited over left shoulder  Skin:    General: Skin is warm.     Findings: No rash.  Neurological:     General: No focal deficit present.     Mental Status: He is alert and oriented to person, place, and time.     Cranial Nerves: No cranial nerve deficit.     Sensory: No sensory deficit.  Psychiatric:        Mood and Affect: Mood normal.        Behavior: Behavior normal.     BP 138/84 (BP Location: Right Arm, Patient Position: Sitting, Cuff Size: Normal)   Pulse 69   Resp 18   Ht 5' 11"  (1.803 m)   Wt 204 lb 9.6 oz (92.8 kg)   SpO2 97%   BMI 28.54 kg/m  Wt Readings from Last 3 Encounters:  06/22/22 204 lb 9.6 oz (92.8 kg)  03/10/22 195 lb (88.5 kg)  01/31/22 199 lb 12.8 oz (90.6 kg)    Lab Results  Component Value Date   TSH 0.313 (L) 01/31/2022   Lab Results  Component Value Date   WBC 8.6 03/22/2022   HGB 14.3 03/22/2022   HCT 42.7 03/22/2022   MCV 93 03/22/2022   PLT 210 03/22/2022   Lab Results  Component Value Date   NA 142 03/10/2022   K 3.9 03/10/2022   CO2 25 01/31/2022   GLUCOSE 106 (H) 03/10/2022   BUN 17 03/10/2022   CREATININE 1.70 (H) 03/10/2022   BILITOT 0.5 01/31/2022   ALKPHOS 71 01/31/2022   AST 11 01/31/2022   ALT 15 01/31/2022   PROT 6.9 01/31/2022   ALBUMIN 4.4 01/31/2022   CALCIUM 9.4 01/31/2022   ANIONGAP 8  06/13/2021   EGFR 44 (L) 01/31/2022   Lab Results  Component Value Date   CHOL 202 (H) 01/31/2022   Lab Results  Component Value Date   HDL 67 01/31/2022   Lab Results  Component Value Date   LDLCALC 124 (H) 01/31/2022   Lab Results  Component Value Date   TRIG 62 01/31/2022   Lab  Results  Component Value Date   CHOLHDL 3.0 01/31/2022   Lab Results  Component Value Date   HGBA1C 6.0 (H) 01/31/2022      Assessment & Plan:   Problem List Items Addressed This Visit       Cardiovascular and Mediastinum   Essential hypertension    BP Readings from Last 1 Encounters:  06/22/22 138/84  Well-controlled with Lisinopril 5 mg QD, followed by Nephrology Counseled for compliance with the medications Advised DASH diet and moderate exercise/walking, at least 150 mins/week       Recurrent deep vein thrombosis (DVT) ([place])    On Eliquis Follows up with Heme/Onc.        Genitourinary   Stage 3b chronic kidney disease ([place])    Last BMP reviewed, GFR improved to 49 in it Followed by Nephrology Advised to avoid oral NSAIDs On Lisinopril On Sod Bicarb tablets        Other   Chronic low back pain without sciatica    Likely has referred pain to right side of back and RLQ/flank area Tylenol PRN Unable to take NSAIDs Baclofen PRN for muscle spasms Offered PT, but patient wants to wait for now.      Relevant Medications   baclofen (LIORESAL) 10 MG tablet   Other Visit Diagnoses     Need for immunization against influenza    -  Primary   Relevant Orders   Flu Vaccine QUAD High Dose(Fluad) (Completed)   Need for pneumococcal 20-valent conjugate vaccination       Relevant Orders   Pneumococcal conjugate vaccine 20-valent (Prevnar 20) (Completed)       Meds ordered this encounter  Medications   baclofen (LIORESAL) 10 MG tablet    Sig: Take 1 tablet (10 mg total) by mouth at bedtime as needed for muscle spasms.    Dispense:  30 each    Refill:  2    Follow-up: Return in about 6 months (around 12/21/2022) for HTN and CKD.    Lindell Spar, MD

## 2022-06-22 NOTE — Assessment & Plan Note (Signed)
Last BMP reviewed, GFR improved to 49 in it Followed by Nephrology Advised to avoid oral NSAIDs On Lisinopril On Sod Bicarb tablets

## 2022-07-27 ENCOUNTER — Ambulatory Visit: Payer: Medicare HMO

## 2022-07-29 DIAGNOSIS — H524 Presbyopia: Secondary | ICD-10-CM | POA: Diagnosis not present

## 2022-07-29 DIAGNOSIS — H52209 Unspecified astigmatism, unspecified eye: Secondary | ICD-10-CM | POA: Diagnosis not present

## 2022-07-29 DIAGNOSIS — H5213 Myopia, bilateral: Secondary | ICD-10-CM | POA: Diagnosis not present

## 2022-08-03 ENCOUNTER — Ambulatory Visit: Payer: Medicare HMO | Admitting: Internal Medicine

## 2022-08-15 NOTE — Progress Notes (Signed)
History of Present Illness:  4.26.2022: Initial presentation w/ AUR w/ (B) hydro, resolved with catheter placement. Multiple voiding trial on med Rx unsuccessful.   9.26.2022: Urolift performed (TURP recommended). Prostate volume 47 mL. 7 implants placed.   12.6.2022: He is self catheterizing 3 times a day.  He does have spontaneous voids, usually early in the morning and later in the daytime.  In between, he does not void that much.  No problems with catheterizing, no blood in the urine, no dysuria.  Renal function is stable, followed by Dr. Wolfgang Phoenix.  Follow-up renal ultrasound revealed no residual hydronephrosis.  There was bladder wall thickening.   3.28.2023: Here for recheck.  He still is on self-catheterization on average about 5 times every 24 hours.  No problems with catheterization.  He has had no significantly cloudy urine or blood.  He has rare spontaneous voids but he does have occasional feelings of urgency.   He underwent urodynamics.  There is obstruction.  He has mild to moderate contractility of the bladder.   TURP performed on 6.15.2023.  Pathology benign.  He did not void well postoperatively and did self-catheterization for some time.   6.27.2023: Has not done self-catheterization for about 5 days.  He does have a fair flow.  Is going frequently.  Intermittent hematuria.  He has also been feeling somewhat swimmy headed has had chills recently. PVR volume 360 mL. Enterococcal UTI treated, I recommended that he start urecholine.   7.11.2023: Since his last visit he is still taking the Urecholine.  He started tamsulosin again by himself.  Overall he feels like he is voiding better.  He has no further hematuria but his urine is cloudy at times.  No dysuria.  No fever, no chills.  He is happy with his success/current situation.  He was told to stop bethanechol and and vitamin C 500 mg twice a day.   8.22.2023: He has been on vitamin C and has come off of all urologic medications.  He  is voiding better and is quite happy with his situation except for gross hematuria.   IPSS 7, quality-of-life score 3.   His hematuria comes after intercourse.  He does have retrograde ejaculation.  Also does pass some blood after vigorous activity.  No dysuria.   Past Medical History:  Diagnosis Date   Arthritis    BPH with obstruction/lower urinary tract symptoms    CKD (chronic kidney disease)    DVT (deep venous thrombosis) ([facility]) 2016   LLE   GERD (gastroesophageal reflux disease)    Hyperlipidemia    Hypertension    Panic attacks     Past Surgical History:  Procedure Laterality Date   BALLOON DILATION  12/30/2020   Procedure: BALLOON DILATION;  Surgeon: Dolores Frame, MD;  [facility];  Service: Gastroenterology;;   BIOPSY  12/30/2020   Procedure: BIOPSY;  Surgeon: Dolores Frame, MD;  [facility];  Service: Gastroenterology;;   COLONOSCOPY     COLONOSCOPY WITH PROPOFOL N/A 12/30/2020   Procedure: COLONOSCOPY WITH PROPOFOL;  Surgeon: Dolores Frame, MD;  [facility];  Service: Gastroenterology;  Laterality: N/A;   ESOPHAGOGASTRODUODENOSCOPY (EGD) WITH PROPOFOL N/A 12/30/2020   Procedure: ESOPHAGOGASTRODUODENOSCOPY (EGD) WITH PROPOFOL;  Surgeon: Dolores Frame, MD;  [facility];  Service: Gastroenterology;  Laterality: N/A;  am   TRANSURETHRAL RESECTION OF PROSTATE N/A 03/10/2022   Procedure: TRANSURETHRAL RESECTION OF THE PROSTATE (TURP);  Surgeon: Marcine Matar, MD;  Location:  [facility];  Service: Urology;  Laterality: N/A;    Home Medications:  Allergies as of 08/16/2022   No Known Allergies      Medication List        Accurate as of August 15, 2022  7:28 PM. If you have any questions, ask your nurse or doctor.          acetaminophen 500 MG tablet Commonly known as: TYLENOL Take 1,000 mg by mouth every 6 (six) hours as needed for mild pain.    ALPRAZolam 0.25 MG tablet Commonly known as: XANAX Take 1 tablet (0.25 mg total) by mouth at bedtime. What changed:  when to take this reasons to take this   apixaban 5 MG Tabs tablet Commonly known as: ELIQUIS Take 1 tablet (5 mg total) by mouth 2 (two) times daily.   baclofen 10 MG tablet Commonly known as: LIORESAL Take 1 tablet (10 mg total) by mouth at bedtime as needed for muscle spasms.   bethanechol 25 MG tablet Commonly known as: Urecholine Take 1 tablet (25 mg total) by mouth 3 (three) times daily.   famotidine 40 MG tablet Commonly known as: PEPCID TAKE 1 TABLET (40 MG TOTAL) BY MOUTH DAILY. What changed:  how much to take when to take this reasons to take this   finasteride 5 MG tablet Commonly known as: PROSCAR Take 1 tablet (5 mg total) by mouth daily.   lisinopril 5 MG tablet Commonly known as: ZESTRIL Take 1 tablet (5 mg total) by mouth daily.   sodium bicarbonate 650 MG tablet Take 1 tablet (650 mg total) by mouth in the morning, in the evening, and at bedtime.   tadalafil 20 MG tablet Commonly known as: CIALIS 1/2 to 1 tab po prn        Allergies: No Known Allergies  Family History  Problem Relation Age of Onset   Heart failure Mother    Hypertension Mother    Anxiety disorder Mother    Hypertension Father    Cancer Brother     Social History:  reports that he has never smoked. He has never used smokeless tobacco. He reports that he does not currently use drugs. He reports that he does not drink alcohol.  ROS: A complete review of systems was performed.  All systems are negative except for pertinent findings as noted.  Physical Exam:  Vital signs in last 24 hours: There were no vitals taken for this visit. Constitutional:  Alert and oriented, No acute distress Cardiovascular: Regular rate  Respiratory: Normal respiratory effort GI: Abdomen is soft, nontender, nondistended, no abdominal masses. No CVAT.  Genitourinary: Normal  male phallus, testes are descended bilaterally and non-tender and without masses, scrotum is normal in appearance without lesions or masses, perineum is normal on inspection. Lymphatic: No lymphadenopathy Neurologic: Grossly intact, no focal deficits Psychiatric: Normal mood and affect  I have reviewed prior pt notes  I have reviewed notes from referring/previous physicians  I have reviewed urinalysis results  I have independently reviewed prior imaging  I have reviewed prior PSA results  I have reviewed prior urine culture   Impression/Assessment:  ***  Plan:  ***

## 2022-08-16 ENCOUNTER — Ambulatory Visit (INDEPENDENT_AMBULATORY_CARE_PROVIDER_SITE_OTHER): Payer: Medicare HMO | Admitting: Urology

## 2022-08-16 VITALS — BP 138/87 | HR 82 | Ht 71.0 in | Wt 202.0 lb

## 2022-08-16 DIAGNOSIS — N138 Other obstructive and reflux uropathy: Secondary | ICD-10-CM

## 2022-08-16 DIAGNOSIS — Z87440 Personal history of urinary (tract) infections: Secondary | ICD-10-CM

## 2022-08-16 DIAGNOSIS — R339 Retention of urine, unspecified: Secondary | ICD-10-CM | POA: Diagnosis not present

## 2022-08-16 DIAGNOSIS — N401 Enlarged prostate with lower urinary tract symptoms: Secondary | ICD-10-CM | POA: Diagnosis not present

## 2022-08-16 DIAGNOSIS — N3000 Acute cystitis without hematuria: Secondary | ICD-10-CM

## 2022-08-17 LAB — URINALYSIS, ROUTINE W REFLEX MICROSCOPIC
Bilirubin, UA: NEGATIVE
Glucose, UA: NEGATIVE
Ketones, UA: NEGATIVE
Leukocytes,UA: NEGATIVE
Nitrite, UA: NEGATIVE
Protein,UA: NEGATIVE
RBC, UA: NEGATIVE
Specific Gravity, UA: 1.015 (ref 1.005–1.030)
Urobilinogen, Ur: 0.2 mg/dL (ref 0.2–1.0)
pH, UA: 6 (ref 5.0–7.5)

## 2022-08-24 ENCOUNTER — Ambulatory Visit (INDEPENDENT_AMBULATORY_CARE_PROVIDER_SITE_OTHER): Payer: 59 | Admitting: Internal Medicine

## 2022-08-24 ENCOUNTER — Encounter: Payer: Self-pay | Admitting: Internal Medicine

## 2022-08-24 VITALS — BP 131/79 | HR 77 | Ht 71.0 in | Wt 211.4 lb

## 2022-08-24 DIAGNOSIS — I10 Essential (primary) hypertension: Secondary | ICD-10-CM

## 2022-08-24 DIAGNOSIS — R053 Chronic cough: Secondary | ICD-10-CM | POA: Diagnosis not present

## 2022-08-24 DIAGNOSIS — R809 Proteinuria, unspecified: Secondary | ICD-10-CM | POA: Diagnosis not present

## 2022-08-24 DIAGNOSIS — J302 Other seasonal allergic rhinitis: Secondary | ICD-10-CM

## 2022-08-24 MED ORDER — LOSARTAN POTASSIUM 25 MG PO TABS: 25.0000 mg | ORAL_TABLET | Freq: Every day | ORAL | 1 refills | Status: DC

## 2022-08-24 MED ORDER — FLUTICASONE PROPIONATE 50 MCG/ACT NA SUSP: 2.0000 | Freq: Every day | NASAL | 6 refills | Status: DC

## 2022-08-24 NOTE — Patient Instructions (Signed)
Please start taking Losartan instead of Lisinopril.  Please use Flonase for allergies. Okay to take Zyrtec or Allegra for allergies.  Okay to use humidifier at nighttime to avoid dry air.

## 2022-08-24 NOTE — Assessment & Plan Note (Signed)
Started Flonase Allegra PRN

## 2022-08-24 NOTE — Assessment & Plan Note (Signed)
BP Readings from Last 1 Encounters:  08/24/22 131/79   Well-controlled with Lisinopril 5 mg QD, followed by Nephrology Switched from lisinopril to losartan 25 mg daily as he has dry cough, likely due to lisinopril Counseled for compliance with the medications Advised DASH diet and moderate exercise/walking, at least 150 mins/week

## 2022-08-24 NOTE — Progress Notes (Signed)
Acute Office Visit  Subjective:    Patient ID: Bruce Adkins, male    DOB: 1956/04/27, 66 y.o.   MRN: [id number]  Chief Complaint  Patient presents with   Cough    Cough x 6 months dry cough    HPI Patient is in today for complaint of cough for the last 6 months.  He reports mostly dry cough, but denies any dyspnea or wheezing.  Denies any fever or chills.  He has chronic nasal congestion and postnasal drip.  He has been using OTC antihistaminic with some relief.  Of note, he has been taking lisinopril for HTN and nonnephrotic range proteinuria.  Past Medical History:  Diagnosis Date   Arthritis    BPH with obstruction/lower urinary tract symptoms    CKD (chronic kidney disease)    DVT (deep venous thrombosis) ([facility]) 2016   LLE   GERD (gastroesophageal reflux disease)    Hyperlipidemia    Hypertension    Panic attacks     Past Surgical History:  Procedure Laterality Date   BALLOON DILATION  12/30/2020   Procedure: BALLOON DILATION;  Surgeon: Dolores Frame, MD;  [facility];  Service: Gastroenterology;;   BIOPSY  12/30/2020   Procedure: BIOPSY;  Surgeon: Dolores Frame, MD;  [facility]: [facility];  Service: Gastroenterology;;   COLONOSCOPY     COLONOSCOPY WITH PROPOFOL N/A 12/30/2020   Procedure: COLONOSCOPY WITH PROPOFOL;  Surgeon: Dolores Frame, MD;  [facility];  Service: Gastroenterology;  Laterality: N/A;   ESOPHAGOGASTRODUODENOSCOPY (EGD) WITH PROPOFOL N/A 12/30/2020   Procedure: ESOPHAGOGASTRODUODENOSCOPY (EGD) WITH PROPOFOL;  Surgeon: Dolores Frame, MD;  [facility];  Service: Gastroenterology;  Laterality: N/A;  am   TRANSURETHRAL RESECTION OF PROSTATE N/A 03/10/2022   Procedure: TRANSURETHRAL RESECTION OF THE PROSTATE (TURP);  Surgeon: Marcine Matar, MD;  Location[facility];  Service: Urology;  Laterality: N/A;    Family History  Problem Relation Age of Onset    Heart failure Mother    Hypertension Mother    Anxiety disorder Mother    Hypertension Father    Cancer Brother     Social History   Socioeconomic History   Marital status: Married    Spouse name: Oseas Detty   Number of children: 3   Years of education: Not on file   Highest education level: Not on file  Occupational History   Not on file  Tobacco Use   Smoking status: Never   Smokeless tobacco: Never  Vaping Use   Vaping Use: Never used  Substance and Sexual Activity   Alcohol use: No   Drug use: Not Currently   Sexual activity: Yes  Other Topics Concern   Not on file  Social History Narrative   Not on file   Social Determinants of Health   Financial Resource Strain: Low Risk  (09/08/2020)   Overall Financial Resource Strain (CARDIA)    Difficulty of Paying Living Expenses: Not hard at all  Food Insecurity: No Food Insecurity (09/08/2020)   Hunger Vital Sign    Worried About Running Out of Food in the Last Year: Never true    Ran Out of Food in the Last Year: Never true  Transportation Needs: No Transportation Needs (09/08/2020)   PRAPARE - Administrator, Civil Service (Medical): No    Lack of Transportation (Non-Medical): No  Physical Activity: Insufficiently Active (09/08/2020)   Exercise Vital Sign    Days  of Exercise per Week: 3 days    Minutes of Exercise per Session: 20 min  Stress: Stress Concern Present (09/08/2020)   [employer] of Occupational Health - Occupational Stress Questionnaire    Feeling of Stress : Very much  Social Connections: Moderately Integrated (09/08/2020)   Social Connection and Isolation Panel [NHANES]    Frequency of Communication with Friends and Family: More than three times a week    Frequency of Social Gatherings with Friends and Family: Once a week    Attends Religious Services: More than 4 times per year    Active Member of [employer] or Organizations: No    Attends Banker Meetings: Never     Marital Status: Married  Catering manager Violence: Not At Risk (09/08/2020)   Humiliation, Afraid, Rape, and Kick questionnaire    Fear of Current or Ex-Partner: No    Emotionally Abused: No    Physically Abused: No    Sexually Abused: No    Outpatient Medications Prior to Visit  Medication Sig Dispense Refill   acetaminophen (TYLENOL) 500 MG tablet Take 1,000 mg by mouth every 6 (six) hours as needed for mild pain.     ALPRAZolam (XANAX) 0.25 MG tablet Take 1 tablet (0.25 mg total) by mouth at bedtime. (Patient taking differently: Take 0.25 mg by mouth daily as needed for anxiety.) 30 tablet 1   apixaban (ELIQUIS) 5 MG TABS tablet Take 1 tablet (5 mg total) by mouth 2 (two) times daily. 60 tablet 5   baclofen (LIORESAL) 10 MG tablet Take 1 tablet (10 mg total) by mouth at bedtime as needed for muscle spasms. 30 each 2   bethanechol (URECHOLINE) 25 MG tablet Take 1 tablet (25 mg total) by mouth 3 (three) times daily. 45 tablet 0   finasteride (PROSCAR) 5 MG tablet Take 1 tablet (5 mg total) by mouth daily. 90 tablet 3   sodium bicarbonate 650 MG tablet Take 1 tablet (650 mg total) by mouth in the morning, in the evening, and at bedtime. 90 tablet 2   tadalafil (CIALIS) 20 MG tablet 1/2 to 1 tab po prn 30 tablet 11   lisinopril (ZESTRIL) 5 MG tablet Take 1 tablet (5 mg total) by mouth daily. 90 tablet 3   famotidine (PEPCID) 40 MG tablet TAKE 1 TABLET (40 MG TOTAL) BY MOUTH DAILY. (Patient taking differently: Take 40 mg by mouth daily as needed for heartburn or indigestion.) 30 tablet 3   No facility-administered medications prior to visit.    No Known Allergies  Review of Systems  Constitutional:  Negative for chills and fever.  HENT:  Positive for congestion and postnasal drip. Negative for sore throat.   Eyes:  Negative for pain and discharge.  Respiratory:  Positive for cough. Negative for shortness of breath.   Cardiovascular:  Negative for chest pain and palpitations.   Gastrointestinal:  Negative for constipation, diarrhea and vomiting.  Endocrine: Negative for polydipsia and polyuria.  Genitourinary:  Negative for dysuria and hematuria.  Musculoskeletal:  Positive for arthralgias (L shoulder) and back pain. Negative for neck pain and neck stiffness.  Skin:  Negative for rash.  Neurological:  Negative for dizziness, weakness, numbness and headaches.  Psychiatric/Behavioral:  Negative for agitation and behavioral problems.        Objective:    Physical Exam Vitals reviewed.  Constitutional:      General: He is not in acute distress.    Appearance: He is not diaphoretic.  HENT:  Head: Normocephalic and atraumatic.     Nose: Nose normal.     Mouth/Throat:     Mouth: Mucous membranes are moist.  Eyes:     General: No scleral icterus.    Extraocular Movements: Extraocular movements intact.  Cardiovascular:     Rate and Rhythm: Normal rate and regular rhythm.     Pulses: Normal pulses.     Heart sounds: Normal heart sounds. No murmur heard. Pulmonary:     Breath sounds: Normal breath sounds. No wheezing or rales.  Musculoskeletal:     Cervical back: Neck supple. No tenderness.     Right lower leg: No edema.     Left lower leg: No edema.  Skin:    General: Skin is warm.     Findings: No rash.  Neurological:     General: No focal deficit present.     Mental Status: He is alert and oriented to person, place, and time.     Cranial Nerves: No cranial nerve deficit.     Sensory: No sensory deficit.  Psychiatric:        Mood and Affect: Mood normal.        Behavior: Behavior normal.     BP 131/79 (BP Location: Left Arm, Patient Position: Sitting, Cuff Size: Large)   Pulse 77   Ht 5\' 11"  (1.803 m)   Wt 211 lb 6.4 oz (95.9 kg)   SpO2 98%   BMI 29.48 kg/m  Wt Readings from Last 3 Encounters:  08/24/22 211 lb 6.4 oz (95.9 kg)  08/16/22 202 lb (91.6 kg)  06/22/22 204 lb 9.6 oz (92.8 kg)        Assessment & Plan:   Problem List  Items Addressed This Visit       Cardiovascular and Mediastinum   Essential hypertension    BP Readings from Last 1 Encounters:  08/24/22 131/79  Well-controlled with Lisinopril 5 mg QD, followed by Nephrology Switched from lisinopril to losartan 25 mg daily as he has dry cough, likely due to lisinopril Counseled for compliance with the medications Advised DASH diet and moderate exercise/walking, at least 150 mins/week      Relevant Medications   losartan (COZAAR) 25 MG tablet     Respiratory   Allergic rhinitis    Started Flonase Allegra PRN      Relevant Medications   fluticasone (FLONASE) 50 MCG/ACT nasal spray     Other   Chronic cough - Primary    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    Dispense:  90 tablet    Refill:  1   fluticasone (FLONASE) 50 MCG/ACT nasal spray    Sig: Place 2 sprays into both nostrils daily.    Dispense:  16 g    Refill:  6     Brigitte Soderberg 08/26/22, MD

## 2022-08-24 NOTE — Assessment & Plan Note (Signed)
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 

## 2022-08-24 NOTE — Assessment & Plan Note (Addendum)
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

## 2022-08-29 ENCOUNTER — Other Ambulatory Visit: Payer: Self-pay | Admitting: Internal Medicine

## 2022-08-29 DIAGNOSIS — F419 Anxiety disorder, unspecified: Secondary | ICD-10-CM

## 2022-08-30 MED ORDER — ALPRAZOLAM 0.25 MG PO TABS: 0.2500 mg | ORAL_TABLET | Freq: Every day | ORAL | 1 refills | Status: DC

## 2022-09-15 ENCOUNTER — Encounter: Payer: Self-pay | Admitting: Internal Medicine

## 2022-09-15 ENCOUNTER — Other Ambulatory Visit: Payer: Self-pay | Admitting: Internal Medicine

## 2022-09-15 DIAGNOSIS — G8929 Other chronic pain: Secondary | ICD-10-CM

## 2022-09-15 MED ORDER — PREDNISONE 20 MG PO TABS: 20.0000 mg | ORAL_TABLET | Freq: Every day | ORAL | 0 refills | Status: DC

## 2022-11-24 ENCOUNTER — Encounter: Payer: Self-pay | Admitting: Radiology

## 2022-12-21 ENCOUNTER — Encounter: Payer: Self-pay | Admitting: Internal Medicine

## 2022-12-21 ENCOUNTER — Ambulatory Visit (INDEPENDENT_AMBULATORY_CARE_PROVIDER_SITE_OTHER): Payer: Medicare HMO | Admitting: Internal Medicine

## 2022-12-21 VITALS — BP 125/81 | HR 70 | Ht 71.0 in | Wt 212.0 lb

## 2022-12-21 DIAGNOSIS — R7303 Prediabetes: Secondary | ICD-10-CM

## 2022-12-21 DIAGNOSIS — E782 Mixed hyperlipidemia: Secondary | ICD-10-CM

## 2022-12-21 DIAGNOSIS — N401 Enlarged prostate with lower urinary tract symptoms: Secondary | ICD-10-CM

## 2022-12-21 DIAGNOSIS — E559 Vitamin D deficiency, unspecified: Secondary | ICD-10-CM | POA: Diagnosis not present

## 2022-12-21 DIAGNOSIS — N5234 Erectile dysfunction following simple prostatectomy: Secondary | ICD-10-CM | POA: Insufficient documentation

## 2022-12-21 DIAGNOSIS — N1832 Chronic kidney disease, stage 3b: Secondary | ICD-10-CM

## 2022-12-21 DIAGNOSIS — R338 Other retention of urine: Secondary | ICD-10-CM

## 2022-12-21 DIAGNOSIS — R809 Proteinuria, unspecified: Secondary | ICD-10-CM

## 2022-12-21 DIAGNOSIS — I82409 Acute embolism and thrombosis of unspecified deep veins of unspecified lower extremity: Secondary | ICD-10-CM | POA: Diagnosis not present

## 2022-12-21 DIAGNOSIS — M19011 Primary osteoarthritis, right shoulder: Secondary | ICD-10-CM | POA: Diagnosis not present

## 2022-12-21 DIAGNOSIS — I10 Essential (primary) hypertension: Secondary | ICD-10-CM

## 2022-12-21 DIAGNOSIS — M19012 Primary osteoarthritis, left shoulder: Secondary | ICD-10-CM

## 2022-12-21 MED ORDER — LOSARTAN POTASSIUM 25 MG PO TABS: 25.0000 mg | ORAL_TABLET | Freq: Every day | ORAL | 1 refills | Status: DC

## 2022-12-21 MED ORDER — TADALAFIL 20 MG PO TABS: ORAL_TABLET | ORAL | 11 refills | Status: AC

## 2022-12-21 NOTE — Assessment & Plan Note (Signed)
On Eliquis Follows up with Heme/Onc.

## 2022-12-21 NOTE — Assessment & Plan Note (Signed)
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

## 2022-12-21 NOTE — Assessment & Plan Note (Signed)
On finasteride S/p Urolift and TURP, followed by Urology

## 2022-12-21 NOTE — Patient Instructions (Signed)
Please continue to take medications as prescribed.  Please continue to follow low salt diet and perform moderate exercise/walking at least 150 mins/week.  Please contact Dr Toya Smothers office for follow up.

## 2022-12-21 NOTE — Assessment & Plan Note (Signed)
Likely has bilateral mild OA of shoulder Had adhesive capsulitis of left shoulder, completed OT Needs to perform home exercises as advised by OT If persistent, needs to contact orthopedic surgeon Tylenol arthritis as needed for pain

## 2022-12-21 NOTE — Assessment & Plan Note (Signed)
After TURP Cialis PRN prescribed

## 2022-12-21 NOTE — Assessment & Plan Note (Signed)
Last BMP reviewed, GFR improved to 49 in it Followed by Nephrology Advised to avoid oral NSAIDs On losartan Was on Sod Bicarb tablets, has run out of it - needs nephrology follow-up

## 2022-12-21 NOTE — Assessment & Plan Note (Signed)
On Atorvastatin Check lipid profile 

## 2022-12-21 NOTE — Assessment & Plan Note (Signed)
BP Readings from Last 1 Encounters:  12/21/22 125/81   Well-controlled with losartan 25 mg daily Counseled for compliance with the medications Advised DASH diet and moderate exercise/walking, at least 150 mins/week

## 2022-12-21 NOTE — Progress Notes (Signed)
Established Patient Office Visit  Subjective:  Patient ID: Bruce Adkins, male    DOB: 03-07-1956  Age: 67 y.o. MRN: [id number]  CC:  Chief Complaint  Patient presents with   Hypertension    Six month follow up for hypertension and CKD. Patient is having stiffness and arthritis pain in both shoulders.    HPI Bruce Adkins is a 67 y.o. male with past medical history of recurrent unprovoked DVT, HTN, BPH, HLD, anxiety and seasonal allergies who presents for f/u of his chronic medical conditions.  HTN: He has been taking losartan 25 mg QD now. BP is well-controlled. Takes medications regularly. Patient denies headache, dizziness, chest pain, dyspnea or palpitations.  His cough has resolved since stopping lisinopril.  CKD: Followed by Nephrology, but needs follow-up. Denies any dysuria or hematuria.   BPH: Had Urolift procedure, but still had urinary retention.  He had TURP in 06/23.  He denies urinary retention, hesitance, resistance now. Denies any dysuria or hematuria currently.  He is currently taking finasteride.  He has had erectile dysfunction since TURP.  He complains of bilateral shoulder pain.  Pain is intermittent, worse with abduction and radiates towards the arm.  Denies any numbness or tingling of the UE.  He had left shoulder pain in 2023, and had orthopedic surgery evaluation.  He had OT for adhesive capsulitis.  He has stopped shoulder exercises now, but agrees to start doing shoulder exercises as advised by OT at home.   Past Medical History:  Diagnosis Date   Arthritis    BPH with obstruction/lower urinary tract symptoms    CKD (chronic kidney disease)    DVT (deep venous thrombosis) ([place]) 2016   LLE   GERD (gastroesophageal reflux disease)    Hyperlipidemia    Hypertension    Panic attacks     Past Surgical History:  Procedure Laterality Date   BALLOON DILATION  12/30/2020   Procedure: BALLOON DILATION;  Surgeon: Harvel Quale, MD;  [facility];  Service: Gastroenterology;;   BIOPSY  12/30/2020   Procedure: BIOPSY;  Surgeon: Harvel Quale, MD;  [facility];  Service: Gastroenterology;;   COLONOSCOPY     COLONOSCOPY WITH PROPOFOL N/A 12/30/2020   Procedure: COLONOSCOPY WITH PROPOFOL;  Surgeon: Harvel Quale, MD;  [facility];  Service: Gastroenterology;  Laterality: N/A;   ESOPHAGOGASTRODUODENOSCOPY (EGD) WITH PROPOFOL N/A 12/30/2020   Procedure: ESOPHAGOGASTRODUODENOSCOPY (EGD) WITH PROPOFOL;  Surgeon: Harvel Quale, MD;  [facility];  Service: Gastroenterology;  Laterality: N/A;  am   TRANSURETHRAL RESECTION OF PROSTATE N/A 03/10/2022   Procedure: TRANSURETHRAL RESECTION OF THE PROSTATE (TURP);  Surgeon: Franchot Gallo, MD;  [facility]  Service: Urology;  Laterality: N/A;    Family History  Problem Relation Age of Onset   Heart failure Mother    Hypertension Mother    Anxiety disorder Mother    Hypertension Father    Cancer Brother     Social History   Socioeconomic History   Marital status: Married    Spouse name: Keino Wates   Number of children: 3   Years of education: Not on file   Highest education level: GED or equivalent  Occupational History   Not on file  Tobacco Use   Smoking status: Never   Smokeless tobacco: Never  Vaping Use   Vaping Use: Never used  Substance and Sexual Activity   Alcohol use: No   Drug use:  Not Currently   Sexual activity: Yes  Other Topics Concern   Not on file  Social History Narrative   Not on file   Social Determinants of Health   Financial Resource Strain: Low Risk  (12/20/2022)   Overall Financial Resource Strain (CARDIA)    Difficulty of Paying Living Expenses: Not hard at all  Food Insecurity: No Food Insecurity (12/20/2022)   Hunger Vital Sign    Worried About Running Out of Food in the Last Year: Never true    Ran Out of Food in the Last Year: Never true   Transportation Needs: No Transportation Needs (12/20/2022)   PRAPARE - Hydrologist (Medical): No    Lack of Transportation (Non-Medical): No  Physical Activity: Unknown (12/20/2022)   Exercise Vital Sign    Days of Exercise per Week: 0 days    Minutes of Exercise per Session: Not on file  Stress: Stress Concern Present (12/20/2022)   [place]    Feeling of Stress : To some extent  Social Connections: Unknown (12/20/2022)   Social Connection and Isolation Panel [NHANES]    Frequency of Communication with Friends and Family: Patient declined    Frequency of Social Gatherings with Friends and Family: Patient declined    Attends Religious Services: More than 4 times per year    Active Member of [employer] or Organizations: Patient declined    Attends Archivist Meetings: Not on file    Marital Status: Married  Intimate Partner Violence: Not At Risk (09/08/2020)   Humiliation, Afraid, Rape, and Kick questionnaire    Fear of Current or Ex-Partner: No    Emotionally Abused: No    Physically Abused: No    Sexually Abused: No    Outpatient Medications Prior to Visit  Medication Sig Dispense Refill   SHINGRIX injection      acetaminophen (TYLENOL) 500 MG tablet Take 1,000 mg by mouth every 6 (six) hours as needed for mild pain.     ALPRAZolam (XANAX) 0.25 MG tablet Take 1 tablet (0.25 mg total) by mouth at bedtime. 30 tablet 1   apixaban (ELIQUIS) 5 MG TABS tablet Take 1 tablet (5 mg total) by mouth 2 (two) times daily. 60 tablet 5   finasteride (PROSCAR) 5 MG tablet Take 1 tablet (5 mg total) by mouth daily. 90 tablet 3   fluticasone (FLONASE) 50 MCG/ACT nasal spray Place 2 sprays into both nostrils daily. 16 g 6   sodium bicarbonate 650 MG tablet Take 1 tablet (650 mg total) by mouth in the morning, in the evening, and at bedtime. 90 tablet 2   baclofen (LIORESAL) 10 MG tablet Take 1 tablet  (10 mg total) by mouth at bedtime as needed for muscle spasms. 30 each 2   bethanechol (URECHOLINE) 25 MG tablet Take 1 tablet (25 mg total) by mouth 3 (three) times daily. 45 tablet 0   famotidine (PEPCID) 40 MG tablet TAKE 1 TABLET (40 MG TOTAL) BY MOUTH DAILY. (Patient taking differently: Take 40 mg by mouth daily as needed for heartburn or indigestion.) 30 tablet 3   losartan (COZAAR) 25 MG tablet Take 1 tablet (25 mg total) by mouth daily. 90 tablet 1   predniSONE (DELTASONE) 20 MG tablet Take 1 tablet (20 mg total) by mouth daily with breakfast. 5 tablet 0   tadalafil (CIALIS) 20 MG tablet 1/2 to 1 tab po prn 30 tablet 11   No facility-administered medications  prior to visit.    No Known Allergies  ROS Review of Systems  Constitutional:  Negative for chills and fever.  HENT:  Negative for congestion and sore throat.   Eyes:  Negative for pain and discharge.  Respiratory:  Negative for cough and shortness of breath.   Cardiovascular:  Negative for chest pain and palpitations.  Gastrointestinal:  Negative for constipation, diarrhea and vomiting.  Endocrine: Negative for polydipsia and polyuria.  Genitourinary:  Negative for dysuria and hematuria.  Musculoskeletal:  Positive for arthralgias (B/l shoulder) and back pain. Negative for neck pain and neck stiffness.  Skin:  Negative for rash.  Neurological:  Negative for dizziness, weakness, numbness and headaches.  Psychiatric/Behavioral:  Negative for agitation and behavioral problems.       Objective:    Physical Exam Vitals reviewed.  Constitutional:      General: He is not in acute distress.    Appearance: He is not diaphoretic.  HENT:     Head: Normocephalic and atraumatic.     Nose: Nose normal.     Mouth/Throat:     Mouth: Mucous membranes are moist.  Eyes:     General: No scleral icterus.    Extraocular Movements: Extraocular movements intact.  Cardiovascular:     Rate and Rhythm: Normal rate and regular rhythm.      Pulses: Normal pulses.     Heart sounds: Normal heart sounds. No murmur heard. Pulmonary:     Breath sounds: Normal breath sounds. No wheezing or rales.  Musculoskeletal:     Right shoulder: No tenderness. Decreased range of motion.     Left shoulder: No tenderness. Decreased range of motion.     Cervical back: Neck supple. No tenderness.     Right lower leg: No edema.     Left lower leg: No edema.  Skin:    General: Skin is warm.     Findings: No rash.  Neurological:     General: No focal deficit present.     Mental Status: He is alert and oriented to person, place, and time.     Cranial Nerves: No cranial nerve deficit.     Sensory: No sensory deficit.  Psychiatric:        Mood and Affect: Mood normal.        Behavior: Behavior normal.     BP 125/81 (BP Location: Left Arm, Patient Position: Sitting, Cuff Size: Large)   Pulse 70   Ht 5\' 11"  (1.803 m)   Wt 212 lb (96.2 kg)   SpO2 94%   BMI 29.57 kg/m  Wt Readings from Last 3 Encounters:  12/21/22 212 lb (96.2 kg)  08/24/22 211 lb 6.4 oz (95.9 kg)  08/16/22 202 lb (91.6 kg)    Lab Results  Component Value Date   TSH 0.313 (L) 01/31/2022   Lab Results  Component Value Date   WBC 8.6 03/22/2022   HGB 14.3 03/22/2022   HCT 42.7 03/22/2022   MCV 93 03/22/2022   PLT 210 03/22/2022   Lab Results  Component Value Date   NA 142 03/10/2022   K 3.9 03/10/2022   CO2 25 01/31/2022   GLUCOSE 106 (H) 03/10/2022   BUN 17 03/10/2022   CREATININE 1.70 (H) 03/10/2022   BILITOT 0.5 01/31/2022   ALKPHOS 71 01/31/2022   AST 11 01/31/2022   ALT 15 01/31/2022   PROT 6.9 01/31/2022   ALBUMIN 4.4 01/31/2022   CALCIUM 9.4 01/31/2022   ANIONGAP 8 06/13/2021   EGFR  44 (L) 01/31/2022   Lab Results  Component Value Date   CHOL 202 (H) 01/31/2022   Lab Results  Component Value Date   HDL 67 01/31/2022   Lab Results  Component Value Date   LDLCALC 124 (H) 01/31/2022   Lab Results  Component Value Date   TRIG 62  01/31/2022   Lab Results  Component Value Date   CHOLHDL 3.0 01/31/2022   Lab Results  Component Value Date   HGBA1C 6.0 (H) 01/31/2022      Assessment & Plan:   Problem List Items Addressed This Visit       Cardiovascular and Mediastinum   Essential hypertension - Primary    BP Readings from Last 1 Encounters:  12/21/22 125/81  Well-controlled with losartan 25 mg daily Counseled for compliance with the medications Advised DASH diet and moderate exercise/walking, at least 150 mins/week      Relevant Medications   tadalafil (CIALIS) 20 MG tablet   losartan (COZAAR) 25 MG tablet   Other Relevant Orders   TSH   CMP14+EGFR   CBC with Differential/Platelet   Recurrent deep vein thrombosis (DVT) ([place])    On Eliquis Follows up with Heme/Onc.      Relevant Medications   tadalafil (CIALIS) 20 MG tablet   losartan (COZAAR) 25 MG tablet     Musculoskeletal and Integument   Primary osteoarthritis of both shoulders    Likely has bilateral mild OA of shoulder Had adhesive capsulitis of left shoulder, completed OT Needs to perform home exercises as advised by OT If persistent, needs to contact orthopedic surgeon Tylenol arthritis as needed for pain        Genitourinary   BPH (benign prostatic hyperplasia)    On finasteride S/p Urolift and TURP, followed by Urology      Stage 3b chronic kidney disease (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    Dispense:  90 tablet    Refill:  1    Follow-up: Return in about 4 months (around 04/22/2023) for Annual physical.  Angles Trevizo K Lari Linson, MD 

## 2022-12-22 ENCOUNTER — Other Ambulatory Visit: Payer: Self-pay | Admitting: Internal Medicine

## 2022-12-22 DIAGNOSIS — E782 Mixed hyperlipidemia: Secondary | ICD-10-CM

## 2022-12-22 MED ORDER — ATORVASTATIN CALCIUM 40 MG PO TABS: 40.0000 mg | ORAL_TABLET | Freq: Every day | ORAL | 3 refills | Status: DC

## 2022-12-23 LAB — CMP14+EGFR
ALT: 16 IU/L (ref 0–44)
AST: 17 IU/L (ref 0–40)
Albumin/Globulin Ratio: 1.4 (ref 1.2–2.2)
Albumin: 4.4 g/dL (ref 3.9–4.9)
Alkaline Phosphatase: 98 IU/L (ref 44–121)
BUN/Creatinine Ratio: 9 — ABNORMAL LOW (ref 10–24)
BUN: 16 mg/dL (ref 8–27)
Bilirubin Total: 0.3 mg/dL (ref 0.0–1.2)
CO2: 21 mmol/L (ref 20–29)
Calcium: 9.3 mg/dL (ref 8.6–10.2)
Chloride: 101 mmol/L (ref 96–106)
Creatinine, Ser: 1.72 mg/dL — ABNORMAL HIGH (ref 0.76–1.27)
Globulin, Total: 3.1 g/dL (ref 1.5–4.5)
Glucose: 97 mg/dL (ref 70–99)
Potassium: 4.8 mmol/L (ref 3.5–5.2)
Sodium: 138 mmol/L (ref 134–144)
Total Protein: 7.5 g/dL (ref 6.0–8.5)
eGFR: 44 mL/min/{1.73_m2} — ABNORMAL LOW (ref >59)

## 2022-12-23 LAB — CBC WITH DIFFERENTIAL/PLATELET
Basophils Absolute: 0.1 10*3/uL (ref 0.0–0.2)
Basos: 1 %
EOS (ABSOLUTE): 0.1 10*3/uL (ref 0.0–0.4)
Eos: 2 %
Hematocrit: 43 % (ref 37.5–51.0)
Hemoglobin: 14.3 g/dL (ref 13.0–17.7)
Immature Grans (Abs): 0 10*3/uL (ref 0.0–0.1)
Immature Granulocytes: 0 %
Lymphocytes Absolute: 2 10*3/uL (ref 0.7–3.1)
Lymphs: 49 %
MCH: 31.4 pg (ref 26.6–33.0)
MCHC: 33.3 g/dL (ref 31.5–35.7)
MCV: 94 fL (ref 79–97)
Monocytes Absolute: 0.4 10*3/uL (ref 0.1–0.9)
Monocytes: 9 %
Neutrophils Absolute: 1.6 10*3/uL (ref 1.4–7.0)
Neutrophils: 39 %
Platelets: 207 10*3/uL (ref 150–450)
RBC: 4.56 x10E6/uL (ref 4.14–5.80)
RDW: 13.3 % (ref 11.6–15.4)
WBC: 4.1 10*3/uL (ref 3.4–10.8)

## 2022-12-23 LAB — PROTEIN / CREATININE RATIO, URINE
Creatinine, Urine: 88 mg/dL
Protein, Ur: 6.3 mg/dL
Protein/Creat Ratio: 72 mg/g creat (ref 0–200)

## 2022-12-23 LAB — LIPID PANEL
Chol/HDL Ratio: 4.7 ratio (ref 0.0–5.0)
Cholesterol, Total: 227 mg/dL — ABNORMAL HIGH (ref 100–199)
HDL: 48 mg/dL (ref >39)
LDL Chol Calc (NIH): 155 mg/dL — ABNORMAL HIGH (ref 0–99)
Triglycerides: 132 mg/dL (ref 0–149)
VLDL Cholesterol Cal: 24 mg/dL (ref 5–40)

## 2022-12-23 LAB — TSH: TSH: 0.714 u[IU]/mL (ref 0.450–4.500)

## 2022-12-23 LAB — HEMOGLOBIN A1C
Est. average glucose Bld gHb Est-mCnc: 137 mg/dL
Hgb A1c MFr Bld: 6.4 % — ABNORMAL HIGH (ref 4.8–5.6)

## 2022-12-23 LAB — VITAMIN D 25 HYDROXY (VIT D DEFICIENCY, FRACTURES): Vit D, 25-Hydroxy: 26.1 ng/mL — ABNORMAL LOW (ref 30.0–100.0)

## 2022-12-29 ENCOUNTER — Other Ambulatory Visit: Payer: Self-pay | Admitting: Internal Medicine

## 2022-12-29 DIAGNOSIS — I82409 Acute embolism and thrombosis of unspecified deep veins of unspecified lower extremity: Secondary | ICD-10-CM

## 2023-03-21 ENCOUNTER — Ambulatory Visit: Payer: Medicare HMO

## 2023-03-27 DIAGNOSIS — Z79899 Other long term (current) drug therapy: Secondary | ICD-10-CM | POA: Diagnosis not present

## 2023-03-27 DIAGNOSIS — R809 Proteinuria, unspecified: Secondary | ICD-10-CM | POA: Diagnosis not present

## 2023-03-27 DIAGNOSIS — D649 Anemia, unspecified: Secondary | ICD-10-CM | POA: Diagnosis not present

## 2023-03-27 DIAGNOSIS — N189 Chronic kidney disease, unspecified: Secondary | ICD-10-CM | POA: Diagnosis not present

## 2023-03-27 DIAGNOSIS — R7303 Prediabetes: Secondary | ICD-10-CM | POA: Diagnosis not present

## 2023-03-27 DIAGNOSIS — N1832 Chronic kidney disease, stage 3b: Secondary | ICD-10-CM | POA: Diagnosis not present

## 2023-04-04 NOTE — Telephone Encounter (Signed)
scheduled

## 2023-04-05 DIAGNOSIS — N2581 Secondary hyperparathyroidism of renal origin: Secondary | ICD-10-CM | POA: Diagnosis not present

## 2023-04-05 DIAGNOSIS — I129 Hypertensive chronic kidney disease with stage 1 through stage 4 chronic kidney disease, or unspecified chronic kidney disease: Secondary | ICD-10-CM | POA: Diagnosis not present

## 2023-04-05 DIAGNOSIS — E875 Hyperkalemia: Secondary | ICD-10-CM | POA: Diagnosis not present

## 2023-04-05 DIAGNOSIS — R809 Proteinuria, unspecified: Secondary | ICD-10-CM | POA: Diagnosis not present

## 2023-04-21 ENCOUNTER — Other Ambulatory Visit: Payer: Self-pay | Admitting: Internal Medicine

## 2023-04-21 DIAGNOSIS — I82409 Acute embolism and thrombosis of unspecified deep veins of unspecified lower extremity: Secondary | ICD-10-CM

## 2023-04-21 DIAGNOSIS — F419 Anxiety disorder, unspecified: Secondary | ICD-10-CM

## 2023-04-27 ENCOUNTER — Encounter: Payer: Commercial Managed Care - PPO | Admitting: Internal Medicine

## 2023-05-17 ENCOUNTER — Encounter: Payer: Self-pay | Admitting: Internal Medicine

## 2023-05-17 ENCOUNTER — Ambulatory Visit (INDEPENDENT_AMBULATORY_CARE_PROVIDER_SITE_OTHER): Payer: Medicare HMO | Admitting: Internal Medicine

## 2023-05-17 VITALS — BP 132/77 | HR 70 | Ht 71.0 in | Wt 220.0 lb

## 2023-05-17 DIAGNOSIS — R338 Other retention of urine: Secondary | ICD-10-CM

## 2023-05-17 DIAGNOSIS — E1122 Type 2 diabetes mellitus with diabetic chronic kidney disease: Secondary | ICD-10-CM

## 2023-05-17 DIAGNOSIS — Z0001 Encounter for general adult medical examination with abnormal findings: Secondary | ICD-10-CM

## 2023-05-17 DIAGNOSIS — E785 Hyperlipidemia, unspecified: Secondary | ICD-10-CM

## 2023-05-17 DIAGNOSIS — N401 Enlarged prostate with lower urinary tract symptoms: Secondary | ICD-10-CM

## 2023-05-17 DIAGNOSIS — I82409 Acute embolism and thrombosis of unspecified deep veins of unspecified lower extremity: Secondary | ICD-10-CM

## 2023-05-17 DIAGNOSIS — E1169 Type 2 diabetes mellitus with other specified complication: Secondary | ICD-10-CM | POA: Insufficient documentation

## 2023-05-17 DIAGNOSIS — N1832 Chronic kidney disease, stage 3b: Secondary | ICD-10-CM | POA: Diagnosis not present

## 2023-05-17 DIAGNOSIS — I10 Essential (primary) hypertension: Secondary | ICD-10-CM | POA: Diagnosis not present

## 2023-05-17 MED ORDER — OZEMPIC (0.25 OR 0.5 MG/DOSE) 2 MG/3ML ~~LOC~~ SOPN: PEN_INJECTOR | SUBCUTANEOUS | 1 refills | Status: DC

## 2023-05-17 NOTE — Assessment & Plan Note (Signed)
HbA1c: 6.5 (07/24)  New onset Advised to follow diabetic diet Started Ozempic considering his history of CKD - plan to increase dose as tolerated F/u CMP and lipid panel Diabetic foot exam: Today Diabetic eye exam: Advised to follow up with Ophthalmology for diabetic eye exam

## 2023-05-17 NOTE — Progress Notes (Signed)
Established Patient Office Visit  Subjective:  Patient ID: Bruce Adkins, male    DOB: 1956/08/11  Age: 67 y.o. MRN: [id number]  CC:  Chief Complaint  Patient presents with   Annual Exam    HPI Bruce Adkins is a 67 y.o. male with past medical history of recurrent unprovoked DVT, HTN, type 2 DM, BPH, HLD, anxiety and seasonal allergies who presents for annual physical.  HTN: He has been taking losartan 25 mg QD now. BP is well-controlled. Takes medications regularly. Patient denies headache, dizziness, chest pain, dyspnea or palpitations.  His cough has resolved since stopping lisinopril.  Type 2 DM: His HbA1c was recently 6.5, increased from 6.4 in 04/24.  He denies any polyuria, polydipsia or chronic fatigue currently.  CKD: Followed by Nephrology, but needs follow-up. Denies any dysuria or hematuria.   BPH: Had Urolift procedure, but still had urinary retention.  He had TURP in 06/23.  He denies urinary retention, hesitance, resistance now. Denies any dysuria or hematuria currently.  He is currently taking finasteride.  He has had erectile dysfunction since TURP.    Past Medical History:  Diagnosis Date   Arthritis    BPH with obstruction/lower urinary tract symptoms    CKD (chronic kidney disease)    DVT (deep venous thrombosis) (HCC) 2016   LLE   GERD (gastroesophageal reflux disease)    Hyperlipidemia    Hypertension    Panic attacks     Past Surgical History:  Procedure Laterality Date   BALLOON DILATION  12/30/2020   Procedure: BALLOON DILATION;  Surgeon: Dolores Frame, MD;  Location: [facility];  Service: Gastroenterology;;   BIOPSY  12/30/2020   Procedure: BIOPSY;  Surgeon: Dolores Frame, MD;  Location: [facility];  Service: Gastroenterology;;   COLONOSCOPY     COLONOSCOPY WITH PROPOFOL N/A 12/30/2020   Procedure: COLONOSCOPY WITH PROPOFOL;  Surgeon: Dolores Frame, MD;  Location: [facility];  Service:  Gastroenterology;  Laterality: N/A;   ESOPHAGOGASTRODUODENOSCOPY (EGD) WITH PROPOFOL N/A 12/30/2020   Procedure: ESOPHAGOGASTRODUODENOSCOPY (EGD) WITH PROPOFOL;  Surgeon: Dolores Frame, MD;  Location: [facility];  Service: Gastroenterology;  Laterality: N/A;  am   TRANSURETHRAL RESECTION OF PROSTATE N/A 03/10/2022   Procedure: TRANSURETHRAL RESECTION OF THE PROSTATE (TURP);  Surgeon: Marcine Matar, MD;  Location: [facility];  Service: Urology;  Laterality: N/A;    Family History  Problem Relation Age of Onset   Heart failure Mother    Hypertension Mother    Anxiety disorder Mother    Hypertension Father    Cancer Brother     Social History   Socioeconomic History   Marital status: Married    Spouse name: Tymaine Coello   Number of children: 3   Years of education: Not on file   Highest education level: GED or equivalent  Occupational History   Not on file  Tobacco Use   Smoking status: Never   Smokeless tobacco: Never  Vaping Use   Vaping status: Never Used  Substance and Sexual Activity   Alcohol use: No   Drug use: Not Currently   Sexual activity: Yes  Other Topics Concern   Not on file  Social History Narrative   Not on file   Social Determinants of Health   Financial Resource Strain: Low Risk  (12/20/2022)   Overall Financial Resource Strain (CARDIA)    Difficulty of Paying Living Expenses: Not hard at all  Food Insecurity: No Food  Insecurity (12/20/2022)   Hunger Vital Sign    Worried About Running Out of Food in the Last Year: Never true    Ran Out of Food in the Last Year: Never true  Transportation Needs: No Transportation Needs (12/20/2022)   PRAPARE - Administrator, Civil Service (Medical): No    Lack of Transportation (Non-Medical): No  Physical Activity: Unknown (12/20/2022)   Exercise Vital Sign    Days of Exercise per Week: 0 days    Minutes of Exercise per Session: Not on file  Stress: Stress Concern  Present (12/20/2022)   [employer] of Occupational Health - Occupational Stress Questionnaire    Feeling of Stress : To some extent  Social Connections: Unknown (12/20/2022)   Social Connection and Isolation Panel [NHANES]    Frequency of Communication with Friends and Family: Patient declined    Frequency of Social Gatherings with Friends and Family: Patient declined    Attends Religious Services: More than 4 times per year    Active Member of [employer] or Organizations: Patient declined    Attends Banker Meetings: Not on file    Marital Status: Married  Intimate Partner Violence: Not At Risk (09/08/2020)   Humiliation, Afraid, Rape, and Kick questionnaire    Fear of Current or Ex-Partner: No    Emotionally Abused: No    Physically Abused: No    Sexually Abused: No    Outpatient Medications Prior to Visit  Medication Sig Dispense Refill   acetaminophen (TYLENOL) 500 MG tablet Take 1,000 mg by mouth every 6 (six) hours as needed for mild pain.     ALPRAZolam (XANAX) 0.25 MG tablet TAKE 1 TABLET BY MOUTH AT BEDTIME 30 tablet 0   atorvastatin (LIPITOR) 40 MG tablet Take 1 tablet (40 mg total) by mouth daily. 90 tablet 3   ELIQUIS 5 MG TABS tablet Take 1 tablet by mouth twice daily 180 tablet 0   finasteride (PROSCAR) 5 MG tablet Take 1 tablet (5 mg total) by mouth daily. 90 tablet 3   fluticasone (FLONASE) 50 MCG/ACT nasal spray Place 2 sprays into both nostrils daily. 16 g 6   losartan (COZAAR) 25 MG tablet Take 1 tablet (25 mg total) by mouth daily. 90 tablet 1   tadalafil (CIALIS) 20 MG tablet 1/2 to 1 tab po prn 30 tablet 11   sodium bicarbonate 650 MG tablet Take 1 tablet (650 mg total) by mouth in the morning, in the evening, and at bedtime. (Patient not taking: Reported on 05/17/2023) 90 tablet 2   No facility-administered medications prior to visit.    No Known Allergies  ROS Review of Systems  Constitutional:  Negative for chills and fever.  HENT:  Negative  for congestion and sore throat.   Eyes:  Negative for pain and discharge.  Respiratory:  Negative for cough and shortness of breath.   Cardiovascular:  Negative for chest pain and palpitations.  Gastrointestinal:  Negative for constipation, diarrhea and vomiting.  Endocrine: Negative for polydipsia and polyuria.  Genitourinary:  Positive for difficulty urinating. Negative for dysuria and hematuria.  Musculoskeletal:  Positive for arthralgias (L shoulder) and back pain. Negative for neck pain and neck stiffness.  Skin:  Negative for rash.  Neurological:  Negative for dizziness, weakness, numbness and headaches.  Psychiatric/Behavioral:  Negative for agitation and behavioral problems.       Objective:    Physical Exam Vitals reviewed.  Constitutional:      General: He is not in  acute distress.    Appearance: He is not diaphoretic.  HENT:     Head: Normocephalic and atraumatic.     Nose: Nose normal.     Mouth/Throat:     Mouth: Mucous membranes are moist.  Eyes:     General: No scleral icterus.    Extraocular Movements: Extraocular movements intact.  Cardiovascular:     Rate and Rhythm: Normal rate and regular rhythm.     Pulses: Normal pulses.     Heart sounds: Normal heart sounds. No murmur heard. Pulmonary:     Breath sounds: Normal breath sounds. No wheezing or rales.  Abdominal:     Palpations: Abdomen is soft.     Tenderness: There is no abdominal tenderness.  Musculoskeletal:     Cervical back: Neck supple. No tenderness.     Right lower leg: No edema.     Left lower leg: No edema.  Skin:    General: Skin is warm.     Findings: No rash.  Neurological:     General: No focal deficit present.     Mental Status: He is alert and oriented to person, place, and time.     Cranial Nerves: No cranial nerve deficit.     Sensory: No sensory deficit.     Motor: No weakness.  Psychiatric:        Mood and Affect: Mood normal.        Behavior: Behavior normal.     BP  132/77 (BP Location: Left Arm, Patient Position: Sitting, Cuff Size: Normal)   Pulse 70   Ht 5\' 11"  (1.803 m)   Wt 220 lb (99.8 kg)   SpO2 96%   BMI 30.68 kg/m  Wt Readings from Last 3 Encounters:  05/17/23 220 lb (99.8 kg)  12/21/22 212 lb (96.2 kg)  08/24/22 211 lb 6.4 oz (95.9 kg)    Lab Results  Component Value Date   TSH 0.714 12/21/2022   Lab Results  Component Value Date   WBC 4.1 12/21/2022   HGB 14.3 12/21/2022   HCT 43.0 12/21/2022   MCV 94 12/21/2022   PLT 207 12/21/2022   Lab Results  Component Value Date   NA 138 12/21/2022   K 4.8 12/21/2022   CO2 21 12/21/2022   GLUCOSE 97 12/21/2022   BUN 16 12/21/2022   CREATININE 1.72 (H) 12/21/2022   BILITOT 0.3 12/21/2022   ALKPHOS 98 12/21/2022   AST 17 12/21/2022   ALT 16 12/21/2022   PROT 7.5 12/21/2022   ALBUMIN 4.4 12/21/2022   CALCIUM 9.3 12/21/2022   ANIONGAP 8 06/13/2021   EGFR 44 (L) 12/21/2022   Lab Results  Component Value Date   CHOL 227 (H) 12/21/2022   Lab Results  Component Value Date   HDL 48 12/21/2022   Lab Results  Component Value Date   LDLCALC 155 (H) 12/21/2022   Lab Results  Component Value Date   TRIG 132 12/21/2022   Lab Results  Component Value Date   CHOLHDL 4.7 12/21/2022   Lab Results  Component Value Date   HGBA1C 6.4 (H) 12/21/2022      Assessment & Plan:   Problem List Items Addressed This Visit       Cardiovascular and Mediastinum   Essential hypertension    BP Readings from Last 1 Encounters:  05/17/23 132/77   Well-controlled with losartan 25 mg daily Counseled for compliance with the medications Advised DASH diet and moderate exercise/walking, at least 150 mins/week  Recurrent deep vein thrombosis (DVT) (HCC)    On Eliquis Follows up with Heme/Onc.        Endocrine   Type 2 diabetes mellitus with stage 3b chronic kidney disease, without long-term current use of insulin (HCC)    HbA1c: 6.5 (07/24)  New onset Advised to follow  diabetic diet Started Ozempic considering his history of CKD - plan to increase dose as tolerated F/u CMP and lipid panel Diabetic foot exam: Today Diabetic eye exam: Advised to follow up with Ophthalmology for diabetic eye exam      Relevant Medications   Semaglutide,0.25 or 0.5MG /DOS, (OZEMPIC, 0.25 OR 0.5 MG/DOSE,) 2 MG/3ML SOPN   Other Relevant Orders   CMP14+EGFR   Hemoglobin A1c     Genitourinary   BPH (benign prostatic hyperplasia)    On finasteride S/p Urolift and TURP, followed by Urology      Stage 3b chronic kidney disease (HCC)    Last BMP reviewed, GFR stays around 45 Followed by Nephrology Advised to avoid oral NSAIDs On losartan Was on Sod Bicarb tablets - needs nephrology follow-up      Relevant Orders   CMP14+EGFR     Other   HLD (hyperlipidemia)    On Atorvastatin Check lipid profile      Relevant Orders   Lipid Profile   Encounter for general adult medical examination with abnormal findings - Primary    Physical exam as documented. Fasting blood tests ordered.       Meds ordered this encounter  Medications   Semaglutide,0.25 or 0.5MG /DOS, (OZEMPIC, 0.25 OR 0.5 MG/DOSE,) 2 MG/3ML SOPN    Sig: Inject 0.25 mg into the skin every 7 (seven) days for 28 days, THEN 0.5 mg every 7 (seven) days.    Dispense:  3 mL    Refill:  1    Follow-up: Return in about 3 months (around 08/17/2023) for DM and CKD.    Anabel Halon, MD

## 2023-05-17 NOTE — Patient Instructions (Addendum)
Please start taking Ozempic as prescribed - 0.25 mg once weekly for 4 weeks and then 0.5 mg.  Please continue to take medications as prescribed.  Please continue to follow low carb diet and perform moderate exercise/walking at least 150 mins/week.  Please get fasting blood tests done before the next visit.  Please consider getting Tdap vaccine at local pharmacy.

## 2023-05-17 NOTE — Assessment & Plan Note (Signed)
On Eliquis Follows up with Heme/Onc. 

## 2023-05-17 NOTE — Assessment & Plan Note (Signed)
On finasteride S/p Urolift and TURP, followed by Urology

## 2023-05-17 NOTE — Assessment & Plan Note (Signed)
BP Readings from Last 1 Encounters:  05/17/23 132/77   Well-controlled with losartan 25 mg daily Counseled for compliance with the medications Advised DASH diet and moderate exercise/walking, at least 150 mins/week

## 2023-05-17 NOTE — Assessment & Plan Note (Addendum)
Last BMP reviewed, GFR stays around 45 Followed by Nephrology Advised to avoid oral NSAIDs On losartan Was on Sod Bicarb tablets - needs nephrology follow-up

## 2023-05-17 NOTE — Assessment & Plan Note (Signed)
Physical exam as documented. Fasting blood tests ordered. 

## 2023-05-17 NOTE — Assessment & Plan Note (Signed)
On Atorvastatin Check lipid profile 

## 2023-05-17 NOTE — Assessment & Plan Note (Signed)
Lab Results  Component Value Date   HGBA1C 6.4 (H) 12/21/2022   Advised to follow low carbohydrate diet

## 2023-06-01 ENCOUNTER — Other Ambulatory Visit: Payer: Self-pay | Admitting: Urology

## 2023-06-01 DIAGNOSIS — N138 Other obstructive and reflux uropathy: Secondary | ICD-10-CM

## 2023-06-07 ENCOUNTER — Telehealth: Payer: Self-pay

## 2023-06-07 NOTE — Telephone Encounter (Signed)
Patient is made aware of Dr. Retta Diones response "Please let Bruce Adkins know that he does not need to be on finasteride anymore." Voiced understanding

## 2023-06-09 ENCOUNTER — Telehealth: Payer: Self-pay

## 2023-06-09 NOTE — Telephone Encounter (Signed)
Patient was made aware D/C finasteride Per Dr. Retta Diones

## 2023-06-13 ENCOUNTER — Ambulatory Visit
Admission: EM | Admit: 2023-06-13 | Discharge: 2023-06-13 | Disposition: A | Discharge status: Home / Self Care | Payer: Medicare HMO | Attending: Family Medicine | Admitting: Family Medicine

## 2023-06-13 DIAGNOSIS — T7840XA Allergy, unspecified, initial encounter: Secondary | ICD-10-CM | POA: Diagnosis not present

## 2023-06-13 MED ORDER — METHYLPREDNISOLONE SODIUM SUCC 125 MG IJ SOLR
80.0000 mg | Freq: Once | INTRAMUSCULAR | Status: AC
  Administered 2023-06-13: 80 mg via INTRAMUSCULAR

## 2023-06-13 NOTE — ED Provider Notes (Signed)
[facility]    CSN: [id number] Arrival date & time: 06/13/23  0843      History   Chief Complaint Chief Complaint  Patient presents with   Insect Bite    HPI Bruce Adkins is a 67 y.o. male.   Patient presenting today with a bee sting to the left forearm that occurred 3 days ago.  States the area has become increasingly more red, swollen and painful.  Denies throat itching or swelling, chest tightness, shortness of breath, nausea, vomiting, fevers.  Has been putting calamine gel on the area and taking a Benadryl here and there with minimal relief.    Past Medical History:  Diagnosis Date   Arthritis    BPH with obstruction/lower urinary tract symptoms    CKD (chronic kidney disease)    DVT (deep venous thrombosis) (HCC) 2016   LLE   GERD (gastroesophageal reflux disease)    Hyperlipidemia    Hypertension    Panic attacks     Patient Active Problem List   Diagnosis Date Noted   Type 2 diabetes mellitus with stage 3b chronic kidney disease, without long-term current use of insulin (HCC) 05/17/2023   Primary osteoarthritis of both shoulders 12/21/2022   Erectile dysfunction 12/21/2022   Proteinuria 08/24/2022   Encounter for general adult medical examination with abnormal findings 01/31/2022   Chronic low back pain without sciatica 07/29/2021   Chronic left shoulder pain 07/29/2021   Stage 3b chronic kidney disease (HCC) 07/29/2021   Tachycardia 03/10/2021   Constipation 12/17/2020   Anemia 12/17/2020   BPH (benign prostatic hyperplasia) 10/21/2020   Recurrent deep vein thrombosis (DVT) (HCC) 03/13/2019   HLD (hyperlipidemia) 11/01/2006   Anxiety 11/01/2006   Essential hypertension 11/01/2006   Allergic rhinitis 11/01/2006   GERD 11/01/2006   OSTEOARTHRITIS 11/01/2006    Past Surgical History:  Procedure Laterality Date   BALLOON DILATION  12/30/2020   Procedure: BALLOON DILATION;  Surgeon: Dolores Frame, MD;  Location: [facility];   Service: Gastroenterology;;   BIOPSY  12/30/2020   Procedure: BIOPSY;  Surgeon: Dolores Frame, MD;  Location: [facility];  Service: Gastroenterology;;   COLONOSCOPY     COLONOSCOPY WITH PROPOFOL N/A 12/30/2020   Procedure: COLONOSCOPY WITH PROPOFOL;  Surgeon: Dolores Frame, MD;  [facility];  Service: Gastroenterology;  Laterality: N/A;   ESOPHAGOGASTRODUODENOSCOPY (EGD) WITH PROPOFOL N/A 12/30/2020   Procedure: ESOPHAGOGASTRODUODENOSCOPY (EGD) WITH PROPOFOL;  Surgeon: Dolores Frame, MD;  [facility];  Service: Gastroenterology;  Laterality: N/A;  am   TRANSURETHRAL RESECTION OF PROSTATE N/A 03/10/2022   Procedure: TRANSURETHRAL RESECTION OF THE PROSTATE (TURP);  Surgeon: Marcine Matar, MD;  Location: [facility];  Service: Urology;  Laterality: N/A;       Home Medications    Prior to Admission medications   Medication Sig Start Date End Date Taking? Authorizing Provider  acetaminophen (TYLENOL) 500 MG tablet Take 1,000 mg by mouth every 6 (six) hours as needed for mild pain.    [provider]  ALPRAZolam Prudy Feeler) 0.25 MG tablet TAKE 1 TABLET BY MOUTH AT BEDTIME 04/21/23   Anabel Halon, MD  atorvastatin (LIPITOR) 40 MG tablet Take 1 tablet (40 mg total) by mouth daily. 12/22/22   Anabel Halon, MD  ELIQUIS 5 MG TABS tablet Take 1 tablet by mouth twice daily 04/21/23   Anabel Halon, MD  finasteride (PROSCAR) 5 MG tablet Take 1 tablet (5 mg total) by  mouth daily. 05/17/22   Marcine Matar, MD  fluticasone (FLONASE) 50 MCG/ACT nasal spray Place 2 sprays into both nostrils daily. 08/24/22   Anabel Halon, MD  losartan (COZAAR) 25 MG tablet Take 1 tablet (25 mg total) by mouth daily. 12/21/22   Anabel Halon, MD  Semaglutide,0.25 or 0.5MG /DOS, (OZEMPIC, 0.25 OR 0.5 MG/DOSE,) 2 MG/3ML SOPN Inject 0.25 mg into the skin every 7 (seven) days for 28 days, THEN 0.5 mg every 7 (seven) days. 05/17/23 09/12/23   Anabel Halon, MD  tadalafil (CIALIS) 20 MG tablet 1/2 to 1 tab po prn 12/21/22   Anabel Halon, MD    Family History Family History  Problem Relation Age of Onset   Heart failure Mother    Hypertension Mother    Anxiety disorder Mother    Hypertension Father    Cancer Brother     Social History Social History   Tobacco Use   Smoking status: Never   Smokeless tobacco: Never  Vaping Use   Vaping status: Never Used  Substance Use Topics   Alcohol use: No   Drug use: Not Currently     Allergies   Patient has no known allergies.   Review of Systems Review of Systems PER HPI  Physical Exam Triage Vital Signs ED Triage Vitals [06/13/23 0948]  Encounter Vitals Group     BP 117/76     Systolic BP Percentile      Diastolic BP Percentile      Pulse Rate 77     Resp 17     Temp 98.1 F (36.7 C)     Temp src      SpO2 97 %     Weight      Height      Head Circumference      Peak Flow      Pain Score 0     Pain Loc      Pain Education      Exclude from Growth Chart    No data found.  Updated Vital Signs BP 117/76   Pulse 77   Temp 98.1 F (36.7 C)   Resp 17   SpO2 97%   Visual Acuity Right Eye Distance:   Left Eye Distance:   Bilateral Distance:    Right Eye Near:   Left Eye Near:    Bilateral Near:     Physical Exam Vitals and nursing note reviewed.  Constitutional:      Appearance: Normal appearance.  HENT:     Head: Atraumatic.     Mouth/Throat:     Mouth: Mucous membranes are moist.     Pharynx: Oropharynx is clear. No posterior oropharyngeal erythema.  Eyes:     Extraocular Movements: Extraocular movements intact.     Conjunctiva/sclera: Conjunctivae normal.  Cardiovascular:     Rate and Rhythm: Normal rate and regular rhythm.  Pulmonary:     Effort: Pulmonary effort is normal.     Breath sounds: Normal breath sounds. No wheezing or rales.  Musculoskeletal:        General: Swelling and tenderness present. Normal range of  motion.     Cervical back: Normal range of motion and neck supple.  Skin:    General: Skin is warm and dry.     Findings: Erythema present. No bruising.     Comments: Large area of erythema, edema to the left forearm with central sting site.  No fluctuance, induration, drainage or bleeding  Neurological:  General: No focal deficit present.     Mental Status: He is oriented to person, place, and time.     Comments: Left upper extremity neurovascularly intact  Psychiatric:        Mood and Affect: Mood normal.        Thought Content: Thought content normal.        Judgment: Judgment normal.      UC Treatments / Results  Labs (all labs ordered are listed, but only abnormal results are displayed) Labs Reviewed - No data to display  EKG   Radiology No results found.  Procedures Procedures (including critical care time)  Medications Ordered in UC Medications  methylPREDNISolone sodium succinate (SOLU-MEDROL) 125 mg/2 mL injection 80 mg (has no administration in time range)    Initial Impression / Assessment and Plan / UC Course  I have reviewed the triage vital signs and the nursing notes.  Pertinent labs & imaging results that were available during my care of the patient were reviewed by me and considered in my medical decision making (see chart for details).     Vitals and exam very reassuring, no evidence of a systemic reaction at this time.  Will treat with IM Solu-Medrol, antihistamines, ice, elevation.  Return for worsening symptoms.  Final Clinical Impressions(s) / UC Diagnoses   Final diagnoses:  Allergic reaction, initial encounter     Discharge Instructions      Take medication such as Zyrtec or Claritin daily until symptoms significantly improve.  Ice, elevate the area.  We have given you a steroid shot today to help additionally.  Follow-up for worsening symptoms.    ED Prescriptions   None    PDMP not reviewed this encounter.   Particia Nearing, [place] 06/13/23 1035

## 2023-06-13 NOTE — ED Triage Notes (Signed)
Pt presents with complaints of bee sting on Sunday to left forearm. Area is red and swollen. Pt has tried calamine gel at home with no relief. Swelling is getting worse.

## 2023-06-13 NOTE — Discharge Instructions (Signed)
Take medication such as Zyrtec or Claritin daily until symptoms significantly improve.  Ice, elevate the area.  We have given you a steroid shot today to help additionally.  Follow-up for worsening symptoms.

## 2023-07-05 DIAGNOSIS — E1122 Type 2 diabetes mellitus with diabetic chronic kidney disease: Secondary | ICD-10-CM | POA: Diagnosis not present

## 2023-07-05 DIAGNOSIS — N2581 Secondary hyperparathyroidism of renal origin: Secondary | ICD-10-CM | POA: Diagnosis not present

## 2023-07-05 DIAGNOSIS — E1129 Type 2 diabetes mellitus with other diabetic kidney complication: Secondary | ICD-10-CM | POA: Diagnosis not present

## 2023-07-05 DIAGNOSIS — N189 Chronic kidney disease, unspecified: Secondary | ICD-10-CM | POA: Diagnosis not present

## 2023-07-05 DIAGNOSIS — R809 Proteinuria, unspecified: Secondary | ICD-10-CM | POA: Diagnosis not present

## 2023-07-12 DIAGNOSIS — R809 Proteinuria, unspecified: Secondary | ICD-10-CM | POA: Diagnosis not present

## 2023-07-12 DIAGNOSIS — I129 Hypertensive chronic kidney disease with stage 1 through stage 4 chronic kidney disease, or unspecified chronic kidney disease: Secondary | ICD-10-CM | POA: Diagnosis not present

## 2023-07-12 DIAGNOSIS — N2581 Secondary hyperparathyroidism of renal origin: Secondary | ICD-10-CM | POA: Diagnosis not present

## 2023-07-28 ENCOUNTER — Encounter: Payer: Self-pay | Admitting: Orthopedic Surgery

## 2023-07-28 ENCOUNTER — Ambulatory Visit: Payer: Medicare HMO | Admitting: Orthopedic Surgery

## 2023-07-28 VITALS — BP 132/85 | HR 65 | Ht 71.0 in | Wt 212.0 lb

## 2023-07-28 DIAGNOSIS — M65322 Trigger finger, left index finger: Secondary | ICD-10-CM | POA: Diagnosis not present

## 2023-07-28 DIAGNOSIS — M6530 Trigger finger, unspecified finger: Secondary | ICD-10-CM

## 2023-07-28 MED ORDER — METHYLPREDNISOLONE ACETATE 40 MG/ML IJ SUSP
40.0000 mg | Freq: Once | INTRAMUSCULAR | Status: AC
Start: 2023-07-28 — End: 2023-07-28
  Administered 2023-07-28: 40 mg via INTRA_ARTICULAR

## 2023-07-28 NOTE — Addendum Note (Signed)
Addended by: Michaele Offer on: 07/28/2023 10:59 AM   Modules accepted: Orders

## 2023-07-28 NOTE — Progress Notes (Signed)
Office Visit Note   Patient: Bruce Adkins           Date of Birth: 09/09/1956           MRN: [id number] Visit Date: 07/28/2023 Requested by: Anabel Halon, MD [address] [place],  [place] [address] PCP: Anabel Halon, MD  Patient ID: Bruce Adkins, male   DOB: [birth year], 67 y.o.   MRN: [id number]  ASSESSMENT AND PLAN   Encounter Diagnosis  Name Primary?   Acquired trigger finger Yes    Left index finger trigger finger   Injection   Trigger finger injection  Diagnosis  LIF tenosynovitis Procedure injection A1 pulley Medications lidocaine 1% 1 mL and Depo-Medrol 40 mg 1 mL Skin prep alcohol and ethyl chloride Verbal consent was obtained Timeout confirmed the injection site  After cleaning the skin with alcohol and anesthetizing the skin with ethyl chloride the A1 pulley was palpated and the injection was performed without complication     Chief Complaint  Patient presents with   Hand Pain    Left trigger finger is freezing up for 3-4 months now     HPI Bruce Adkins is a 67 y.o. male.  Professional musician   New onset LIF triggering for several months   Review of Systems Review of Systems  Constitutional:  Negative for fever.  Respiratory:  Negative for shortness of breath.   Cardiovascular:  Negative for chest pain.  Skin: Negative.     Past Medical History:  Diagnosis Date   Arthritis    BPH with obstruction/lower urinary tract symptoms    CKD (chronic kidney disease)    DVT (deep venous thrombosis) (HCC) 2016   LLE   GERD (gastroesophageal reflux disease)    Hyperlipidemia    Hypertension    Panic attacks     Past Surgical History:  Procedure Laterality Date   BALLOON DILATION  12/30/2020   Procedure: BALLOON DILATION;  Surgeon: Dolores Frame, MD;  [facility];  Service: Gastroenterology;;   BIOPSY  12/30/2020   Procedure: BIOPSY;  Surgeon: Dolores Frame, MD;  [facility];  Service:  Gastroenterology;;   COLONOSCOPY     COLONOSCOPY WITH PROPOFOL N/A 12/30/2020   Procedure: COLONOSCOPY WITH PROPOFOL;  Surgeon: Dolores Frame, MD;  [facility];  Service: Gastroenterology;  Laterality: N/A;   ESOPHAGOGASTRODUODENOSCOPY (EGD) WITH PROPOFOL N/A 12/30/2020   Procedure: ESOPHAGOGASTRODUODENOSCOPY (EGD) WITH PROPOFOL;  Surgeon: Dolores Frame, MD;  [facility];  Service: Gastroenterology;  Laterality: N/A;  am   TRANSURETHRAL RESECTION OF PROSTATE N/A 03/10/2022   Procedure: TRANSURETHRAL RESECTION OF THE PROSTATE (TURP);  Surgeon: Marcine Matar, MD;  Location: [facility];  Service: Urology;  Laterality: N/A;    Family History  Problem Relation Age of Onset   Heart failure Mother    Hypertension Mother    Anxiety disorder Mother    Hypertension Father    Cancer Brother     Social History Social History   Tobacco Use   Smoking status: Never   Smokeless tobacco: Never  Vaping Use   Vaping status: Never Used  Substance Use Topics   Alcohol use: No   Drug use: Not Currently    No Known Allergies  Current Outpatient Medications  Medication Sig Dispense Refill   acetaminophen (TYLENOL) 500 MG tablet Take 1,000 mg by mouth every 6 (six) hours as needed for mild pain.  ALPRAZolam (XANAX) 0.25 MG tablet TAKE 1 TABLET BY MOUTH AT BEDTIME 30 tablet 0   atorvastatin (LIPITOR) 40 MG tablet Take 1 tablet (40 mg total) by mouth daily. 90 tablet 3   ELIQUIS 5 MG TABS tablet Take 1 tablet by mouth twice daily 180 tablet 0   fluticasone (FLONASE) 50 MCG/ACT nasal spray Place 2 sprays into both nostrils daily. 16 g 6   losartan (COZAAR) 25 MG tablet Take 1 tablet (25 mg total) by mouth daily. 90 tablet 1   Semaglutide,0.25 or 0.5MG /DOS, (OZEMPIC, 0.25 OR 0.5 MG/DOSE,) 2 MG/3ML SOPN Inject 0.25 mg into the skin every 7 (seven) days for 28 days, THEN 0.5 mg every 7 (seven) days. 3 mL 1   tadalafil (CIALIS) 20 MG  tablet 1/2 to 1 tab po prn 30 tablet 11   finasteride (PROSCAR) 5 MG tablet Take 1 tablet (5 mg total) by mouth daily. 90 tablet 3   No current facility-administered medications for this visit.     Physical Exam BP 132/85   Pulse 65   Ht 5\' 11"  (1.803 m)   Wt 212 lb (96.2 kg)   BMI 29.57 kg/m  Body mass index is 29.57 kg/m.  Appearance, there are no abnormalities in terms of appearance the patient was well-developed and well-nourished. The grooming and hygiene were normal.  Mental status orientation, there was normal alertness and orientation Mood pleasant Ambulatory status normal with no assistive devices  Examination of the left hand reveals tenderness over the A1 pulley of the left index finger Range of motion remains normal with clicking on flexion extension.  Stability tests show no abnormality of the IP joints are MP joint. The FDP and FDS strength is normal Skin warm dry and intact without laceration or ulceration or erythema Neurologic examination normal sensation Vascular examination normal pulses with warm extremity and normal capillary refill    MEDICAL DECISION MAKING  A.  Encounter Diagnosis  Name Primary?   Acquired trigger finger Yes    B. DATA ANALYSED:    IMAGING: Independent interpretation of images: none  Orders: injex  Outside records reviewed: no  C. MANAGEMENT injection   No orders of the defined types were placed in this encounter.

## 2023-07-30 ENCOUNTER — Other Ambulatory Visit: Payer: Self-pay | Admitting: Internal Medicine

## 2023-07-30 DIAGNOSIS — I82409 Acute embolism and thrombosis of unspecified deep veins of unspecified lower extremity: Secondary | ICD-10-CM

## 2023-08-03 ENCOUNTER — Other Ambulatory Visit: Payer: Self-pay | Admitting: Internal Medicine

## 2023-08-03 DIAGNOSIS — N1832 Chronic kidney disease, stage 3b: Secondary | ICD-10-CM

## 2023-08-03 MED ORDER — OZEMPIC (0.25 OR 0.5 MG/DOSE) 2 MG/3ML ~~LOC~~ SOPN: 0.5000 mg | PEN_INJECTOR | SUBCUTANEOUS | 0 refills | Status: AC

## 2023-08-03 NOTE — Telephone Encounter (Signed)
Copied from CRM [id number]. Topic: Clinical - Medication Refill >> Aug 03, 2023  8:47 AM Desma Mcgregor wrote: Most Recent Primary Care Visit:  Provider: Anabel Halon  Department: [facility]  Visit Type: PHYSICAL  Date: 05/17/2023  Medication: Semaglutide,0.25 or 0.5MG /DOS, (OZEMPIC, 0.25 OR 0.5 MG/DOSE,) 2 MG/3ML SOPN   Has the patient contacted their pharmacy? Yes, there are no more refills   Is this the correct pharmacy for this prescription? Yes If no, delete pharmacy and type the correct one.  This is the patient's preferred pharmacy:  [facility] [address] [place] - [address] [place] [address] [place] [address] Phone: [phone] Fax: [phone]  Has the prescription been filled recently? Yes  Is the patient out of the medication? No, has one left  Has the patient been seen for an appointment in the last year OR does the patient have an upcoming appointment? Yes  Can we respond through MyChart? Yes  Agent: Please be advised that Rx refills may take up to 3 business days. We ask that you follow-up with your pharmacy.  Note: Patient looking to get coupons to assist with the costs.

## 2023-08-14 NOTE — Progress Notes (Signed)
History of Present Illness:   Bruce Adkins is here for continued attention to BPH.   4.26.2022: Initial presentation w/ AUR w/ (B) hydro, resolved with catheter placement. Multiple voiding trial on med Rx unsuccessful.   9.26.2022: Urolift performed (TURP recommended). Prostate volume 47 mL. 7 implants placed.   12.6.2022: He is self catheterizing 3 times a day.  He does have spontaneous voids, usually early in the morning and later in the daytime.  In between, he does not void that much.  No problems with catheterizing, no blood in the urine, no dysuria.  Renal function is stable, followed by Dr. Wolfgang Phoenix.  Follow-up renal ultrasound revealed no residual hydronephrosis.  There was bladder wall thickening.   3.28.2023: Here for recheck.  He still is on self-catheterization on average about 5 times every 24 hours.  No problems with catheterization.  He has had no significantly cloudy urine or blood.  He has rare spontaneous voids but he does have occasional feelings of urgency.   He underwent urodynamics.  There is obstruction.  He has mild to moderate contractility of the bladder.   TURP performed on 6.15.2023.  Pathology benign.  He did not void well postoperatively and did self-catheterization for some time.      Past Medical History:  Diagnosis Date   Arthritis    BPH with obstruction/lower urinary tract symptoms    CKD (chronic kidney disease)    DVT (deep venous thrombosis) (HCC) 2016   LLE   GERD (gastroesophageal reflux disease)    Hyperlipidemia    Hypertension    Panic attacks     Past Surgical History:  Procedure Laterality Date   BALLOON DILATION  12/30/2020   Procedure: BALLOON DILATION;  Surgeon: Dolores Frame, MD;  [facility];  Service: Gastroenterology;;   BIOPSY  12/30/2020   Procedure: BIOPSY;  Surgeon: Dolores Frame, MD;  [facility];  Service: Gastroenterology;;   COLONOSCOPY     COLONOSCOPY WITH PROPOFOL N/A 12/30/2020    Procedure: COLONOSCOPY WITH PROPOFOL;  Surgeon: Dolores Frame, MD;  [facility];  Service: Gastroenterology;  Laterality: N/A;   ESOPHAGOGASTRODUODENOSCOPY (EGD) WITH PROPOFOL N/A 12/30/2020   Procedure: ESOPHAGOGASTRODUODENOSCOPY (EGD) WITH PROPOFOL;  Surgeon: Dolores Frame, MD;  [facility];  Service: Gastroenterology;  Laterality: N/A;  am   TRANSURETHRAL RESECTION OF PROSTATE N/A 03/10/2022   Procedure: TRANSURETHRAL RESECTION OF THE PROSTATE (TURP);  Surgeon: Marcine Matar, MD;  Location: [facility];  Service: Urology;  Laterality: N/A;    Home Medications:  Allergies as of 08/15/2023   No Known Allergies      Medication List        Accurate as of August 14, 2023  9:15 AM. If you have any questions, ask your nurse or doctor.          acetaminophen 500 MG tablet Commonly known as: TYLENOL Take 1,000 mg by mouth every 6 (six) hours as needed for mild pain.   ALPRAZolam 0.25 MG tablet Commonly known as: XANAX TAKE 1 TABLET BY MOUTH AT BEDTIME   atorvastatin 40 MG tablet Commonly known as: LIPITOR Take 1 tablet (40 mg total) by mouth daily.   Eliquis 5 MG Tabs tablet Generic drug: apixaban Take 1 tablet by mouth twice daily   finasteride 5 MG tablet Commonly known as: PROSCAR Take 1 tablet (5 mg total) by mouth daily.   fluticasone 50 MCG/ACT nasal spray Commonly known as: FLONASE Place 2 sprays into  both nostrils daily.   losartan 25 MG tablet Commonly known as: COZAAR Take 1 tablet (25 mg total) by mouth daily.   Ozempic (0.25 or 0.5 MG/DOSE) 2 MG/3ML Sopn Generic drug: Semaglutide(0.25 or 0.5MG /DOS) Inject 0.5 mg into the skin every 7 (seven) days for 28 days.   tadalafil 20 MG tablet Commonly known as: CIALIS 1/2 to 1 tab po prn        Allergies: No Known Allergies  Family History  Problem Relation Age of Onset   Heart failure Mother    Hypertension Mother    Anxiety disorder  Mother    Hypertension Father    Cancer Brother     Social History:  reports that he has never smoked. He has never used smokeless tobacco. He reports that he does not currently use drugs. He reports that he does not drink alcohol.  ROS: A complete review of systems was performed.  All systems are negative except for pertinent findings as noted.  Physical Exam:  Vital signs in last 24 hours: There were no vitals taken for this visit. Constitutional:  Alert and oriented, No acute distress Cardiovascular: Regular rate  Respiratory: Normal respiratory effort Psychiatric: Normal mood and affect  I have reviewed prior pt notes  I have reviewed urinalysis results  I have independently reviewed prior imaging  I have reviewed prior PSA results  I reviewed residual urine volume/bladder scan-76 mL Impression/Assessment:  BPH w/ retention--excellent symptomatic response to eventual TURP  Plan:  I will have him come back in 1 year for recheck

## 2023-08-15 ENCOUNTER — Ambulatory Visit: Payer: Medicare HMO | Admitting: Urology

## 2023-08-15 VITALS — BP 126/85 | HR 70 | Ht 71.0 in | Wt 221.0 lb

## 2023-08-15 DIAGNOSIS — N401 Enlarged prostate with lower urinary tract symptoms: Secondary | ICD-10-CM

## 2023-08-15 DIAGNOSIS — N1832 Chronic kidney disease, stage 3b: Secondary | ICD-10-CM | POA: Diagnosis not present

## 2023-08-15 DIAGNOSIS — R339 Retention of urine, unspecified: Secondary | ICD-10-CM | POA: Diagnosis not present

## 2023-08-15 DIAGNOSIS — E785 Hyperlipidemia, unspecified: Secondary | ICD-10-CM | POA: Diagnosis not present

## 2023-08-15 DIAGNOSIS — N138 Other obstructive and reflux uropathy: Secondary | ICD-10-CM | POA: Diagnosis not present

## 2023-08-15 DIAGNOSIS — E1122 Type 2 diabetes mellitus with diabetic chronic kidney disease: Secondary | ICD-10-CM | POA: Diagnosis not present

## 2023-08-15 LAB — URINALYSIS, ROUTINE W REFLEX MICROSCOPIC
Bilirubin, UA: NEGATIVE
Glucose, UA: NEGATIVE
Ketones, UA: NEGATIVE
Leukocytes,UA: NEGATIVE
Nitrite, UA: NEGATIVE
Protein,UA: NEGATIVE
RBC, UA: NEGATIVE
Specific Gravity, UA: 1.02 (ref 1.005–1.030)
Urobilinogen, Ur: 0.2 mg/dL (ref 0.2–1.0)
pH, UA: 6.5 (ref 5.0–7.5)

## 2023-08-16 LAB — LIPID PANEL
Chol/HDL Ratio: 2.9 {ratio} (ref 0.0–5.0)
Cholesterol, Total: 119 mg/dL (ref 100–199)
HDL: 41 mg/dL (ref >39)
LDL Chol Calc (NIH): 62 mg/dL (ref 0–99)
Triglycerides: 80 mg/dL (ref 0–149)
VLDL Cholesterol Cal: 16 mg/dL (ref 5–40)

## 2023-08-16 LAB — CMP14+EGFR
ALT: 19 [IU]/L (ref 0–44)
AST: 25 [IU]/L (ref 0–40)
Albumin: 4.4 g/dL (ref 3.9–4.9)
Alkaline Phosphatase: 97 [IU]/L (ref 44–121)
BUN/Creatinine Ratio: 10 (ref 10–24)
BUN: 18 mg/dL (ref 8–27)
Bilirubin Total: 0.4 mg/dL (ref 0.0–1.2)
CO2: 24 mmol/L (ref 20–29)
Calcium: 9.3 mg/dL (ref 8.6–10.2)
Chloride: 104 mmol/L (ref 96–106)
Creatinine, Ser: 1.82 mg/dL — ABNORMAL HIGH (ref 0.76–1.27)
Globulin, Total: 2.7 g/dL (ref 1.5–4.5)
Glucose: 90 mg/dL (ref 70–99)
Potassium: 5.3 mmol/L — ABNORMAL HIGH (ref 3.5–5.2)
Sodium: 139 mmol/L (ref 134–144)
Total Protein: 7.1 g/dL (ref 6.0–8.5)
eGFR: 40 mL/min/{1.73_m2} — ABNORMAL LOW (ref >59)

## 2023-08-16 LAB — HEMOGLOBIN A1C
Est. average glucose Bld gHb Est-mCnc: 134 mg/dL
Hgb A1c MFr Bld: 6.3 % — ABNORMAL HIGH (ref 4.8–5.6)

## 2023-08-17 ENCOUNTER — Encounter: Payer: Self-pay | Admitting: Internal Medicine

## 2023-08-17 ENCOUNTER — Ambulatory Visit: Payer: Medicare HMO | Admitting: Internal Medicine

## 2023-08-17 VITALS — BP 107/72 | HR 82 | Ht 71.0 in | Wt 207.4 lb

## 2023-08-17 DIAGNOSIS — N1832 Chronic kidney disease, stage 3b: Secondary | ICD-10-CM

## 2023-08-17 DIAGNOSIS — I10 Essential (primary) hypertension: Secondary | ICD-10-CM

## 2023-08-17 DIAGNOSIS — Z7985 Long-term (current) use of injectable non-insulin antidiabetic drugs: Secondary | ICD-10-CM

## 2023-08-17 DIAGNOSIS — F419 Anxiety disorder, unspecified: Secondary | ICD-10-CM

## 2023-08-17 DIAGNOSIS — E1122 Type 2 diabetes mellitus with diabetic chronic kidney disease: Secondary | ICD-10-CM | POA: Diagnosis not present

## 2023-08-17 MED ORDER — SEMAGLUTIDE (1 MG/DOSE) 4 MG/3ML ~~LOC~~ SOPN: 1.0000 mg | PEN_INJECTOR | SUBCUTANEOUS | 3 refills | Status: DC

## 2023-08-17 NOTE — Assessment & Plan Note (Signed)
BP Readings from Last 1 Encounters:  08/17/23 107/72   Well-controlled with losartan 25 mg daily Counseled for compliance with the medications Advised DASH diet and moderate exercise/walking, at least 150 mins/week

## 2023-08-17 NOTE — Progress Notes (Signed)
Established Patient Office Visit  Subjective:  Patient ID: Bruce Adkins, male    DOB: 05/12/1956  Age: 67 y.o. MRN: [id number]  CC:  Chief Complaint  Patient presents with   Diabetes    Three month follow up    Chronic Kidney Disease    Three month follow up     HPI Bruce Adkins is a 67 y.o. male with past medical history of recurrent unprovoked DVT, HTN, type 2 DM, BPH, HLD, anxiety and seasonal allergies who presents for f/u of his chronic medical conditions.  HTN: He has been taking losartan 25 mg QD now. BP is well-controlled. Takes medications regularly. Patient denies headache, dizziness, chest pain, dyspnea or palpitations.  His cough has resolved since stopping lisinopril.   Type 2 DM: His HbA1c is 6.3, improved from 6.5 in 07/24. He has started taking Ozempic, and has been tolerating it except mild nausea. He has lost 13 lbs since the last visit.  He denies any polyuria, polydipsia or chronic fatigue currently.  CKD: Followed by Nephrology. Denies any dysuria or hematuria.  BPH: Had Urolift procedure, but still had urinary retention.  He had TURP in 06/23.  He denies urinary retention, hesitance, resistance now. Denies any dysuria or hematuria currently.  He has stopped taking finasteride.  He has had erectile dysfunction since TURP, but take Cialis for it.   Past Medical History:  Diagnosis Date   Arthritis    BPH with obstruction/lower urinary tract symptoms    CKD (chronic kidney disease)    DVT (deep venous thrombosis) (HCC) 2016   LLE   GERD (gastroesophageal reflux disease)    Hyperlipidemia    Hypertension    Panic attacks     Past Surgical History:  Procedure Laterality Date   BALLOON DILATION  12/30/2020   Procedure: BALLOON DILATION;  Surgeon: Dolores Frame, MD;  [facility];  Service: Gastroenterology;;   BIOPSY  12/30/2020   Procedure: BIOPSY;  Surgeon: Dolores Frame, MD;  [facility];  Service:  Gastroenterology;;   COLONOSCOPY     COLONOSCOPY WITH PROPOFOL N/A 12/30/2020   Procedure: COLONOSCOPY WITH PROPOFOL;  Surgeon: Dolores Frame, MD;  [facility];  Service: Gastroenterology;  Laterality: N/A;   ESOPHAGOGASTRODUODENOSCOPY (EGD) WITH PROPOFOL N/A 12/30/2020   Procedure: ESOPHAGOGASTRODUODENOSCOPY (EGD) WITH PROPOFOL;  Surgeon: Dolores Frame, MD;  [facility];  Service: Gastroenterology;  Laterality: N/A;  am   TRANSURETHRAL RESECTION OF PROSTATE N/A 03/10/2022   Procedure: TRANSURETHRAL RESECTION OF THE PROSTATE (TURP);  Surgeon: Marcine Matar, MD;  Location: [facility];  Service: Urology;  Laterality: N/A;    Family History  Problem Relation Age of Onset   Heart failure Mother    Hypertension Mother    Anxiety disorder Mother    Hypertension Father    Cancer Brother     Social History   Socioeconomic History   Marital status: Married    Spouse name: Lorraine Rathgeb   Number of children: 3   Years of education: Not on file   Highest education level: GED or equivalent  Occupational History   Not on file  Tobacco Use   Smoking status: Never   Smokeless tobacco: Never  Vaping Use   Vaping status: Never Used  Substance and Sexual Activity   Alcohol use: No   Drug use: Not Currently   Sexual activity: Yes  Other Topics Concern   Not on file  Social  History Narrative   Not on file   Social Determinants of Health   Financial Resource Strain: Low Risk  (12/20/2022)   Overall Financial Resource Strain (CARDIA)    Difficulty of Paying Living Expenses: Not hard at all  Food Insecurity: No Food Insecurity (12/20/2022)   Hunger Vital Sign    Worried About Running Out of Food in the Last Year: Never true    Ran Out of Food in the Last Year: Never true  Transportation Needs: No Transportation Needs (12/20/2022)   PRAPARE - Administrator, Civil Service (Medical): No    Lack of Transportation  (Non-Medical): No  Physical Activity: Unknown (12/20/2022)   Exercise Vital Sign    Days of Exercise per Week: 0 days    Minutes of Exercise per Session: Not on file  Stress: Stress Concern Present (12/20/2022)   [employer] of Occupational Health - Occupational Stress Questionnaire    Feeling of Stress : To some extent  Social Connections: Unknown (12/20/2022)   Social Connection and Isolation Panel [NHANES]    Frequency of Communication with Friends and Family: Patient declined    Frequency of Social Gatherings with Friends and Family: Patient declined    Attends Religious Services: More than 4 times per year    Active Member of [employer] or Organizations: Patient declined    Attends Banker Meetings: Not on file    Marital Status: Married  Intimate Partner Violence: Not At Risk (09/08/2020)   Humiliation, Afraid, Rape, and Kick questionnaire    Fear of Current or Ex-Partner: No    Emotionally Abused: No    Physically Abused: No    Sexually Abused: No    Outpatient Medications Prior to Visit  Medication Sig Dispense Refill   acetaminophen (TYLENOL) 500 MG tablet Take 1,000 mg by mouth every 6 (six) hours as needed for mild pain.     ALPRAZolam (XANAX) 0.25 MG tablet TAKE 1 TABLET BY MOUTH AT BEDTIME 30 tablet 0   atorvastatin (LIPITOR) 40 MG tablet Take 1 tablet (40 mg total) by mouth daily. 90 tablet 3   ELIQUIS 5 MG TABS tablet Take 1 tablet by mouth twice daily 180 tablet 0   fluticasone (FLONASE) 50 MCG/ACT nasal spray Place 2 sprays into both nostrils daily. 16 g 6   losartan (COZAAR) 25 MG tablet Take 1 tablet (25 mg total) by mouth daily. 90 tablet 1   Semaglutide,0.25 or 0.5MG /DOS, (OZEMPIC, 0.25 OR 0.5 MG/DOSE,) 2 MG/3ML SOPN Inject 0.5 mg into the skin every 7 (seven) days for 28 days. 3 mL 0   tadalafil (CIALIS) 20 MG tablet 1/2 to 1 tab po prn 30 tablet 11   finasteride (PROSCAR) 5 MG tablet Take 1 tablet (5 mg total) by mouth daily. 90 tablet 3   No  facility-administered medications prior to visit.    No Known Allergies  ROS Review of Systems  Constitutional:  Negative for chills and fever.  HENT:  Negative for congestion and sore throat.   Eyes:  Negative for pain and discharge.  Respiratory:  Negative for cough and shortness of breath.   Cardiovascular:  Negative for chest pain and palpitations.  Gastrointestinal:  Negative for constipation, diarrhea and vomiting.  Endocrine: Negative for polydipsia and polyuria.  Genitourinary:  Positive for difficulty urinating. Negative for dysuria and hematuria.  Musculoskeletal:  Positive for arthralgias (L shoulder) and back pain. Negative for neck pain and neck stiffness.  Skin:  Negative for rash.  Neurological:  Negative for dizziness, weakness, numbness and headaches.  Psychiatric/Behavioral:  Negative for agitation and behavioral problems.       Objective:    Physical Exam Vitals reviewed.  Constitutional:      General: He is not in acute distress.    Appearance: He is not diaphoretic.  HENT:     Head: Normocephalic and atraumatic.     Nose: Nose normal.     Mouth/Throat:     Mouth: Mucous membranes are moist.  Eyes:     General: No scleral icterus.    Extraocular Movements: Extraocular movements intact.  Cardiovascular:     Rate and Rhythm: Normal rate and regular rhythm.     Pulses: Normal pulses.     Heart sounds: Normal heart sounds. No murmur heard. Pulmonary:     Breath sounds: Normal breath sounds. No wheezing or rales.  Musculoskeletal:     Cervical back: Neck supple. No tenderness.     Right lower leg: No edema.     Left lower leg: No edema.  Skin:    General: Skin is warm.     Findings: No rash.  Neurological:     General: No focal deficit present.     Mental Status: He is alert and oriented to person, place, and time.     Sensory: No sensory deficit.     Motor: No weakness.  Psychiatric:        Mood and Affect: Mood normal.        Behavior:  Behavior normal.     BP 107/72 (BP Location: Right Arm, Patient Position: Sitting, Cuff Size: Large)   Pulse 82   Ht 5\' 11"  (1.803 m)   Wt 207 lb 6.4 oz (94.1 kg)   SpO2 97%   BMI 28.93 kg/m  Wt Readings from Last 3 Encounters:  08/17/23 207 lb 6.4 oz (94.1 kg)  08/15/23 221 lb (100.2 kg)  07/28/23 212 lb (96.2 kg)    Lab Results  Component Value Date   TSH 0.714 12/21/2022   Lab Results  Component Value Date   WBC 4.1 12/21/2022   HGB 14.3 12/21/2022   HCT 43.0 12/21/2022   MCV 94 12/21/2022   PLT 207 12/21/2022   Lab Results  Component Value Date   NA 139 08/15/2023   K 5.3 (H) 08/15/2023   CO2 24 08/15/2023   GLUCOSE 90 08/15/2023   BUN 18 08/15/2023   CREATININE 1.82 (H) 08/15/2023   BILITOT 0.4 08/15/2023   ALKPHOS 97 08/15/2023   AST 25 08/15/2023   ALT 19 08/15/2023   PROT 7.1 08/15/2023   ALBUMIN 4.4 08/15/2023   CALCIUM 9.3 08/15/2023   ANIONGAP 8 06/13/2021   EGFR 40 (L) 08/15/2023   Lab Results  Component Value Date   CHOL 119 08/15/2023   Lab Results  Component Value Date   HDL 41 08/15/2023   Lab Results  Component Value Date   LDLCALC 62 08/15/2023   Lab Results  Component Value Date   TRIG 80 08/15/2023   Lab Results  Component Value Date   CHOLHDL 2.9 08/15/2023   Lab Results  Component Value Date   HGBA1C 6.3 (H) 08/15/2023      Assessment & Plan:   Problem List Items Addressed This Visit       Cardiovascular and Mediastinum   Essential hypertension    BP Readings from Last 1 Encounters:  08/17/23 107/72   Well-controlled with losartan 25 mg daily Counseled for compliance with the medications  Advised DASH diet and moderate exercise/walking, at least 150 mins/week        Endocrine   Type 2 diabetes mellitus with stage 3b chronic kidney disease, without long-term current use of insulin (HCC) - Primary    Lab Results  Component Value Date   HGBA1C 6.3 (H) 08/15/2023   Well-controlled Advised to follow  diabetic diet Started Ozempic considering his history of CKD - plan to increase dose as tolerated, 1 mg dose sent today F/u CMP and lipid panel Diabetic eye exam: Advised to follow up with Ophthalmology for diabetic eye exam      Relevant Medications   Semaglutide, 1 MG/DOSE, 4 MG/3ML SOPN (Start on 08/28/2023)     Genitourinary   Stage 3b chronic kidney disease (HCC)    Last BMP reviewed, GFR stays around 40-45 Followed by Nephrology Advised to avoid oral NSAIDs On losartan Was on Sod Bicarb tablets - needs nephrology follow-up        Other   Anxiety    Takes Xanax PRN, needs it occasionally - refilled PDMP reviewed       Meds ordered this encounter  Medications   Semaglutide, 1 MG/DOSE, 4 MG/3ML SOPN    Sig: Inject 1 mg as directed once a week.    Dispense:  3 mL    Refill:  3    Follow-up: Return in about 4 months (around 12/15/2023) for DM.    Anabel Halon, MD

## 2023-08-17 NOTE — Patient Instructions (Signed)
Please continue taking Ozempic 0.5 mg until you complete your supplies and then increase dose to 1 mg once weekly.  Please take small, frequent meals to avoid nausea from medicine.  Please continue to take medications as prescribed.  Please continue to follow low carb diet and perform moderate exercise/walking at least 150 mins/week.

## 2023-08-17 NOTE — Assessment & Plan Note (Addendum)
Last BMP reviewed, GFR stays around 40-45 Followed by Nephrology Advised to avoid oral NSAIDs On losartan Was on Sod Bicarb tablets - needs nephrology follow-up

## 2023-08-17 NOTE — Assessment & Plan Note (Signed)
Lab Results  Component Value Date   HGBA1C 6.3 (H) 08/15/2023   Well-controlled Advised to follow diabetic diet Started Ozempic considering his history of CKD - plan to increase dose as tolerated, 1 mg dose sent today F/u CMP and lipid panel Diabetic eye exam: Advised to follow up with Ophthalmology for diabetic eye exam

## 2023-08-17 NOTE — Assessment & Plan Note (Signed)
Takes Xanax PRN, needs it occasionally - refilled PDMP reviewed

## 2023-08-21 ENCOUNTER — Ambulatory Visit: Payer: Medicare HMO

## 2023-09-18 ENCOUNTER — Ambulatory Visit (INDEPENDENT_AMBULATORY_CARE_PROVIDER_SITE_OTHER): Payer: Medicare HMO | Admitting: Internal Medicine

## 2023-09-18 ENCOUNTER — Encounter: Payer: Self-pay | Admitting: Internal Medicine

## 2023-09-18 ENCOUNTER — Telehealth: Payer: Self-pay

## 2023-09-18 VITALS — BP 124/78 | HR 85 | Ht 71.0 in | Wt 204.8 lb

## 2023-09-18 DIAGNOSIS — B353 Tinea pedis: Secondary | ICD-10-CM | POA: Insufficient documentation

## 2023-09-18 MED ORDER — TERBINAFINE HCL 250 MG PO TABS
250.0000 mg | ORAL_TABLET | Freq: Every day | ORAL | 0 refills | Status: AC
Start: 2023-09-18 — End: 2023-10-02

## 2023-09-18 MED ORDER — CLOTRIMAZOLE 1 % EX CREA
1.0000 | TOPICAL_CREAM | Freq: Two times a day (BID) | CUTANEOUS | 0 refills | Status: DC
Start: 2023-09-18 — End: 2024-04-16

## 2023-09-18 NOTE — Telephone Encounter (Signed)
Copied from CRM [id number]. Topic: Clinical - Medication Question >> Sep 18, 2023  9:28 AM Gildardo Pounds wrote: Reason for CRM: Jino issues with left foot. 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 OTC meds are not working. Callback number [phone]

## 2023-09-18 NOTE — Progress Notes (Signed)
   Acute Office Visit  Subjective:     Patient ID: Bruce Adkins, male    DOB: Jan 21, 1956, 67 y.o.   MRN: [id number]  Chief Complaint  Patient presents with   athletes foot    Athletes foot over the counter medication is not helping    Mr. Bruce Adkins has been evaluated today for an acute visit endorsing left foot discomfort and itching rash for the last 2 weeks.  He has noticed a white rash between his toes with associated burning and itching.  He suspected athlete's foot and has tried applying Tinactin spray.  This has mildly improved his symptoms but has not alleviated the rash.  He is interested in additional treatment options today.  Denies noticing a rash or any discomfort in the right foot.  Review of Systems  Skin:  Positive for itching and rash (Left foot with associated burning/itching).      Objective:    BP 124/78 (BP Location: Left Arm, Patient Position: Sitting, Cuff Size: Normal)   Pulse 85   Ht 5\' 11"  (1.803 m)   Wt 204 lb 12.8 oz (92.9 kg)   SpO2 96%   BMI 28.56 kg/m   Physical Exam Skin:    Findings: Rash (Tinea pedis left foot) present.       Assessment & Plan:   Problem List Items Addressed This Visit       Tinea pedis of left foot - Primary   Presenting today for acute visit endorsing a burning/itching rash of the left foot x 2 weeks.  On exam there is a white rash with evidence of excoriation between his toes. -Terbinafine 250 mg daily x 2 weeks prescribed for treatment of tinea pedis.  I have added clotrimazole 1% cream for twice daily application as well.  He was instructed to return to care if symptoms worsen or fail to improve.  Otherwise, he is scheduled for routine follow-up with Dr. Allena Adkins in March.       Meds ordered this encounter  Medications   clotrimazole (LOTRIMIN) 1 % cream    Sig: Apply 1 Application topically 2 (two) times daily.    Dispense:  30 g    Refill:  0   terbinafine (LAMISIL) 250 MG tablet    Sig: Take 1 tablet (250 mg  total) by mouth daily for 14 days.    Dispense:  14 tablet    Refill:  0    Return if symptoms worsen or fail to improve.  Bruce Lade, MD

## 2023-09-18 NOTE — Patient Instructions (Signed)
It was a pleasure to see you today.  Thank you for giving [place] the opportunity to be involved in your care.  Below is a brief recap of your visit and next steps.  We will plan to see you again in March.  Summary Terbinafine 250 mg daily x 14 days and Lotrimin cream prescribed today for treatment of athletes foot Follow up as scheduled with Dr. Allena Katz in March

## 2023-09-18 NOTE — Telephone Encounter (Signed)
Acute visit scheduled with Bruce Adkins , pt aware

## 2023-09-18 NOTE — Assessment & Plan Note (Signed)
Presenting today for acute visit endorsing a burning/itching rash of the left foot x 2 weeks.  On exam there is a white rash with evidence of excoriation between his toes. -Terbinafine 250 mg daily x 2 weeks prescribed for treatment of tinea pedis.  I have added clotrimazole 1% cream for twice daily application as well.  He was instructed to return to care if symptoms worsen or fail to improve.  Otherwise, he is scheduled for routine follow-up with Dr. Allena Katz in March.

## 2023-09-23 IMAGING — CR DG LUMBAR SPINE COMPLETE 4+V
5 series · 5 of 5 positions shown · non-contrast
Comparison: None.

CLINICAL DATA: Chronic low back pain common no known injury,
initial encounter

EXAM:
LUMBAR SPINE - COMPLETE 4+ VIEW

[t  ap]
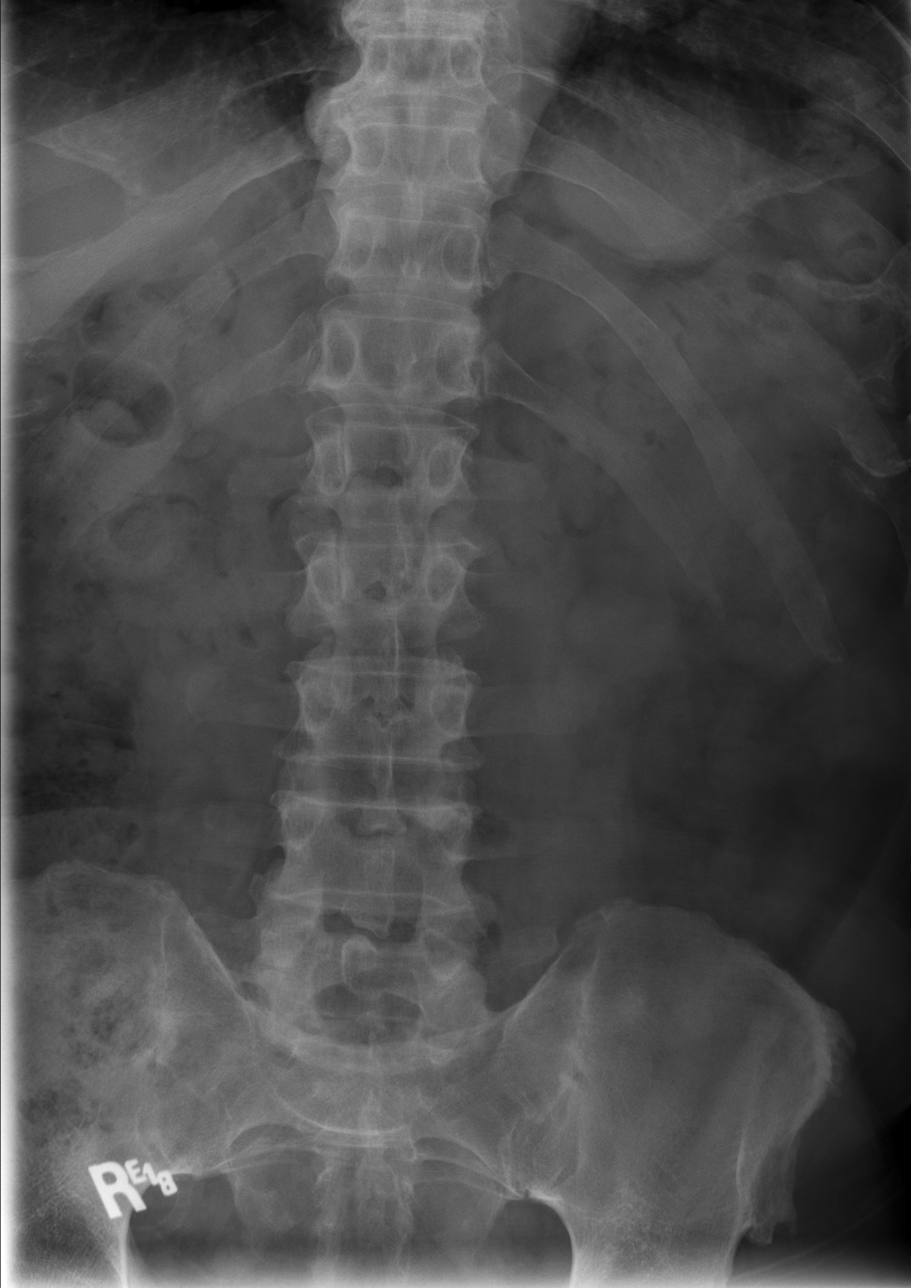

[t lpo (1 of 3)]
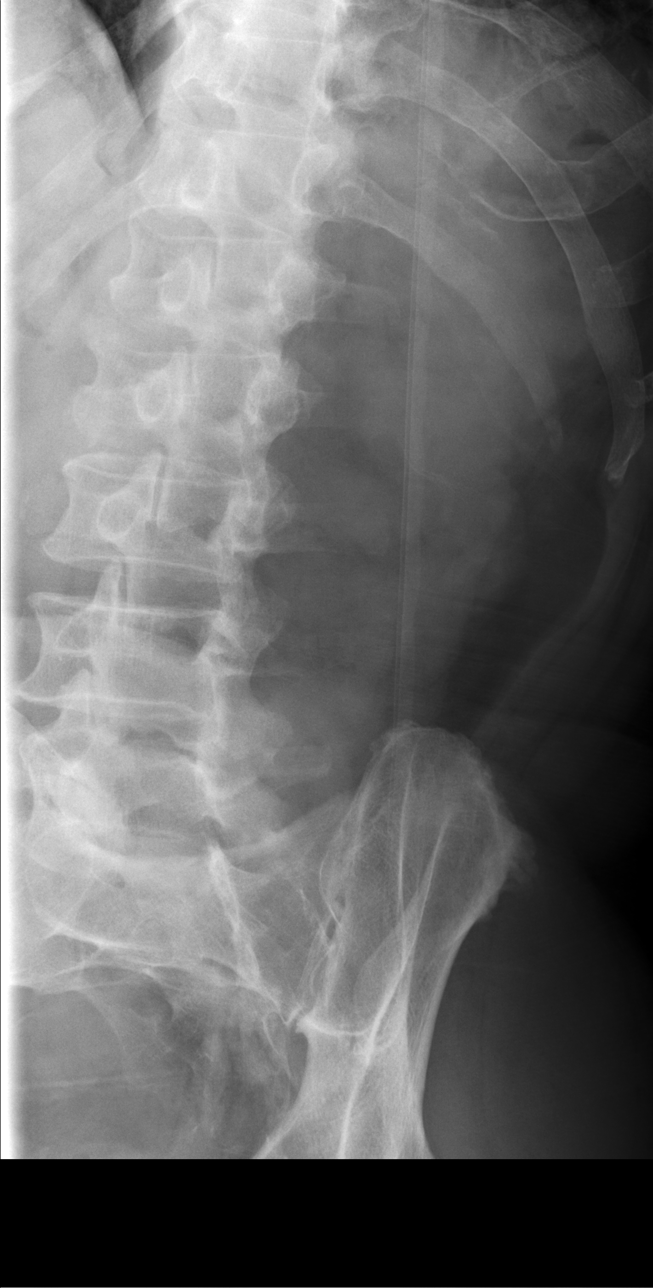

[t lpo (2 of 3)]
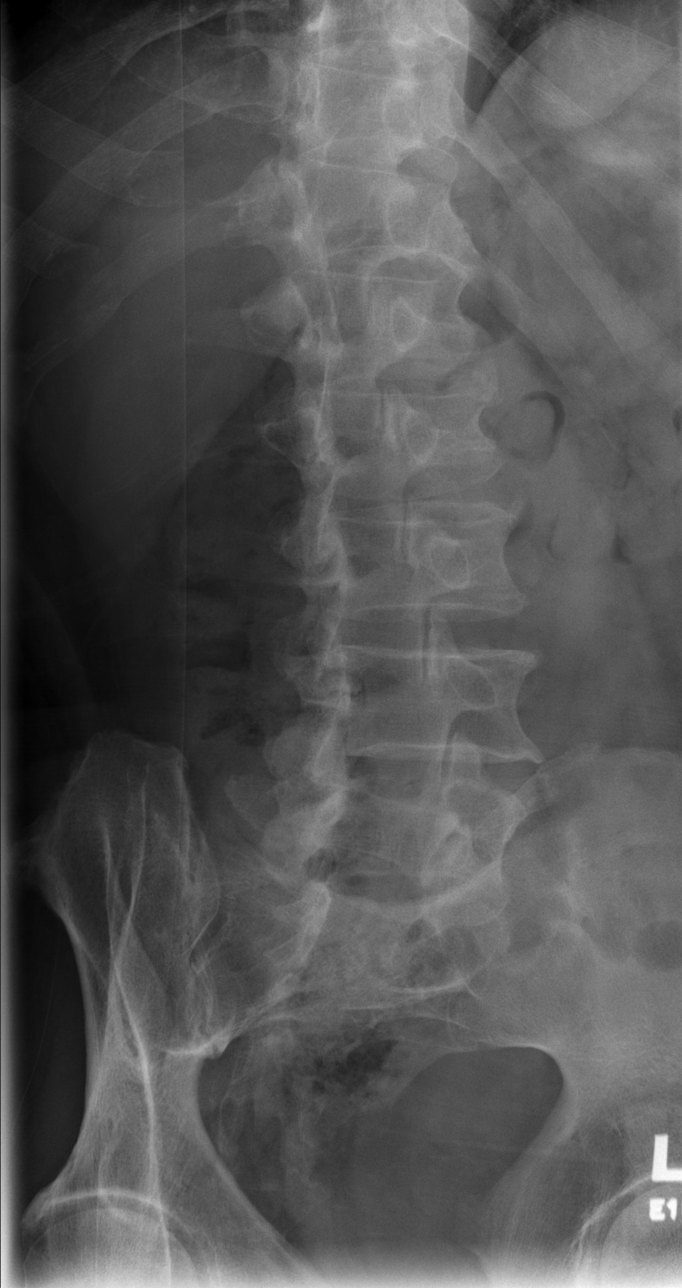

[t lpo (3 of 3)]
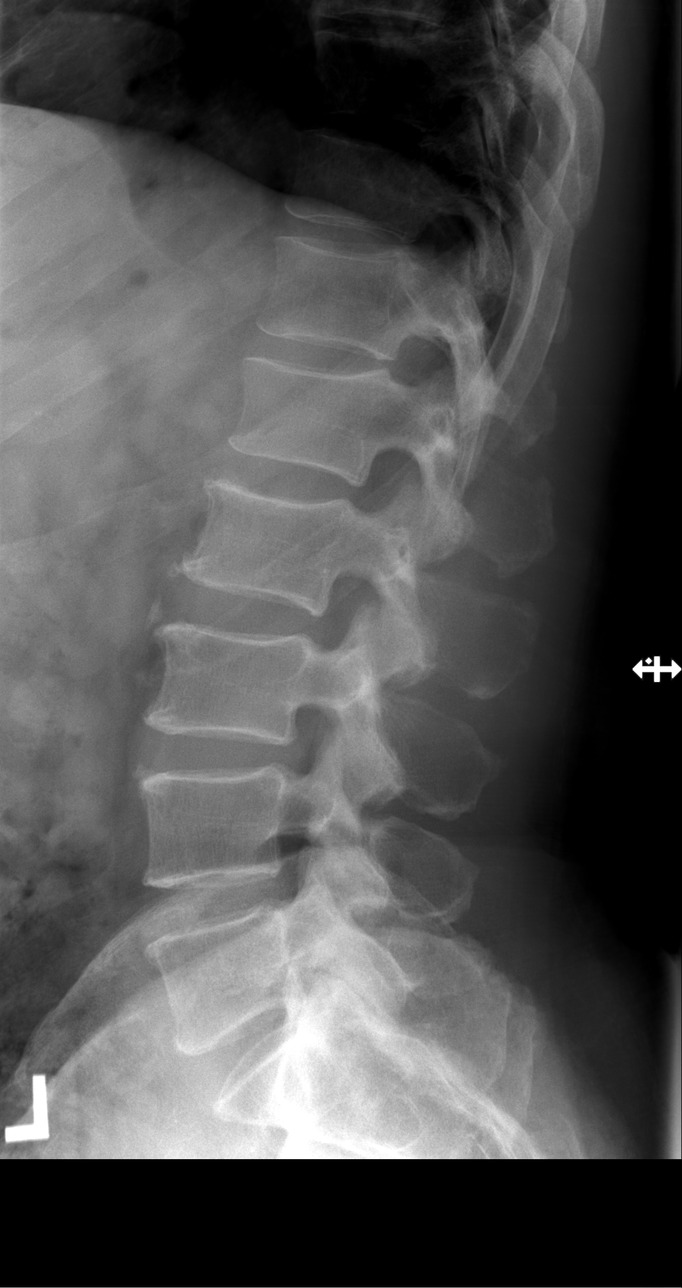

[t spot lateral]
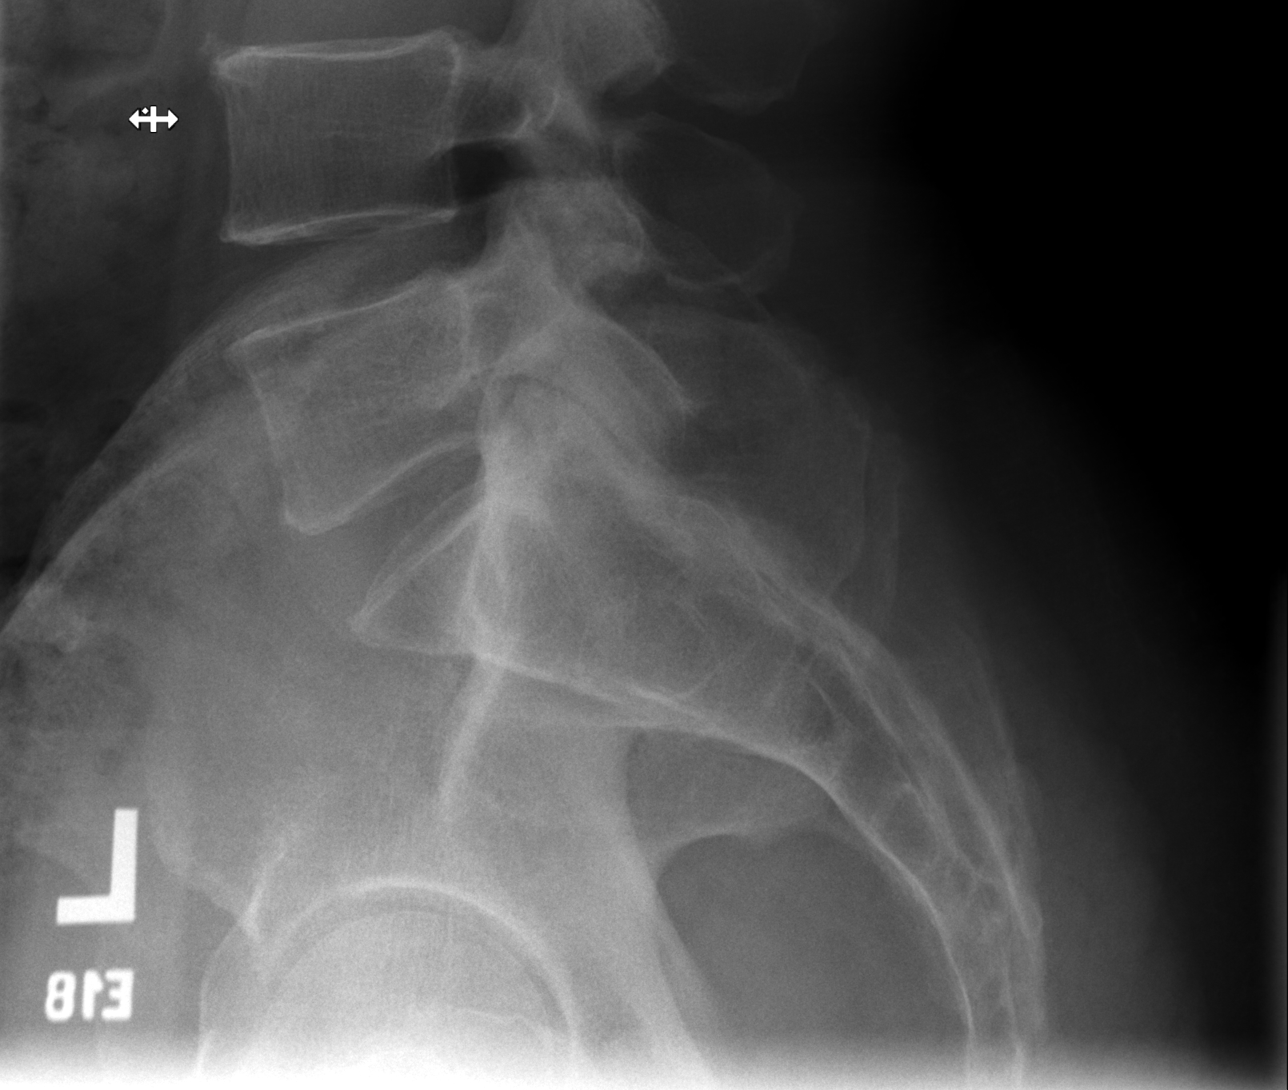

[5 of 5 positions shown; findings below may reference images not displayed]

FINDINGS: Five lumbar type vertebral bodies are well visualized. Vertebral
body height is well maintained. No pars defects are noted. No
anterolisthesis is seen. Mild osteophytic changes are noted. No soft
tissue changes are seen.
IMPRESSION: Mild degenerative change without acute abnormality.

## 2023-10-08 ENCOUNTER — Other Ambulatory Visit: Payer: Self-pay | Admitting: Internal Medicine

## 2023-10-08 ENCOUNTER — Encounter: Payer: Self-pay | Admitting: Internal Medicine

## 2023-10-08 DIAGNOSIS — I10 Essential (primary) hypertension: Secondary | ICD-10-CM

## 2023-10-09 ENCOUNTER — Encounter: Payer: Self-pay | Admitting: Internal Medicine

## 2023-10-09 ENCOUNTER — Ambulatory Visit (INDEPENDENT_AMBULATORY_CARE_PROVIDER_SITE_OTHER): Payer: HMO | Admitting: Internal Medicine

## 2023-10-09 VITALS — BP 124/73 | HR 97 | Ht 71.0 in | Wt 196.8 lb

## 2023-10-09 DIAGNOSIS — B353 Tinea pedis: Secondary | ICD-10-CM | POA: Diagnosis not present

## 2023-10-09 DIAGNOSIS — J302 Other seasonal allergic rhinitis: Secondary | ICD-10-CM | POA: Diagnosis not present

## 2023-10-09 DIAGNOSIS — J0110 Acute frontal sinusitis, unspecified: Secondary | ICD-10-CM | POA: Diagnosis not present

## 2023-10-09 MED ORDER — TERBINAFINE HCL 250 MG PO TABS: 250.0000 mg | ORAL_TABLET | Freq: Every day | ORAL | 0 refills | Status: DC

## 2023-10-09 MED ORDER — AMOXICILLIN-POT CLAVULANATE 875-125 MG PO TABS: 1.0000 | ORAL_TABLET | Freq: Two times a day (BID) | ORAL | 0 refills | Status: DC

## 2023-10-09 MED ORDER — FLUTICASONE PROPIONATE 50 MCG/ACT NA SUSP: 2.0000 | Freq: Every day | NASAL | 6 refills | Status: AC

## 2023-10-09 NOTE — Progress Notes (Signed)
 Acute Office Visit  Subjective:    Patient ID: Bruce Adkins, male    DOB: 1955-12-06, 68 y.o.   MRN: [id number]  Chief Complaint  Patient presents with   Sinus Problem    Pt reports sinus infection, reports sx of nasal congestion, has had some bleeding. Ongoing for 2 weeks, has used otc medication has not had relief.     HPI Patient is in today for complaint of nasal congestion, postnasal drip and sinus pressure related headache for the last 2 weeks.  He also has dry cough, but denies any dyspnea or wheezing currently.  Denies fever or chills.  He has used OTC decongestants without much relief.  He also reports persistent whitish rash and cracking in the interdigital space of the left foot.  He has completed oral terbinafine  for 2 weeks, which has improved the rash compared to prior.  He has overlapping toes currently.  Past Medical History:  Diagnosis Date   Arthritis    BPH with obstruction/lower urinary tract symptoms    CKD (chronic kidney disease)    DVT (deep venous thrombosis) (HCC) 2016   LLE   GERD (gastroesophageal reflux disease)    Hyperlipidemia    Hypertension    Panic attacks     Past Surgical History:  Procedure Laterality Date   BALLOON DILATION  12/30/2020   Procedure: BALLOON DILATION;  Surgeon: Eartha Angelia Sieving, MD;  [facility];  Service: Gastroenterology;;   BIOPSY  12/30/2020   Procedure: BIOPSY;  Surgeon: Eartha Angelia Sieving, MD;  [facility]: [facility];  Service: Gastroenterology;;   COLONOSCOPY     COLONOSCOPY WITH PROPOFOL  N/A 12/30/2020   Procedure: COLONOSCOPY WITH PROPOFOL ;  Surgeon: Eartha Angelia Sieving, MD;  Location: [facility];  Service: Gastroenterology;  Laterality: N/A;   ESOPHAGOGASTRODUODENOSCOPY (EGD) WITH PROPOFOL  N/A 12/30/2020   Procedure: ESOPHAGOGASTRODUODENOSCOPY (EGD) WITH PROPOFOL ;  Surgeon: Eartha Angelia Sieving, MD;  Location: [facility];  Service: Gastroenterology;  Laterality: N/A;  am    TRANSURETHRAL RESECTION OF PROSTATE N/A 03/10/2022   Procedure: TRANSURETHRAL RESECTION OF THE PROSTATE (TURP);  Surgeon: Matilda Senior, MD;  Location: [facility];  Service: Urology;  Laterality: N/A;    Family History  Problem Relation Age of Onset   Heart failure Mother    Hypertension Mother    Anxiety disorder Mother    Hypertension Father    Cancer Brother     Social History   Socioeconomic History   Marital status: Married    Spouse name: Atticus Wedin   Number of children: 3   Years of education: Not on file   Highest education level: GED or equivalent  Occupational History   Not on file  Tobacco Use   Smoking status: Never   Smokeless tobacco: Never  Vaping Use   Vaping status: Never Used  Substance and Sexual Activity   Alcohol use: No   Drug use: Not Currently   Sexual activity: Yes  Other Topics Concern   Not on file  Social History Narrative   Not on file   Social Drivers of Health   Financial Resource Strain: Low Risk  (12/20/2022)   Overall Financial Resource Strain (CARDIA)    Difficulty of Paying Living Expenses: Not hard at all  Food Insecurity: No Food Insecurity (12/20/2022)   Hunger Vital Sign    Worried About Running Out of Food in the Last Year: Never true    Ran Out of Food in the Last Year:  Never true  Transportation Needs: No Transportation Needs (12/20/2022)   PRAPARE - Administrator, Civil Service (Medical): No    Lack of Transportation (Non-Medical): No  Physical Activity: Unknown (12/20/2022)   Exercise Vital Sign    Days of Exercise per Week: 0 days    Minutes of Exercise per Session: Not on file  Stress: Stress Concern Present (12/20/2022)   [employer] of Occupational Health - Occupational Stress Questionnaire    Feeling of Stress : To some extent  Social Connections: Unknown (12/20/2022)   Social Connection and Isolation Panel [NHANES]    Frequency of Communication with Friends and Family:  Patient declined    Frequency of Social Gatherings with Friends and Family: Patient declined    Attends Religious Services: More than 4 times per year    Active Member of [employer] or Organizations: Patient declined    Attends Banker Meetings: Not on file    Marital Status: Married  Intimate Partner Violence: Not At Risk (09/08/2020)   Humiliation, Afraid, Rape, and Kick questionnaire    Fear of Current or Ex-Partner: No    Emotionally Abused: No    Physically Abused: No    Sexually Abused: No    Outpatient Medications Prior to Visit  Medication Sig Dispense Refill   acetaminophen  (TYLENOL ) 500 MG tablet Take 1,000 mg by mouth every 6 (six) hours as needed for mild pain.     ALPRAZolam  (XANAX ) 0.25 MG tablet TAKE 1 TABLET BY MOUTH AT BEDTIME 30 tablet 0   atorvastatin  (LIPITOR) 40 MG tablet Take 1 tablet (40 mg total) by mouth daily. 90 tablet 3   clotrimazole  (LOTRIMIN ) 1 % cream Apply 1 Application topically 2 (two) times daily. 30 g 0   ELIQUIS  5 MG TABS tablet Take 1 tablet by mouth twice daily 180 tablet 0   losartan  (COZAAR ) 25 MG tablet Take 1 tablet by mouth once daily 90 tablet 0   Semaglutide , 1 MG/DOSE, 4 MG/3ML SOPN Inject 1 mg as directed once a week. 3 mL 3   tadalafil  (CIALIS ) 20 MG tablet 1/2 to 1 tab po prn 30 tablet 11   fluticasone  (FLONASE ) 50 MCG/ACT nasal spray Place 2 sprays into both nostrils daily. 16 g 6   No facility-administered medications prior to visit.    No Known Allergies  Review of Systems  Constitutional:  Negative for chills and fever.  HENT:  Positive for congestion, postnasal drip and sinus pressure.   Eyes:  Negative for pain and discharge.  Respiratory:  Positive for cough. Negative for shortness of breath.   Cardiovascular:  Negative for chest pain and palpitations.  Gastrointestinal:  Negative for constipation, diarrhea and vomiting.  Endocrine: Negative for polydipsia and polyuria.  Genitourinary:  Positive for difficulty  urinating. Negative for dysuria and hematuria.  Musculoskeletal:  Positive for arthralgias (L shoulder) and back pain. Negative for neck pain and neck stiffness.  Neurological:  Negative for dizziness, weakness, numbness and headaches.  Psychiatric/Behavioral:  Negative for agitation and behavioral problems.        Objective:    Physical Exam Vitals reviewed.  Constitutional:      General: He is not in acute distress.    Appearance: He is not diaphoretic.  HENT:     Head: Normocephalic and atraumatic.     Nose: Congestion present.     Right Sinus: Frontal sinus tenderness present.     Left Sinus: Frontal sinus tenderness present.     Mouth/Throat:  Mouth: Mucous membranes are moist.  Eyes:     General: No scleral icterus.    Extraocular Movements: Extraocular movements intact.  Cardiovascular:     Rate and Rhythm: Normal rate and regular rhythm.     Pulses: Normal pulses.     Heart sounds: Normal heart sounds. No murmur heard. Pulmonary:     Breath sounds: Normal breath sounds. No wheezing or rales.  Musculoskeletal:     Cervical back: Neck supple. No tenderness.     Right lower leg: No edema.     Left lower leg: No edema.  Skin:    General: Skin is warm.     Findings: Rash (Whitish patches with cracking in the interdigital spaces of left foot) present.  Neurological:     General: No focal deficit present.     Mental Status: He is alert and oriented to person, place, and time.  Psychiatric:        Mood and Affect: Mood normal.        Behavior: Behavior normal.     BP 124/73   Pulse 97   Ht 5' 11 (1.803 m)   Wt 196 lb 12.8 oz (89.3 kg)   SpO2 96%   BMI 27.45 kg/m  Wt Readings from Last 3 Encounters:  10/09/23 196 lb 12.8 oz (89.3 kg)  09/18/23 204 lb 12.8 oz (92.9 kg)  08/17/23 207 lb 6.4 oz (94.1 kg)        Assessment & Plan:   Problem List Items Addressed This Visit       Respiratory   Allergic rhinitis   Continue Flonase  Allegra PRN       Relevant Medications   fluticasone  (FLONASE ) 50 MCG/ACT nasal spray   Acute non-recurrent frontal sinusitis - Primary   Started empiric Augmentin  as he has persistent symptoms despite symptomatic treatment Continue Flonase  for nasal congestion Use humidifier and/or vaporizer for nasal congestion      Relevant Medications   fluticasone  (FLONASE ) 50 MCG/ACT nasal spray   amoxicillin -clavulanate (AUGMENTIN ) 875-125 MG tablet   terbinafine  (LAMISIL ) 250 MG tablet     Musculoskeletal and Integument   Tinea pedis of left foot   Rash has improved with terbinafine , but residuals left with cracking in the interdigital space Continue terbinafine  250 mg oral for 2 more weeks Continue clotrimazole  cream Advised to use toe wrap to avoid overlapping toes causing friction to one another      Relevant Medications   terbinafine  (LAMISIL ) 250 MG tablet     Meds ordered this encounter  Medications   fluticasone  (FLONASE ) 50 MCG/ACT nasal spray    Sig: Place 2 sprays into both nostrils daily.    Dispense:  16 g    Refill:  6   amoxicillin -clavulanate (AUGMENTIN ) 875-125 MG tablet    Sig: Take 1 tablet by mouth 2 (two) times daily.    Dispense:  14 tablet    Refill:  0   terbinafine  (LAMISIL ) 250 MG tablet    Sig: Take 1 tablet (250 mg total) by mouth daily.    Dispense:  14 tablet    Refill:  0     Yanice Maqueda MARLA Blanch, MD

## 2023-10-09 NOTE — Patient Instructions (Signed)
 Please start taking Terbinafine for foot infection.  Please start taking Augmentin as prescribed for sinusitis.  Please use Flonase for nasal congestion/allergies.

## 2023-10-12 DIAGNOSIS — J0110 Acute frontal sinusitis, unspecified: Secondary | ICD-10-CM | POA: Insufficient documentation

## 2023-10-12 NOTE — Assessment & Plan Note (Signed)
Continue Flonase Allegra PRN

## 2023-10-12 NOTE — Assessment & Plan Note (Addendum)
Rash has improved with terbinafine, but residuals left with cracking in the interdigital space Continue terbinafine 250 mg oral for 2 more weeks Continue clotrimazole cream Advised to use toe wrap to avoid overlapping toes causing friction to one another

## 2023-10-12 NOTE — Assessment & Plan Note (Addendum)
Started empiric Augmentin as he has persistent symptoms despite symptomatic treatment Continue Flonase for nasal congestion Use humidifier and/or vaporizer for nasal congestion

## 2023-10-30 ENCOUNTER — Other Ambulatory Visit: Payer: Self-pay | Admitting: Internal Medicine

## 2023-10-30 DIAGNOSIS — I10 Essential (primary) hypertension: Secondary | ICD-10-CM

## 2023-10-31 MED ORDER — LOSARTAN POTASSIUM 25 MG PO TABS: 25.0000 mg | ORAL_TABLET | Freq: Every day | ORAL | 0 refills | Status: DC

## 2023-11-07 DIAGNOSIS — I129 Hypertensive chronic kidney disease with stage 1 through stage 4 chronic kidney disease, or unspecified chronic kidney disease: Secondary | ICD-10-CM | POA: Diagnosis not present

## 2023-11-07 DIAGNOSIS — E1129 Type 2 diabetes mellitus with other diabetic kidney complication: Secondary | ICD-10-CM | POA: Diagnosis not present

## 2023-11-07 DIAGNOSIS — E1122 Type 2 diabetes mellitus with diabetic chronic kidney disease: Secondary | ICD-10-CM | POA: Diagnosis not present

## 2023-11-07 LAB — BASIC METABOLIC PANEL: BUN: 8 (ref 4–21)

## 2023-11-07 LAB — COMPREHENSIVE METABOLIC PANEL: eGFR: 46

## 2023-11-08 LAB — PROTEIN / CREATININE RATIO, URINE: Creatinine, Urine: 157.7

## 2023-11-09 ENCOUNTER — Ambulatory Visit: Payer: Self-pay | Admitting: Internal Medicine

## 2023-11-14 ENCOUNTER — Ambulatory Visit (INDEPENDENT_AMBULATORY_CARE_PROVIDER_SITE_OTHER): Payer: Medicare HMO

## 2023-11-14 VITALS — BP 124/73 | Ht 71.0 in | Wt 196.0 lb

## 2023-11-14 DIAGNOSIS — Z0000 Encounter for general adult medical examination without abnormal findings: Secondary | ICD-10-CM | POA: Diagnosis not present

## 2023-11-14 NOTE — Patient Instructions (Signed)
Bruce Adkins , Thank you for taking time to come for your Medicare Wellness Visit. I appreciate your ongoing commitment to your health goals. Please review the following plan we discussed and let me know if I can assist you in the future.   Referrals/Orders/Follow-Ups/Clinician Recommendations:  Next Medicare Annual Wellness Visit:   November 18, 2024 at 9:20 am virtual visit  You are due for the vaccines checked below. You may have these done at your preferred pharmacy. Please have them fax the office proof of the vaccines so that we can update your chart.   []  Flu (due annually)  Recommended this fall either at PCP office or through your local pharmacy. The flu season starts August 1 of each year.   []  Shingrix (Shingles vaccine): CDC recommends 2 doses of Shingrix separated by 2-6 months for aged 7 years and older:  []  Pneumonia Vaccines: Recommended for adults 65 years or older  [x]  TDAP (Tetanus) Vaccine every 10 years:Recommended every 10 years; Please call your insurance company to determine your out of pocket expense. You also receive this vaccine at your local pharmacy or Health Dept.  [x]  Covid-19: Available now at any [facility] pharmacy (see info below)  You may also get your vaccines at any [facility] (locations listed below.) Vaccine hours are Monday - Friday 9:00 - 4:00. No appointments are required. Most insurances are accepted including Medicaid. Anyone can use the community pharmacies, and people are not required to have a [facility] provider.  Community Pharmacy Locations offering vaccines:   Sport and exercise psychologist   [facility]  10 vaccines are offered at the [employer]: Covid, flu, Tdap, shingles, RSV, pneumonia, meningococcal, hepatitis A, hepatitis B, and HPV.    If lab work has been ordered for you today, you may have these drawn at the same  lab you have your routine lab work drawn for your primary care provider.  Labs Ordered: na  This is a list of the screening recommended for you and due dates:  Health Maintenance  Topic Date Due   Eye exam for diabetics  Never done   DTaP/Tdap/Td vaccine (1 - Tdap) Never done   COVID-19 Vaccine (5 - 2024-25 season) 05/28/2023   Yearly kidney health urinalysis for diabetes  12/21/2023   Hemoglobin A1C  02/12/2024   Complete foot exam   05/16/2024   Yearly kidney function blood test for diabetes  08/14/2024   Medicare Annual Wellness Visit  11/13/2024   Colon Cancer Screening  12/31/2030   Pneumonia Vaccine  Completed   Flu Shot  Completed   Hepatitis C Screening  Completed   Zoster (Shingles) Vaccine  Completed   HPV Vaccine  Aged Out    Advanced directives: (Provided) Advance directive discussed with you today. I have provided a copy for you to complete at home and have notarized. Once this is complete, please bring a copy in to our office so we can scan it into your chart.   Next Medicare Annual Wellness Visit scheduled for next year: yes   Understanding Your Risk for Falls Millions of people have serious injuries from falls each year. It is important to understand your risk of falling. Talk with your health care provider about your risk and what you can do to lower it. If you do have a serious fall, make sure to tell your provider. Falling once raises your risk  of falling again. How can falls affect me? Serious injuries from falls are common. These include: Broken bones, such as hip fractures. Head injuries, such as traumatic brain injuries (TBI) or concussions. A fear of falling can cause you to avoid activities and stay at home. This can make your muscles weaker and raise your risk for a fall. What can increase my risk? There are a number of risk factors that increase your risk for falling. The more risk factors you have, the higher your risk of falling. Serious injuries from  a fall happen most often to people who are older than 67 years old. Teenagers and young adults ages 66-29 are also at higher risk. Common risk factors include: Weakness in the lower body. Being generally weak or confused due to long-term (chronic) illness. Dizziness or balance problems. Poor vision. Medicines that cause dizziness or drowsiness. These may include: Medicines for your blood pressure, heart, anxiety, insomnia, or swelling (edema). Pain medicines. Muscle relaxants. Other risk factors include: Drinking alcohol. Having had a fall in the past. Having foot pain or wearing improper footwear. Working at a dangerous job. Having any of the following in your home: Tripping hazards, such as floor clutter or loose rugs. Poor lighting. Pets. Having dementia or memory loss. What actions can I take to lower my risk of falling?     Physical activity Stay physically fit. Do strength and balance exercises. Consider taking a regular class to build strength and balance. Yoga and tai chi are good options. Vision Have your eyes checked every year and your prescription for glasses or contacts updated as needed. Shoes and walking aids Wear non-skid shoes. Wear shoes that have rubber soles and low heels. Do not wear high heels. Do not walk around the house in socks or slippers. Use a cane or walker as told by your provider. Home safety Attach secure railings on both sides of your stairs. Install grab bars for your bathtub, shower, and toilet. Use a non-skid mat in your bathtub or shower. Attach bath mats securely with double-sided, non-slip rug tape. Use good lighting in all rooms. Keep a flashlight near your bed. Make sure there is a clear path from your bed to the bathroom. Use night-lights. Do not use throw rugs. Make sure all carpeting is taped or tacked down securely. Remove all clutter from walkways and stairways, including extension cords. Repair uneven or broken steps and  floors. Avoid walking on icy or slippery surfaces. Walk on the grass instead of on icy or slick sidewalks. Use ice melter to get rid of ice on walkways in the winter. Use a cordless phone. Questions to ask your health care provider Can you help me check my risk for a fall? Do any of my medicines make me more likely to fall? Should I take a vitamin D supplement? What exercises can I do to improve my strength and balance? Should I make an appointment to have my vision checked? Do I need a bone density test to check for weak bones (osteoporosis)? Would it help to use a cane or a walker? Where to find more information Centers for Disease Control and Prevention, [employer]: TonerPromos.no Community-Based Fall Prevention Programs: TonerPromos.no [employer] on Aging: BaseRingTones.pl Contact a health care provider if: You fall at home. You are afraid of falling at home. You feel weak, drowsy, or dizzy. This information is not intended to replace advice given to you by your health care provider. Make sure you discuss any questions you have with  your health care provider. Document Revised: 05/16/2022 Document Reviewed: 05/16/2022 Elsevier Patient Education  2024 [employer].

## 2023-11-14 NOTE — Progress Notes (Signed)
Because this visit was a virtual/telehealth visit,  certain criteria was not obtained, such a blood pressure, CBG if applicable, and timed get up and go. Any medications not marked as "taking" were not mentioned during the medication reconciliation part of the visit. Any vitals not documented were not able to be obtained due to this being a telehealth visit or patient was unable to self-report a recent blood pressure reading due to a lack of equipment at home via telehealth. Vitals that have been documented are verbally provided by the patient.  Interactive audio and video telecommunications were attempted between this provider and patient, however failed, due to patient having technical difficulties OR patient did not have access to video capability.  We continued and completed visit with audio only.  Subjective:   Bruce Adkins is a 68 y.o. male who presents for Medicare Annual/Subsequent preventive examination.  Visit Complete: Virtual I connected with  [name] on 11/14/23 by a audio enabled telemedicine application and verified that I am speaking with the correct person using two identifiers.  Patient Location: Home  Provider Location: Home Office  I discussed the limitations of evaluation and management by telemedicine. The patient expressed understanding and agreed to proceed.  Vital Signs: Because this visit was a virtual/telehealth visit, some criteria may be missing or patient reported. Any vitals not documented were not able to be obtained and vitals that have been documented are patient reported.   Cardiac Risk Factors include: advanced age (>54men, >28 women);dyslipidemia;hypertension;male gender     Objective:    Today's Vitals   11/14/23 0926 11/14/23 0928  BP: 124/73   Weight: 196 lb (88.9 kg)   Height: 5\' 11"  (1.803 m)   PainSc:  0-No pain   Body mass index is 27.34 kg/m.     11/14/2023    9:31 AM 03/10/2022    9:50 AM 11/03/2021    8:43 AM 06/13/2021     7:16 PM 04/15/2021    9:24 AM 01/26/2021    3:19 PM 12/30/2020    7:52 AM  Advanced Directives  Does Patient Have a Medical Advance Directive? No No No No No No No  Does patient want to make changes to medical advance directive?    No - Patient declined     Would patient like information on creating a medical advance directive? Yes (MAU/Ambulatory/Procedural Areas - Information given) No - Patient declined No - Patient declined  No - Patient declined No - Patient declined No - Patient declined    Current Medications (verified) Outpatient Encounter Medications as of 11/14/2023  Medication Sig   acetaminophen (TYLENOL) 500 MG tablet Take 1,000 mg by mouth every 6 (six) hours as needed for mild pain.   ALPRAZolam (XANAX) 0.25 MG tablet TAKE 1 TABLET BY MOUTH AT BEDTIME   atorvastatin (LIPITOR) 40 MG tablet Take 1 tablet (40 mg total) by mouth daily.   clotrimazole (LOTRIMIN) 1 % cream Apply 1 Application topically 2 (two) times daily.   ELIQUIS 5 MG TABS tablet Take 1 tablet by mouth twice daily   fluticasone (FLONASE) 50 MCG/ACT nasal spray Place 2 sprays into both nostrils daily.   losartan (COZAAR) 25 MG tablet Take 1 tablet (25 mg total) by mouth daily.   Semaglutide, 1 MG/DOSE, 4 MG/3ML SOPN Inject 1 mg as directed once a week.   tadalafil (CIALIS) 20 MG tablet 1/2 to 1 tab po prn   terbinafine (LAMISIL) 250 MG tablet Take 1 tablet (250 mg total) by mouth  daily.   amoxicillin-clavulanate (AUGMENTIN) 875-125 MG tablet Take 1 tablet by mouth 2 (two) times daily. (Patient not taking: Reported on 11/14/2023)   No facility-administered encounter medications on file as of 11/14/2023.    Allergies (verified) Patient has no known allergies.   History: Past Medical History:  Diagnosis Date   Arthritis    BPH with obstruction/lower urinary tract symptoms    CKD (chronic kidney disease)    DVT (deep venous thrombosis) (HCC) 2016   LLE   GERD (gastroesophageal reflux disease)    Hyperlipidemia     Hypertension    Panic attacks    Past Surgical History:  Procedure Laterality Date   BALLOON DILATION  12/30/2020   Procedure: BALLOON DILATION;  Surgeon: Dolores Frame, MD;  [facility];  Service: Gastroenterology;;   BIOPSY  12/30/2020   Procedure: BIOPSY;  Surgeon: Dolores Frame, MD;  [facility];  Service: Gastroenterology;;   COLONOSCOPY     COLONOSCOPY WITH PROPOFOL N/A 12/30/2020   Procedure: COLONOSCOPY WITH PROPOFOL;  Surgeon: Dolores Frame, MD;  [facility]: [facility];  Service: Gastroenterology;  Laterality: N/A;   ESOPHAGOGASTRODUODENOSCOPY (EGD) WITH PROPOFOL N/A 12/30/2020   Procedure: ESOPHAGOGASTRODUODENOSCOPY (EGD) WITH PROPOFOL;  Surgeon: Dolores Frame, MD;  Location: [facility];  Service: Gastroenterology;  Laterality: N/A;  am   TRANSURETHRAL RESECTION OF PROSTATE N/A 03/10/2022   Procedure: TRANSURETHRAL RESECTION OF THE PROSTATE (TURP);  Surgeon: Marcine Matar, MD;  Location[facility];  Service: Urology;  Laterality: N/A;   Family History  Problem Relation Age of Onset   Heart failure Mother    Hypertension Mother    Anxiety disorder Mother    Hypertension Father    Cancer Brother    Social History   Socioeconomic History   Marital status: Married    Spouse name: Kailo Kosik   Number of children: 3   Years of education: Not on file   Highest education level: GED or equivalent  Occupational History   Not on file  Tobacco Use   Smoking status: Never   Smokeless tobacco: Never  Vaping Use   Vaping status: Never Used  Substance and Sexual Activity   Alcohol use: No   Drug use: Not Currently   Sexual activity: Yes  Other Topics Concern   Not on file  Social History Narrative   Not on file   Social Drivers of Health   Financial Resource Strain: Low Risk  (11/14/2023)   Overall Financial Resource Strain (CARDIA)    Difficulty of Paying Living Expenses: Not  hard at all  Food Insecurity: No Food Insecurity (11/14/2023)   Hunger Vital Sign    Worried About Running Out of Food in the Last Year: Never true    Ran Out of Food in the Last Year: Never true  Transportation Needs: No Transportation Needs (11/14/2023)   PRAPARE - Administrator, Civil Service (Medical): No    Lack of Transportation (Non-Medical): No  Physical Activity: Insufficiently Active (11/14/2023)   Exercise Vital Sign    Days of Exercise per Week: 7 days    Minutes of Exercise per Session: 20 min  Stress: Stress Concern Present (11/14/2023)   [employer] of Occupational Health - Occupational Stress Questionnaire    Feeling of Stress : To some extent  Social Connections: Unknown (11/14/2023)   Social Connection and Isolation Panel [NHANES]    Frequency of Communication with Friends and Family: Patient declined  Frequency of Social Gatherings with Friends and Family: Patient declined    Attends Religious Services: More than 4 times per year    Active Member of [employer] or Organizations: Yes    Attends Engineer, structural: More than 4 times per year    Marital Status: Married    Tobacco Counseling Counseling given: Yes   Clinical Intake:  Pre-visit preparation completed: Yes  Pain : No/denies pain Pain Score: 0-No pain     BMI - recorded: 27.34 Nutritional Status: BMI 25 -29 Overweight Nutritional Risks: None Diabetes: No  How often do you need to have someone help you when you read instructions, pamphlets, or other written materials from your doctor or pharmacy?: 1 - Never  Interpreter Needed?: No  Information entered by :: Maryjean Ka CMA   Activities of Daily Living    11/14/2023    9:29 AM  In your present state of health, do you have any difficulty performing the following activities:  Hearing? 0  Vision? 0  Difficulty concentrating or making decisions? 0  Walking or climbing stairs? 0  Dressing or bathing? 0  Doing errands,  shopping? 0  Preparing Food and eating ? N  Using the Toilet? N  In the past six months, have you accidently leaked urine? N  Do you have problems with loss of bowel control? N  Managing your Medications? N  Managing your Finances? N  Housekeeping or managing your Housekeeping? N    Patient Care Team: Anabel Halon, MD as PCP - General (Internal Medicine) Rollene Rotunda, MD as PCP - Cardiology (Cardiology) Doreatha Massed, MD as Consulting Physician (Hematology) Vickki Hearing, MD as Consulting Physician (Orthopedic Surgery) Marcine Matar, MD as Consulting Physician (Urology) Randa Lynn, MD as Referring Physician (Nephrology) Theda Belfast, OD ([facility] [employer]  Indicate any recent Medical Services you may have received from other than [facility] providers in the past year (date may be approximate).     Assessment:   This is a routine wellness examination for [place].  Hearing/Vision screen Hearing Screening - Comments:: Patient denies any hearing difficulties.   Vision Screening - Comments:: Wears rx glasses - up to date with routine eye exams  [facility]es [facility]   Goals Addressed             This Visit's Progress    Patient Stated       Improve my health       Depression Screen    11/14/2023    9:32 AM 10/09/2023    2:07 PM 09/18/2023    2:58 PM 08/17/2023   10:03 AM 05/17/2023   10:02 AM 12/21/2022   10:26 AM 08/24/2022    9:47 AM  PHQ 2/9 Scores  PHQ - 2 Score 0 0 0 0 0 0 0  PHQ- 9 Score 0 0   5      Fall Risk    11/14/2023    9:31 AM 10/09/2023    2:07 PM 09/18/2023    2:58 PM 08/17/2023   10:02 AM 05/17/2023   10:02 AM  Fall Risk   Falls in the past year? 0 0 0 0 0  Number falls in past yr: 0 0 0 0   Injury with Fall? 0 0 0 0   Risk for fall due to : No Fall Risks No Fall Risks No Fall Risks No Fall Risks   Follow up Falls prevention discussed;Falls evaluation completed Falls  evaluation  completed Falls evaluation completed Falls evaluation completed     MEDICARE RISK AT HOME: Medicare Risk at Home Any stairs in or around the home?: Yes If so, are there any without handrails?: No Home free of loose throw rugs in walkways, pet beds, electrical cords, etc?: Yes Adequate lighting in your home to reduce risk of falls?: Yes Life alert?: No Use of a cane, walker or w/c?: No Grab bars in the bathroom?: No Shower chair or bench in shower?: No Elevated toilet seat or a handicapped toilet?: Yes  TIMED UP AND GO:  Was the test performed?  No    Cognitive Function:        11/14/2023    9:32 AM  6CIT Screen  What Year? 0 points  What month? 0 points  What time? 0 points  Count back from 20 0 points  Months in reverse 0 points  Repeat phrase 0 points  Total Score 0 points    Immunizations Immunization History  Administered Date(s) Administered   Fluad Quad(high Dose 65+) 06/22/2022, 07/03/2023   Influenza, Quadrivalent, Recombinant, Inj, Pf 06/20/2019   Influenza, Seasonal, Injecte, Preservative Fre 07/03/2017   Influenza,inj,Quad PF,6+ Mos 07/16/2018   Influenza-Unspecified 07/10/2020, 07/01/2021   Moderna Covid-19 Vaccine Bivalent Booster 65yrs & up 07/22/2022   Moderna Sars-Covid-2 Vaccination 12/07/2019, 01/08/2020, 08/08/2020   PNEUMOCOCCAL CONJUGATE-20 06/22/2022   Zoster Recombinant(Shingrix) 01/31/2022, 06/03/2022    TDAP status: Due, Education has been provided regarding the importance of this vaccine. Advised may receive this vaccine at local pharmacy or Health Dept. Aware to provide a copy of the vaccination record if obtained from local pharmacy or Health Dept. Verbalized acceptance and understanding.  Flu Vaccine status: Up to date  Pneumococcal vaccine status: Up to date  Covid-19 vaccine status: Information provided on how to obtain vaccines.   Qualifies for Shingles Vaccine? No   Zostavax completed No   Shingrix Completed?:  Yes  Screening Tests Health Maintenance  Topic Date Due   Medicare Annual Wellness (AWV)  Never done   OPHTHALMOLOGY EXAM  Never done   DTaP/Tdap/Td (1 - Tdap) Never done   COVID-19 Vaccine (5 - 2024-25 season) 05/28/2023   Diabetic kidney evaluation - Urine ACR  12/21/2023   HEMOGLOBIN A1C  02/12/2024   FOOT EXAM  05/16/2024   Diabetic kidney evaluation - eGFR measurement  08/14/2024   Colonoscopy  12/31/2030   Pneumonia Vaccine 69+ Years old  Completed   INFLUENZA VACCINE  Completed   Hepatitis C Screening  Completed   Zoster Vaccines- Shingrix  Completed   HPV VACCINES  Aged Out    Health Maintenance  Health Maintenance Due  Topic Date Due   Medicare Annual Wellness (AWV)  Never done   OPHTHALMOLOGY EXAM  Never done   DTaP/Tdap/Td (1 - Tdap) Never done   COVID-19 Vaccine (5 - 2024-25 season) 05/28/2023    Colorectal cancer screening: Type of screening: Colonoscopy. Completed 12/30/2020. Repeat every 10 years  Lung Cancer Screening: (Low Dose CT Chest recommended if Age 37-80 years, 20 pack-year currently smoking OR have quit w/in 15years.) does not qualify.   Lung Cancer Screening Referral: na  Additional Screening:  Hepatitis C Screening: does not qualify; Completed   Vision Screening: Recommended annual ophthalmology exams for early detection of glaucoma and other disorders of the eye. Is the patient up to date with their annual eye exam?  Yes  Who is the provider or what is the name of the office in which the patient attends annual  eye exams? Dr. Carollee Massed, will request ov note If pt is not established with a provider, would they like to be referred to a provider to establish care? No .   Dental Screening: Recommended annual dental exams for proper oral hygiene  Diabetic Foot Exam: Diabetic Foot Exam: Completed 05/17/2023  Community Resource Referral / Chronic Care Management: CRR required this visit?  No   CCM required this visit?  No     Plan:     I  have personally reviewed and noted the following in the patient's chart:   Medical and social history Use of alcohol, tobacco or illicit drugs  Current medications and supplements including opioid prescriptions. Patient is not currently taking opioid prescriptions. Functional ability and status Nutritional status Physical activity Advanced directives List of other physicians Hospitalizations, surgeries, and ER visits in previous 12 months Vitals Screenings to include cognitive, depression, and falls Referrals and appointments  In addition, I have reviewed and discussed with patient certain preventive protocols, quality metrics, and best practice recommendations. A written personalized care plan for preventive services as well as general preventive health recommendations were provided to patient.     Jordan Hawks Kadon Andrus, CMA   11/14/2023   After Visit Summary: (MyChart) Due to this being a telephonic visit, the after visit summary with patients personalized plan was offered to patient via MyChart   Nurse Notes: see routing comment

## 2023-11-16 DIAGNOSIS — E1122 Type 2 diabetes mellitus with diabetic chronic kidney disease: Secondary | ICD-10-CM | POA: Diagnosis not present

## 2023-11-16 DIAGNOSIS — N2581 Secondary hyperparathyroidism of renal origin: Secondary | ICD-10-CM | POA: Diagnosis not present

## 2023-11-16 DIAGNOSIS — I129 Hypertensive chronic kidney disease with stage 1 through stage 4 chronic kidney disease, or unspecified chronic kidney disease: Secondary | ICD-10-CM | POA: Diagnosis not present

## 2023-11-16 DIAGNOSIS — R809 Proteinuria, unspecified: Secondary | ICD-10-CM | POA: Diagnosis not present

## 2023-11-20 ENCOUNTER — Other Ambulatory Visit: Payer: Self-pay | Admitting: Internal Medicine

## 2023-11-20 DIAGNOSIS — I82409 Acute embolism and thrombosis of unspecified deep veins of unspecified lower extremity: Secondary | ICD-10-CM

## 2023-12-04 NOTE — Telephone Encounter (Signed)
 Copied from CRM [id number]. Topic: Clinical - Prescription Issue >> Dec 04, 2023 12:51 PM Carlatta H wrote: Reason for CRM: Please callback [phone] Option 2 about ozempic prescription

## 2023-12-05 ENCOUNTER — Telehealth: Payer: Self-pay | Admitting: Pharmacy Technician

## 2023-12-05 ENCOUNTER — Other Ambulatory Visit (HOSPITAL_COMMUNITY): Payer: Self-pay

## 2023-12-05 NOTE — Telephone Encounter (Signed)
 Pharmacy Patient Advocate Encounter   Received notification from  PATIENT INSURANCE  that prior authorization for [facility] 1MG /DOSE is required/requested.   Insurance verification completed.   The patient is insured through [facility] ADVANTAGE/RX ADVANCE .   Per test claim: PA required; PA submitted to above mentioned insurance via Fax Key/confirmation #/[id number]   Status is pending

## 2023-12-05 NOTE — Telephone Encounter (Signed)
 Pharmacy Patient Advocate Encounter  Received notification from [facility] ADVANTAGE/RX ADVANCE that Prior Authorization for [facility] 1MG /DOSE has been DENIED.  Full denial letter will be uploaded to the media tab. See denial reason below.     He has to have an A1C of 6.5 or higher. None of his lab values were at 6.5 or higher.

## 2023-12-12 ENCOUNTER — Encounter: Payer: Self-pay | Admitting: Internal Medicine

## 2023-12-15 ENCOUNTER — Encounter: Payer: Self-pay | Admitting: Internal Medicine

## 2023-12-15 ENCOUNTER — Ambulatory Visit: Payer: Medicare HMO | Admitting: Internal Medicine

## 2023-12-15 VITALS — BP 116/73 | HR 82 | Ht 71.0 in | Wt 190.2 lb

## 2023-12-15 DIAGNOSIS — Z7985 Long-term (current) use of injectable non-insulin antidiabetic drugs: Secondary | ICD-10-CM

## 2023-12-15 DIAGNOSIS — G8929 Other chronic pain: Secondary | ICD-10-CM | POA: Diagnosis not present

## 2023-12-15 DIAGNOSIS — I82409 Acute embolism and thrombosis of unspecified deep veins of unspecified lower extremity: Secondary | ICD-10-CM

## 2023-12-15 DIAGNOSIS — E1122 Type 2 diabetes mellitus with diabetic chronic kidney disease: Secondary | ICD-10-CM

## 2023-12-15 DIAGNOSIS — M545 Low back pain, unspecified: Secondary | ICD-10-CM | POA: Diagnosis not present

## 2023-12-15 DIAGNOSIS — M25511 Pain in right shoulder: Secondary | ICD-10-CM

## 2023-12-15 DIAGNOSIS — N1832 Chronic kidney disease, stage 3b: Secondary | ICD-10-CM | POA: Diagnosis not present

## 2023-12-15 DIAGNOSIS — L989 Disorder of the skin and subcutaneous tissue, unspecified: Secondary | ICD-10-CM

## 2023-12-15 DIAGNOSIS — I10 Essential (primary) hypertension: Secondary | ICD-10-CM | POA: Diagnosis not present

## 2023-12-15 MED ORDER — CYCLOBENZAPRINE HCL 5 MG PO TABS: 5.0000 mg | ORAL_TABLET | Freq: Every evening | ORAL | 1 refills | Status: AC | PRN

## 2023-12-15 NOTE — Assessment & Plan Note (Signed)
 ROM limited due to pain Has been evaluated by orthopedic surgery for left shoulder pain, now has right shoulder pain Had relief with OT with left shoulder pain, but prefers to continue exercises at home for now Advised to use heating pad or ice for swelling/pain Voltaren gel PRN Unable to take oral NSAIDs due to CKD and anticoagulation, Tylenol as needed for pain

## 2023-12-15 NOTE — Assessment & Plan Note (Signed)
 BP Readings from Last 1 Encounters:  12/15/23 116/73   Well-controlled with losartan 25 mg daily Counseled for compliance with the medications Advised DASH diet and moderate exercise/walking, at least 150 mins/week

## 2023-12-15 NOTE — Patient Instructions (Signed)
 Please take Flexeril as needed for back muscle spasms.  Please continue shoulder exercises for now.  Please continue to take medications as prescribed.  Please continue to follow low carb diet and perform moderate exercise/walking at least 150 mins/week.

## 2023-12-15 NOTE — Assessment & Plan Note (Signed)
On Eliquis Follows up with Heme/Onc. 

## 2023-12-15 NOTE — Assessment & Plan Note (Signed)
 Last BMP reviewed, GFR stays around 40-45 Followed by Nephrology Advised to avoid oral NSAIDs On losartan Was on Sod Bicarb tablets - needs nephrology follow-up

## 2023-12-15 NOTE — Assessment & Plan Note (Addendum)
 HbA1c: 5.9 today  HbA1c was 6.5 in 07/24 Well-controlled now On Ozempic 1 mg qw Advised to follow diabetic diet F/u CMP, HbA1c and lipid panel Diabetic eye exam: Advised to follow up with Ophthalmology for diabetic eye exam

## 2023-12-15 NOTE — Assessment & Plan Note (Addendum)
 Likely has referred pain to right side of back and RLQ/flank area Tylenol PRN Unable to take NSAIDs Flexeril PRN for muscle spasms Offered PT, but patient wants to wait for now.

## 2023-12-15 NOTE — Progress Notes (Signed)
 Established Patient Office Visit  Subjective:  Patient ID: Bruce Adkins, male    DOB: 1956-06-11  Age: 68 y.o. MRN: [id number]  CC:  Chief Complaint  Patient presents with   Care Management    4 month f/u, reports side pain on right side and some shoulder pain. Ongoing for several years.     HPI Bruce Adkins is a 68 y.o. male with past medical history of recurrent unprovoked DVT, HTN, type 2 DM, BPH, HLD, anxiety and seasonal allergies who presents for f/u of his chronic medical conditions.  HTN: He has been taking losartan 25 mg QD now. BP is well-controlled. Takes medications regularly. Patient denies headache, dizziness, chest pain, dyspnea or palpitations.  His cough has resolved since stopping lisinopril.   Type 2 DM: His HbA1c is 5.9, improved from 6.5 in 07/24. He has started taking Ozempic 1 mg qw, and has been tolerating it except mild nausea. He has lost 30 lbs since starting Ozempic.  He denies any polyuria, polydipsia or chronic fatigue currently.  CKD: GFR was 46 in the last BMP. Followed by Nephrology. Denies any dysuria or hematuria.  BPH: Had Urolift procedure, but still had urinary retention.  He had TURP in 06/23.  He denies urinary retention, hesitance, resistance now. Denies any dysuria or hematuria currently.  He has stopped taking finasteride.  He has had erectile dysfunction since TURP, but take Cialis for it.  He reports acute on chronic right low back pain and right shoulder pain, which is intermittent, dull and worse at nighttime.  He has to change sides at nighttime due to discomfort in his low back.  He has tried taking baclofen in the past with some relief.  He takes Tylenol with mild relief as well.  Denies any numbness or tingling of the LE.  Denies saddle anesthesia, urinary or stool incontinence.   Past Medical History:  Diagnosis Date   Arthritis    BPH with obstruction/lower urinary tract symptoms    CKD (chronic kidney disease)    DVT (deep  venous thrombosis) (HCC) 2016   LLE   GERD (gastroesophageal reflux disease)    Hyperlipidemia    Hypertension    Panic attacks     Past Surgical History:  Procedure Laterality Date   BALLOON DILATION  12/30/2020   Procedure: BALLOON DILATION;  Surgeon: Dolores Frame, MD;  Location: [facility];  Service: Gastroenterology;;   BIOPSY  12/30/2020   Procedure: BIOPSY;  Surgeon: Dolores Frame, MD;  Location: [facility];  Service: Gastroenterology;;   COLONOSCOPY     COLONOSCOPY WITH PROPOFOL N/A 12/30/2020   Procedure: COLONOSCOPY WITH PROPOFOL;  Surgeon: Dolores Frame, MD;  Location: [facility];  Service: Gastroenterology;  Laterality: N/A;   ESOPHAGOGASTRODUODENOSCOPY (EGD) WITH PROPOFOL N/A 12/30/2020   Procedure: ESOPHAGOGASTRODUODENOSCOPY (EGD) WITH PROPOFOL;  Surgeon: Dolores Frame, MD;  Location: [facility];  Service: Gastroenterology;  Laterality: N/A;  am   TRANSURETHRAL RESECTION OF PROSTATE N/A 03/10/2022   Procedure: TRANSURETHRAL RESECTION OF THE PROSTATE (TURP);  Surgeon: Marcine Matar, MD;  Location[facility];  Service: Urology;  Laterality: N/A;    Family History  Problem Relation Age of Onset   Heart failure Mother    Hypertension Mother    Anxiety disorder Mother    Hypertension Father    Cancer Brother     Social History   Socioeconomic History   Marital status: Married    Spouse name: Bruce Nixon  Adkins   Number of children: 3   Years of education: Not on file   Highest education level: GED or equivalent  Occupational History   Not on file  Tobacco Use   Smoking status: Never   Smokeless tobacco: Never  Vaping Use   Vaping status: Never Used  Substance and Sexual Activity   Alcohol use: No   Drug use: Not Currently   Sexual activity: Yes  Other Topics Concern   Not on file  Social History Narrative   Not on file   Social Drivers of Health   Financial Resource Strain: Low Risk   (11/14/2023)   Overall Financial Resource Strain (CARDIA)    Difficulty of Paying Living Expenses: Not hard at all  Food Insecurity: No Food Insecurity (11/14/2023)   Hunger Vital Sign    Worried About Running Out of Food in the Last Year: Never true    Ran Out of Food in the Last Year: Never true  Transportation Needs: No Transportation Needs (11/14/2023)   PRAPARE - Administrator, Civil Service (Medical): No    Lack of Transportation (Non-Medical): No  Physical Activity: Insufficiently Active (11/14/2023)   Exercise Vital Sign    Days of Exercise per Week: 7 days    Minutes of Exercise per Session: 20 min  Stress: Stress Concern Present (11/14/2023)   [employer] of Occupational Health - Occupational Stress Questionnaire    Feeling of Stress : To some extent  Social Connections: Unknown (11/14/2023)   Social Connection and Isolation Panel [NHANES]    Frequency of Communication with Friends and Family: Patient declined    Frequency of Social Gatherings with Friends and Family: Patient declined    Attends Religious Services: More than 4 times per year    Active Member of [employer] or Organizations: Yes    Attends Engineer, structural: More than 4 times per year    Marital Status: Married  Catering manager Violence: Not At Risk (11/14/2023)   Humiliation, Afraid, Rape, and Kick questionnaire    Fear of Current or Ex-Partner: No    Emotionally Abused: No    Physically Abused: No    Sexually Abused: No    Outpatient Medications Prior to Visit  Medication Sig Dispense Refill   acetaminophen (TYLENOL) 500 MG tablet Take 1,000 mg by mouth every 6 (six) hours as needed for mild pain.     ALPRAZolam (XANAX) 0.25 MG tablet TAKE 1 TABLET BY MOUTH AT BEDTIME 30 tablet 0   atorvastatin (LIPITOR) 40 MG tablet Take 1 tablet (40 mg total) by mouth daily. 90 tablet 3   clotrimazole (LOTRIMIN) 1 % cream Apply 1 Application topically 2 (two) times daily. 30 g 0   ELIQUIS 5 MG  TABS tablet Take 1 tablet by mouth twice daily 180 tablet 0   fluticasone (FLONASE) 50 MCG/ACT nasal spray Place 2 sprays into both nostrils daily. 16 g 6   losartan (COZAAR) 25 MG tablet Take 1 tablet (25 mg total) by mouth daily. 90 tablet 0   Semaglutide, 1 MG/DOSE, 4 MG/3ML SOPN Inject 1 mg as directed once a week. 3 mL 3   tadalafil (CIALIS) 20 MG tablet 1/2 to 1 tab po prn 30 tablet 11   amoxicillin-clavulanate (AUGMENTIN) 875-125 MG tablet Take 1 tablet by mouth 2 (two) times daily. 14 tablet 0   terbinafine (LAMISIL) 250 MG tablet Take 1 tablet (250 mg total) by mouth daily. 14 tablet 0   No facility-administered medications prior  to visit.    No Known Allergies  ROS Review of Systems  Constitutional:  Negative for chills and fever.  HENT:  Negative for congestion and sore throat.   Eyes:  Negative for pain and discharge.  Respiratory:  Negative for cough and shortness of breath.   Cardiovascular:  Negative for chest pain and palpitations.  Gastrointestinal:  Negative for constipation, diarrhea and vomiting.  Endocrine: Negative for polydipsia and polyuria.  Genitourinary:  Positive for difficulty urinating. Negative for dysuria and hematuria.  Musculoskeletal:  Positive for arthralgias (R shoulder) and back pain. Negative for neck pain and neck stiffness.  Skin:  Negative for rash.  Neurological:  Negative for dizziness, weakness, numbness and headaches.  Psychiatric/Behavioral:  Negative for agitation and behavioral problems.       Objective:    Physical Exam Vitals reviewed.  Constitutional:      General: He is not in acute distress.    Appearance: He is not diaphoretic.  HENT:     Head: Normocephalic and atraumatic.     Nose: Nose normal.     Mouth/Throat:     Mouth: Mucous membranes are moist.  Eyes:     General: No scleral icterus.    Extraocular Movements: Extraocular movements intact.  Cardiovascular:     Rate and Rhythm: Normal rate and regular rhythm.      Heart sounds: Normal heart sounds. No murmur heard. Pulmonary:     Breath sounds: Normal breath sounds. No wheezing or rales.  Musculoskeletal:     Cervical back: Neck supple. No tenderness.     Lumbar back: Tenderness present. Negative right straight leg raise test and negative left straight leg raise test.     Right lower leg: No edema.     Left lower leg: No edema.  Skin:    General: Skin is warm.     Findings: No rash.  Neurological:     General: No focal deficit present.     Mental Status: He is alert and oriented to person, place, and time.     Sensory: No sensory deficit.     Motor: No weakness.  Psychiatric:        Mood and Affect: Mood normal.        Behavior: Behavior normal.     BP 116/73   Pulse 82   Ht 5\' 11"  (1.803 m)   Wt 190 lb 3.2 oz (86.3 kg)   SpO2 96%   BMI 26.53 kg/m  Wt Readings from Last 3 Encounters:  12/15/23 190 lb 3.2 oz (86.3 kg)  11/14/23 196 lb (88.9 kg)  10/09/23 196 lb 12.8 oz (89.3 kg)    Lab Results  Component Value Date   TSH 0.714 12/21/2022   Lab Results  Component Value Date   WBC 4.1 12/21/2022   HGB 14.3 12/21/2022   HCT 43.0 12/21/2022   MCV 94 12/21/2022   PLT 207 12/21/2022   Lab Results  Component Value Date   NA 139 08/15/2023   K 5.3 (H) 08/15/2023   CO2 24 08/15/2023   GLUCOSE 90 08/15/2023   BUN 8 11/07/2023   CREATININE 1.82 (H) 08/15/2023   BILITOT 0.4 08/15/2023   ALKPHOS 97 08/15/2023   AST 25 08/15/2023   ALT 19 08/15/2023   PROT 7.1 08/15/2023   ALBUMIN 4.4 08/15/2023   CALCIUM 9.3 08/15/2023   ANIONGAP 8 06/13/2021   EGFR 46 11/07/2023   Lab Results  Component Value Date   CHOL 119 08/15/2023  Lab Results  Component Value Date   HDL 41 08/15/2023   Lab Results  Component Value Date   LDLCALC 62 08/15/2023   Lab Results  Component Value Date   TRIG 80 08/15/2023   Lab Results  Component Value Date   CHOLHDL 2.9 08/15/2023   Lab Results  Component Value Date   HGBA1C 6.3  (H) 08/15/2023      Assessment & Plan:   Problem List Items Addressed This Visit       Cardiovascular and Mediastinum   Essential hypertension   BP Readings from Last 1 Encounters:  12/15/23 116/73   Well-controlled with losartan 25 mg daily Counseled for compliance with the medications Advised DASH diet and moderate exercise/walking, at least 150 mins/week      Recurrent deep vein thrombosis (DVT) (HCC)   On Eliquis Follows up with Heme/Onc.        Endocrine   Type 2 diabetes mellitus with stage 3b chronic kidney disease, without long-term current use of insulin (HCC) - Primary   HbA1c: 5.9 today  HbA1c was 6.5 in 07/24 Well-controlled now On Ozempic 1 mg qw Advised to follow diabetic diet F/u CMP, HbA1c and lipid panel Diabetic eye exam: Advised to follow up with Ophthalmology for diabetic eye exam      Relevant Orders   Bayer DCA Hb A1c Waived     Genitourinary   Stage 3b chronic kidney disease (HCC)   Last BMP reviewed, GFR stays around 40-45 Followed by Nephrology Advised to avoid oral NSAIDs On losartan Was on Sod Bicarb tablets - needs nephrology follow-up        Other   Chronic low back pain without sciatica   Likely has referred pain to right side of back and RLQ/flank area Tylenol PRN Unable to take NSAIDs Flexeril PRN for muscle spasms Offered PT, but patient wants to wait for now.      Relevant Medications   cyclobenzaprine (FLEXERIL) 5 MG tablet   Chronic right shoulder pain   ROM limited due to pain Has been evaluated by orthopedic surgery for left shoulder pain, now has right shoulder pain Had relief with OT with left shoulder pain, but prefers to continue exercises at home for now Advised to use heating pad or ice for swelling/pain Voltaren gel PRN Unable to take oral NSAIDs due to CKD and anticoagulation, Tylenol as needed for pain      Relevant Medications   cyclobenzaprine (FLEXERIL) 5 MG tablet    Meds ordered this  encounter  Medications   cyclobenzaprine (FLEXERIL) 5 MG tablet    Sig: Take 1 tablet (5 mg total) by mouth at bedtime as needed for muscle spasms.    Dispense:  30 tablet    Refill:  1    Follow-up: Return in about 4 months (around 04/15/2024) for DM.    Anabel Halon, MD

## 2023-12-18 LAB — BAYER DCA HB A1C WAIVED: HB A1C (BAYER DCA - WAIVED): 5.9 % — ABNORMAL HIGH (ref 4.8–5.6)

## 2024-02-05 ENCOUNTER — Other Ambulatory Visit: Payer: Self-pay | Admitting: Internal Medicine

## 2024-02-05 DIAGNOSIS — I10 Essential (primary) hypertension: Secondary | ICD-10-CM

## 2024-02-05 MED ORDER — LOSARTAN POTASSIUM 25 MG PO TABS
25.0000 mg | ORAL_TABLET | Freq: Every day | ORAL | 0 refills | Status: DC
Start: 2024-02-05 — End: 2024-04-16

## 2024-02-07 ENCOUNTER — Other Ambulatory Visit: Payer: Self-pay | Admitting: Internal Medicine

## 2024-02-07 DIAGNOSIS — E782 Mixed hyperlipidemia: Secondary | ICD-10-CM

## 2024-03-05 ENCOUNTER — Telehealth: Payer: Self-pay | Admitting: Pharmacist

## 2024-03-05 NOTE — Telephone Encounter (Signed)
   This patient is appearing on a report for being at risk of failing the adherence measure for diabetes medications this calendar year.   Medication: Ozempic  Last fill date: 01/08/24 for 28 day supply  MyChart message sent to patient. To see if medication assistance is needed.   Furqan Gosselin Dattero Belinda Bringhurst, PharmD, BCACP, CPP Clinical Pharmacist, [facility]

## 2024-03-07 ENCOUNTER — Other Ambulatory Visit (HOSPITAL_COMMUNITY): Payer: Self-pay

## 2024-03-19 ENCOUNTER — Encounter: Payer: Self-pay | Admitting: Internal Medicine

## 2024-03-20 DIAGNOSIS — L989 Disorder of the skin and subcutaneous tissue, unspecified: Secondary | ICD-10-CM | POA: Insufficient documentation

## 2024-03-20 NOTE — Assessment & Plan Note (Signed)
 Has recurrent skin lesions, erythematous papules, not seen today Also has rash on ankles Referred to Dermatology

## 2024-03-20 NOTE — Addendum Note (Signed)
 Addended byBETHA TOBIE DOWNS on: 03/20/2024 10:32 AM   Modules accepted: Orders

## 2024-03-21 ENCOUNTER — Other Ambulatory Visit: Payer: Self-pay | Admitting: Internal Medicine

## 2024-03-21 DIAGNOSIS — I82409 Acute embolism and thrombosis of unspecified deep veins of unspecified lower extremity: Secondary | ICD-10-CM

## 2024-04-16 ENCOUNTER — Ambulatory Visit (INDEPENDENT_AMBULATORY_CARE_PROVIDER_SITE_OTHER): Admitting: Internal Medicine

## 2024-04-16 ENCOUNTER — Encounter: Payer: Self-pay | Admitting: Internal Medicine

## 2024-04-16 VITALS — BP 129/82 | HR 68 | Ht 71.0 in | Wt 196.8 lb

## 2024-04-16 DIAGNOSIS — R338 Other retention of urine: Secondary | ICD-10-CM

## 2024-04-16 DIAGNOSIS — I10 Essential (primary) hypertension: Secondary | ICD-10-CM

## 2024-04-16 DIAGNOSIS — E1169 Type 2 diabetes mellitus with other specified complication: Secondary | ICD-10-CM

## 2024-04-16 DIAGNOSIS — E211 Secondary hyperparathyroidism, not elsewhere classified: Secondary | ICD-10-CM | POA: Diagnosis not present

## 2024-04-16 DIAGNOSIS — D631 Anemia in chronic kidney disease: Secondary | ICD-10-CM | POA: Diagnosis not present

## 2024-04-16 DIAGNOSIS — N401 Enlarged prostate with lower urinary tract symptoms: Secondary | ICD-10-CM

## 2024-04-16 DIAGNOSIS — N189 Chronic kidney disease, unspecified: Secondary | ICD-10-CM | POA: Diagnosis not present

## 2024-04-16 DIAGNOSIS — N1832 Chronic kidney disease, stage 3b: Secondary | ICD-10-CM

## 2024-04-16 DIAGNOSIS — E782 Mixed hyperlipidemia: Secondary | ICD-10-CM | POA: Diagnosis not present

## 2024-04-16 DIAGNOSIS — R809 Proteinuria, unspecified: Secondary | ICD-10-CM | POA: Diagnosis not present

## 2024-04-16 DIAGNOSIS — E1122 Type 2 diabetes mellitus with diabetic chronic kidney disease: Secondary | ICD-10-CM | POA: Diagnosis not present

## 2024-04-16 DIAGNOSIS — L239 Allergic contact dermatitis, unspecified cause: Secondary | ICD-10-CM | POA: Diagnosis not present

## 2024-04-16 MED ORDER — ATORVASTATIN CALCIUM 40 MG PO TABS: 40.0000 mg | ORAL_TABLET | Freq: Every day | ORAL | 3 refills | Status: AC

## 2024-04-16 MED ORDER — LOSARTAN POTASSIUM 25 MG PO TABS: 25.0000 mg | ORAL_TABLET | Freq: Every day | ORAL | 1 refills | Status: AC

## 2024-04-16 MED ORDER — TRIAMCINOLONE ACETONIDE 0.1 % EX CREA
1.0000 | TOPICAL_CREAM | Freq: Two times a day (BID) | CUTANEOUS | 0 refills | Status: AC
Start: 2024-04-16 — End: ?

## 2024-04-16 NOTE — Assessment & Plan Note (Signed)
 Last BMP reviewed, GFR stays around 40-45 Followed by Nephrology Advised to avoid oral NSAIDs On losartan Was on Sod Bicarb tablets - needs nephrology follow-up

## 2024-04-16 NOTE — Assessment & Plan Note (Signed)
 Legs and arms rash could be allergic dermatitis Trial of Kenalog  cream Has been referred to dermatology

## 2024-04-16 NOTE — Patient Instructions (Signed)
 Please continue to take medications as prescribed.  Please continue to follow low carb diet and perform moderate exercise/walking at least 150 mins/week.

## 2024-04-16 NOTE — Progress Notes (Signed)
 Established Patient Office Visit  Subjective:  Patient ID: Bruce Adkins, male    DOB: [birth year]  Age: 68 y.o. MRN: [id number]  CC:  Chief Complaint  Patient presents with   Medical Management of Chronic Issues    4 month f/u     HPI Bruce Adkins is a 68 y.o. male with past medical history of recurrent unprovoked DVT, HTN, type 2 DM, BPH, HLD, anxiety and seasonal allergies who presents for f/u of his chronic medical conditions.  HTN: He has been taking losartan  25 mg QD now. BP is well-controlled. Takes medications regularly. Patient denies headache, dizziness, chest pain, dyspnea or palpitations.  His cough has resolved since stopping lisinopril .   Type 2 DM: His HbA1c is 5.9, improved from 6.5 in 07/24. He has been taking Ozempic  1 mg qw, and has been tolerating it except mild nausea. He had lost 30 lbs since starting Ozempic .  He denies any polyuria, polydipsia or chronic fatigue currently.  CKD: GFR was 46 in the last BMP. Followed by Nephrology. Denies any dysuria or hematuria.  BPH: Had Urolift procedure, but still had urinary retention.  He had TURP in 06/23.  He denies urinary retention, hesitance, resistance now. Denies any dysuria or hematuria currently.  He has stopped taking finasteride .  He has had erectile dysfunction since TURP, but takes Cialis  for it.  Rash on legs: He reports bilateral itchy red spots on the legs and has noticed some spots on his arms as well.  He has been referred to dermatology.  He has tried applying cortisone cream at times with mild relief.   Past Medical History:  Diagnosis Date   Arthritis    BPH with obstruction/lower urinary tract symptoms    CKD (chronic kidney disease)    DVT (deep venous thrombosis) (HCC) 2016   LLE   GERD (gastroesophageal reflux disease)    Hyperlipidemia    Hypertension    Panic attacks     Past Surgical History:  Procedure Laterality Date   BALLOON DILATION  12/30/2020   Procedure: BALLOON DILATION;   Surgeon: Eartha Angelia Sieving, MD;  [facility];  Service: Gastroenterology;;   BIOPSY  12/30/2020   Procedure: BIOPSY;  Surgeon: Eartha Angelia Sieving, MD;  [facility];  Service: Gastroenterology;;   COLONOSCOPY     COLONOSCOPY WITH PROPOFOL  N/A 12/30/2020   Procedure: COLONOSCOPY WITH PROPOFOL ;  Surgeon: Eartha Angelia Sieving, MD;  [facility];  Service: Gastroenterology;  Laterality: N/A;   ESOPHAGOGASTRODUODENOSCOPY (EGD) WITH PROPOFOL  N/A 12/30/2020   Procedure: ESOPHAGOGASTRODUODENOSCOPY (EGD) WITH PROPOFOL ;  Surgeon: Eartha Angelia Sieving, MD;  [facility];  Service: Gastroenterology;  Laterality: N/A;  am   TRANSURETHRAL RESECTION OF PROSTATE N/A 03/10/2022   Procedure: TRANSURETHRAL RESECTION OF THE PROSTATE (TURP);  Surgeon: Matilda Senior, MD;  Location: [facility];  Service: Urology;  Laterality: N/A;    Family History  Problem Relation Age of Onset   Heart failure Mother    Hypertension Mother    Anxiety disorder Mother    Hypertension Father    Cancer Brother     Social History   Socioeconomic History   Marital status: Married    Spouse name: Cristopher Ciccarelli   Number of children: 3   Years of education: Not on file   Highest education level: GED or equivalent  Occupational History   Not on file  Tobacco Use   Smoking status: Never   Smokeless tobacco: Never  Vaping Use   Vaping status: Never Used  Substance and Sexual Activity   Alcohol use: No   Drug use: Not Currently   Sexual activity: Yes  Other Topics Concern   Not on file  Social History Narrative   Not on file   Social Drivers of Health   Financial Resource Strain: Low Risk  (11/14/2023)   Overall Financial Resource Strain (CARDIA)    Difficulty of Paying Living Expenses: Not hard at all  Food Insecurity: No Food Insecurity (11/14/2023)   Hunger Vital Sign    Worried About Running Out of Food in the Last Year: Never  true    Ran Out of Food in the Last Year: Never true  Transportation Needs: No Transportation Needs (11/14/2023)   PRAPARE - Administrator, Civil Service (Medical): No    Lack of Transportation (Non-Medical): No  Physical Activity: Insufficiently Active (11/14/2023)   Exercise Vital Sign    Days of Exercise per Week: 7 days    Minutes of Exercise per Session: 20 min  Stress: Stress Concern Present (11/14/2023)   [employer] of Occupational Health - Occupational Stress Questionnaire    Feeling of Stress : To some extent  Social Connections: Unknown (11/14/2023)   Social Connection and Isolation Panel    Frequency of Communication with Friends and Family: Patient declined    Frequency of Social Gatherings with Friends and Family: Patient declined    Attends Religious Services: More than 4 times per year    Active Member of [employer] or Organizations: Yes    Attends Engineer, structural: More than 4 times per year    Marital Status: Married  Catering manager Violence: Not At Risk (11/14/2023)   Humiliation, Afraid, Rape, and Kick questionnaire    Fear of Current or Ex-Partner: No    Emotionally Abused: No    Physically Abused: No    Sexually Abused: No    Outpatient Medications Prior to Visit  Medication Sig Dispense Refill   acetaminophen  (TYLENOL ) 500 MG tablet Take 1,000 mg by mouth every 6 (six) hours as needed for mild pain.     ALPRAZolam  (XANAX ) 0.25 MG tablet TAKE 1 TABLET BY MOUTH AT BEDTIME 30 tablet 0   cyclobenzaprine  (FLEXERIL ) 5 MG tablet Take 1 tablet (5 mg total) by mouth at bedtime as needed for muscle spasms. 30 tablet 1   ELIQUIS  5 MG TABS tablet Take 1 tablet by mouth twice daily 180 tablet 0   fluticasone  (FLONASE ) 50 MCG/ACT nasal spray Place 2 sprays into both nostrils daily. 16 g 6   Semaglutide , 1 MG/DOSE, 4 MG/3ML SOPN Inject 1 mg as directed once a week. 3 mL 3   tadalafil  (CIALIS ) 20 MG tablet 1/2 to 1 tab po prn 30 tablet 11    atorvastatin  (LIPITOR) 40 MG tablet Take 1 tablet by mouth once daily 90 tablet 0   clotrimazole  (LOTRIMIN ) 1 % cream Apply 1 Application topically 2 (two) times daily. 30 g 0   losartan  (COZAAR ) 25 MG tablet Take 1 tablet (25 mg total) by mouth daily. 90 tablet 0   No facility-administered medications prior to visit.    No Known Allergies  ROS Review of Systems  Constitutional:  Negative for chills and fever.  HENT:  Negative for congestion and sore throat.   Eyes:  Negative for pain and discharge.  Respiratory:  Negative for cough and shortness of breath.   Cardiovascular:  Negative for chest pain and palpitations.  Gastrointestinal:  Negative for constipation, diarrhea and vomiting.  Endocrine: Negative for polydipsia and polyuria.  Genitourinary:  Positive for difficulty urinating. Negative for dysuria and hematuria.  Musculoskeletal:  Positive for arthralgias (R shoulder) and back pain. Negative for neck pain and neck stiffness.  Skin:  Positive for rash.  Neurological:  Negative for dizziness and weakness.  Psychiatric/Behavioral:  Negative for agitation and behavioral problems.       Objective:    Physical Exam Vitals reviewed.  Constitutional:      General: He is not in acute distress.    Appearance: He is not diaphoretic.  HENT:     Head: Normocephalic and atraumatic.     Nose: Nose normal.     Mouth/Throat:     Mouth: Mucous membranes are moist.  Eyes:     General: No scleral icterus.    Extraocular Movements: Extraocular movements intact.  Cardiovascular:     Rate and Rhythm: Normal rate and regular rhythm.     Heart sounds: Normal heart sounds. No murmur heard. Pulmonary:     Breath sounds: Normal breath sounds. No wheezing or rales.  Musculoskeletal:     Cervical back: Neck supple. No tenderness.     Lumbar back: Tenderness present. Negative right straight leg raise test and negative left straight leg raise test.     Right lower leg: No edema.     Left  lower leg: No edema.  Skin:    General: Skin is warm.     Findings: Lesion (Erythematous macules over bilateral LE) present.  Neurological:     General: No focal deficit present.     Mental Status: He is alert and oriented to person, place, and time.     Sensory: No sensory deficit.     Motor: No weakness.  Psychiatric:        Mood and Affect: Mood normal.        Behavior: Behavior normal.     BP 129/82   Pulse 68   Ht 5' 11 (1.803 m)   Wt 196 lb 12.8 oz (89.3 kg)   SpO2 93%   BMI 27.45 kg/m  Wt Readings from Last 3 Encounters:  04/16/24 196 lb 12.8 oz (89.3 kg)  12/15/23 190 lb 3.2 oz (86.3 kg)  11/14/23 196 lb (88.9 kg)    Lab Results  Component Value Date   TSH 0.714 12/21/2022   Lab Results  Component Value Date   WBC 4.1 12/21/2022   HGB 14.3 12/21/2022   HCT 43.0 12/21/2022   MCV 94 12/21/2022   PLT 207 12/21/2022   Lab Results  Component Value Date   NA 139 08/15/2023   K 5.3 (H) 08/15/2023   CO2 24 08/15/2023   GLUCOSE 90 08/15/2023   BUN 8 11/07/2023   CREATININE 1.82 (H) 08/15/2023   BILITOT 0.4 08/15/2023   ALKPHOS 97 08/15/2023   AST 25 08/15/2023   ALT 19 08/15/2023   PROT 7.1 08/15/2023   ALBUMIN 4.4 08/15/2023   CALCIUM  9.3 08/15/2023   ANIONGAP 8 06/13/2021   EGFR 46 11/07/2023   Lab Results  Component Value Date   CHOL 119 08/15/2023   Lab Results  Component Value Date   HDL 41 08/15/2023   Lab Results  Component Value Date   LDLCALC 62 08/15/2023   Lab Results  Component Value Date   TRIG 80 08/15/2023   Lab Results  Component Value Date   CHOLHDL 2.9 08/15/2023   Lab Results  Component Value Date  HGBA1C 5.9 (H) 12/15/2023      Assessment & Plan:   Problem List Items Addressed This Visit       Cardiovascular and Mediastinum   Essential hypertension   BP Readings from Last 1 Encounters:  04/16/24 129/82   Well-controlled with losartan  25 mg daily Counseled for compliance with the medications Advised  DASH diet and moderate exercise/walking, at least 150 mins/week      Relevant Medications   losartan  (COZAAR ) 25 MG tablet   atorvastatin  (LIPITOR) 40 MG tablet   Other Relevant Orders   TSH   CBC with Differential/Platelet   CMP14+EGFR     Endocrine   Type 2 diabetes mellitus with other specified complication (HCC) - Primary   Lab Results  Component Value Date   HGBA1C 5.9 (H) 12/15/2023   HbA1c was 6.5 in 07/24 Well-controlled now Associated with HTN and HLD On Ozempic  1 mg qw Advised to follow diabetic diet F/u CMP, HbA1c and lipid panel Diabetic eye exam: Advised to follow up with Ophthalmology for diabetic eye exam      Relevant Medications   losartan  (COZAAR ) 25 MG tablet   atorvastatin  (LIPITOR) 40 MG tablet   Other Relevant Orders   Microalbumin / creatinine urine ratio   Hemoglobin A1c     Musculoskeletal and Integument   Allergic dermatitis   Legs and arms rash could be allergic dermatitis Trial of Kenalog  cream Has been referred to dermatology      Relevant Medications   triamcinolone  cream (KENALOG ) 0.1 %     Genitourinary   BPH (benign prostatic hyperplasia)   S/p Urolift and TURP, followed by Urology Check PSA      Relevant Orders   PSA   Stage 3b chronic kidney disease (HCC)   Last BMP reviewed, GFR stays around 40-45 Followed by Nephrology Advised to avoid oral NSAIDs On losartan  Was on Sod Bicarb tablets - needs nephrology follow-up      Relevant Orders   Microalbumin / creatinine urine ratio     Other   HLD (hyperlipidemia)   On Atorvastatin  Check lipid profile      Relevant Medications   losartan  (COZAAR ) 25 MG tablet   atorvastatin  (LIPITOR) 40 MG tablet   Other Relevant Orders   Lipid Profile    Meds ordered this encounter  Medications   losartan  (COZAAR ) 25 MG tablet    Sig: Take 1 tablet (25 mg total) by mouth daily.    Dispense:  90 tablet    Refill:  1   atorvastatin  (LIPITOR) 40 MG tablet    Sig: Take 1 tablet  (40 mg total) by mouth daily.    Dispense:  90 tablet    Refill:  3   triamcinolone  cream (KENALOG ) 0.1 %    Sig: Apply 1 Application topically 2 (two) times daily.    Dispense:  30 g    Refill:  0    Follow-up: Return in about 4 months (around 08/17/2024) for Annual physical.    Suzzane MARLA Blanch, MD

## 2024-04-16 NOTE — Assessment & Plan Note (Addendum)
 S/p Urolift and TURP, followed by Urology Check PSA

## 2024-04-16 NOTE — Assessment & Plan Note (Addendum)
 Lab Results  Component Value Date   HGBA1C 5.9 (H) 12/15/2023   HbA1c was 6.5 in 07/24 Well-controlled now Associated with HTN and HLD On Ozempic  1 mg qw Advised to follow diabetic diet F/u CMP, HbA1c and lipid panel Diabetic eye exam: Advised to follow up with Ophthalmology for diabetic eye exam

## 2024-04-16 NOTE — Assessment & Plan Note (Signed)
 BP Readings from Last 1 Encounters:  04/16/24 129/82   Well-controlled with losartan  25 mg daily Counseled for compliance with the medications Advised DASH diet and moderate exercise/walking, at least 150 mins/week

## 2024-04-16 NOTE — Assessment & Plan Note (Signed)
On Atorvastatin Check lipid profile

## 2024-04-17 ENCOUNTER — Ambulatory Visit: Payer: Self-pay | Admitting: Internal Medicine

## 2024-04-17 LAB — CBC WITH DIFFERENTIAL/PLATELET
Basophils Absolute: 0 x10E3/uL (ref 0.0–0.2)
Basos: 1 %
EOS (ABSOLUTE): 0.1 x10E3/uL (ref 0.0–0.4)
Eos: 2 %
Hematocrit: 42.7 % (ref 37.5–51.0)
Hemoglobin: 13.5 g/dL (ref 13.0–17.7)
Immature Grans (Abs): 0 x10E3/uL (ref 0.0–0.1)
Immature Granulocytes: 0 %
Lymphocytes Absolute: 1.5 x10E3/uL (ref 0.7–3.1)
Lymphs: 38 %
MCH: 31.1 pg (ref 26.6–33.0)
MCHC: 31.6 g/dL (ref 31.5–35.7)
MCV: 98 fL — ABNORMAL HIGH (ref 79–97)
Monocytes Absolute: 0.3 x10E3/uL (ref 0.1–0.9)
Monocytes: 8 %
Neutrophils Absolute: 2 x10E3/uL (ref 1.4–7.0)
Neutrophils: 51 %
Platelets: 196 x10E3/uL (ref 150–450)
RBC: 4.34 x10E6/uL (ref 4.14–5.80)
RDW: 13.1 % (ref 11.6–15.4)
WBC: 3.9 x10E3/uL (ref 3.4–10.8)

## 2024-04-17 LAB — CMP14+EGFR
ALT: 19 IU/L (ref 0–44)
AST: 23 IU/L (ref 0–40)
Albumin: 4.3 g/dL (ref 3.9–4.9)
Alkaline Phosphatase: 102 IU/L (ref 44–121)
BUN/Creatinine Ratio: 8 — ABNORMAL LOW (ref 10–24)
BUN: 15 mg/dL (ref 8–27)
Bilirubin Total: 0.2 mg/dL (ref 0.0–1.2)
CO2: 21 mmol/L (ref 20–29)
Calcium: 8.9 mg/dL (ref 8.6–10.2)
Chloride: 105 mmol/L (ref 96–106)
Creatinine, Ser: 1.95 mg/dL — ABNORMAL HIGH (ref 0.76–1.27)
Globulin, Total: 2.8 g/dL (ref 1.5–4.5)
Glucose: 87 mg/dL (ref 70–99)
Potassium: 4.9 mmol/L (ref 3.5–5.2)
Sodium: 141 mmol/L (ref 134–144)
Total Protein: 7.1 g/dL (ref 6.0–8.5)
eGFR: 37 mL/min/1.73 — ABNORMAL LOW (ref >59)

## 2024-04-17 LAB — PSA: Prostate Specific Ag, Serum: 1.3 ng/mL (ref 0.0–4.0)

## 2024-04-17 LAB — LIPID PANEL
Chol/HDL Ratio: 3 ratio (ref 0.0–5.0)
Cholesterol, Total: 161 mg/dL (ref 100–199)
HDL: 54 mg/dL (ref >39)
LDL Chol Calc (NIH): 92 mg/dL (ref 0–99)
Triglycerides: 81 mg/dL (ref 0–149)
VLDL Cholesterol Cal: 15 mg/dL (ref 5–40)

## 2024-04-17 LAB — HEMOGLOBIN A1C
Est. average glucose Bld gHb Est-mCnc: 126 mg/dL
Hgb A1c MFr Bld: 6 % — ABNORMAL HIGH (ref 4.8–5.6)

## 2024-04-17 LAB — TSH: TSH: 1.16 u[IU]/mL (ref 0.450–4.500)

## 2024-04-18 LAB — MICROALBUMIN / CREATININE URINE RATIO
Creatinine, Urine: 173.5 mg/dL
Microalb/Creat Ratio: 4 mg/g{creat} (ref 0–29)
Microalbumin, Urine: 7.2 ug/mL

## 2024-04-29 DIAGNOSIS — E1129 Type 2 diabetes mellitus with other diabetic kidney complication: Secondary | ICD-10-CM | POA: Diagnosis not present

## 2024-04-29 DIAGNOSIS — N1832 Chronic kidney disease, stage 3b: Secondary | ICD-10-CM | POA: Diagnosis not present

## 2024-04-29 DIAGNOSIS — E1122 Type 2 diabetes mellitus with diabetic chronic kidney disease: Secondary | ICD-10-CM | POA: Diagnosis not present

## 2024-05-22 ENCOUNTER — Ambulatory Visit: Admitting: Orthopedic Surgery

## 2024-05-30 ENCOUNTER — Other Ambulatory Visit: Payer: Self-pay | Admitting: Internal Medicine

## 2024-05-30 ENCOUNTER — Ambulatory Visit (INDEPENDENT_AMBULATORY_CARE_PROVIDER_SITE_OTHER): Admitting: Orthopedic Surgery

## 2024-05-30 ENCOUNTER — Other Ambulatory Visit (INDEPENDENT_AMBULATORY_CARE_PROVIDER_SITE_OTHER): Payer: Self-pay

## 2024-05-30 VITALS — BP 128/83 | HR 71 | Ht 71.0 in | Wt 195.0 lb

## 2024-05-30 DIAGNOSIS — M25511 Pain in right shoulder: Secondary | ICD-10-CM

## 2024-05-30 DIAGNOSIS — M19019 Primary osteoarthritis, unspecified shoulder: Secondary | ICD-10-CM

## 2024-05-30 DIAGNOSIS — M19011 Primary osteoarthritis, right shoulder: Secondary | ICD-10-CM | POA: Diagnosis not present

## 2024-05-30 DIAGNOSIS — G8929 Other chronic pain: Secondary | ICD-10-CM | POA: Diagnosis not present

## 2024-05-30 DIAGNOSIS — F419 Anxiety disorder, unspecified: Secondary | ICD-10-CM

## 2024-05-30 DIAGNOSIS — N1832 Chronic kidney disease, stage 3b: Secondary | ICD-10-CM

## 2024-05-30 DIAGNOSIS — M75101 Unspecified rotator cuff tear or rupture of right shoulder, not specified as traumatic: Secondary | ICD-10-CM | POA: Diagnosis not present

## 2024-05-30 MED ORDER — METHYLPREDNISOLONE ACETATE 40 MG/ML IJ SUSP
40.0000 mg | Freq: Once | INTRAMUSCULAR | Status: AC
  Administered 2024-05-30: 40 mg via INTRA_ARTICULAR

## 2024-05-30 NOTE — Progress Notes (Signed)
 Patient: Bruce Adkins           Date of Birth: [birth year]           MRN: [id number] Visit Date: 05/30/2024 Requested by: Tobie Suzzane POUR, MD [address] [place],  [place] [address] PCP: Tobie Suzzane POUR, MD   Chief Complaint  Patient presents with   Shoulder Pain   Encounter Diagnoses  Name Primary?   Rotator cuff syndrome of right shoulder Yes   Glenohumeral arthritis     Plan:   Subacromial injection right shoulder   Physical therapy   Return 8 weeks   Chief Complaint  Patient presents with   Shoulder Pain    68 year old male history of left shoulder pain treated with injection and physical therapy presents with right shoulder pain some weakness and decreased range of motion.  No trauma  Shoulder Pain  Pertinent negatives include no fever or tingling.    Body mass index is 27.2 kg/m.   Problem list, medical hx, medications and allergies reviewed   Review of Systems  Constitutional:  Negative for chills, fever and weight loss.  Respiratory:  Negative for shortness of breath.   Cardiovascular:  Negative for chest pain.  Neurological:  Negative for tingling.     No Known Allergies  BP 128/83   Pulse 71   Ht 5' 11 (1.803 m)   Wt 195 lb (88.5 kg)   BMI 27.20 kg/m    Physical exam: Physical Exam Vitals and nursing note reviewed.  Constitutional:      Appearance: Normal appearance.  HENT:     Head: Normocephalic and atraumatic.  Eyes:     General: No scleral icterus.       Right eye: No discharge.        Left eye: No discharge.     Extraocular Movements: Extraocular movements intact.     Conjunctiva/sclera: Conjunctivae normal.     Pupils: Pupils are equal, round, and reactive to light.  Cardiovascular:     Rate and Rhythm: Normal rate.     Pulses: Normal pulses.  Skin:    General: Skin is warm and dry.     Capillary Refill: Capillary refill takes less than 2 seconds.  Neurological:     General: No focal deficit present.     Mental  Status: He is alert and oriented to person, place, and time.  Psychiatric:        Mood and Affect: Mood normal.        Behavior: Behavior normal.        Thought Content: Thought content normal.        Judgment: Judgment normal.     Ortho Exam  MSK:  The patient exhibits full forward elevation in the frontal plane abduction 80 degrees internal rotation to his hip pocket.  Painful range of motion from 90 to 150 degrees  Data reviewed:   Image(s) reviewed with personal interpretation:   DG Shoulder Right Result Date: 05/30/2024 X-ray right shoulder AP lateral right shoulder for right shoulder pain and loss of motion The humeral head looks normal and the glenoid looks normal.  There may be slight narrowing of the glenohumeral joint space.  There is some sclerosis of the greater tuberosity.  [facility] joint is poorly visualized but looks okay.  Humeral shaft looks normal as well.  No lesions in the chest wall. Mild glenohumeral arthritis     Assessment and plan:  Encounter Diagnoses  Name Primary?   Rotator cuff  syndrome of right shoulder Yes   Glenohumeral arthritis      Meds ordered this encounter  Medications   methylPREDNISolone  acetate (DEPO-MEDROL ) injection 40 mg    Procedures:     Procedure note the subacromial injection shoulder RIGHT    Verbal consent was obtained to inject the  RIGHT   Shoulder  Timeout was completed to confirm the injection site is a subacromial space of the  RIGHT  shoulder   Medication used Depo-Medrol  40 mg and lidocaine  1% 3 cc  Anesthesia was provided by ethyl chloride  The injection was performed in the RIGHT  posterior subacromial space. After pinning the skin with alcohol and anesthetized the skin with ethyl chloride the subacromial space was injected using a 20-gauge needle. There were no complications  Sterile dressing was applied.

## 2024-05-30 NOTE — Progress Notes (Signed)
  Intake history:  Chief Complaint  Patient presents with   Shoulder Pain     BP 128/83   Pulse 71   Ht 5' 11 (1.803 m)   Wt 195 lb (88.5 kg)   BMI 27.20 kg/m  Body mass index is 27.2 kg/m.    WHAT ARE WE SEEING YOU FOR TODAY?   right shoulder  How long has this bothered you? (DOI?DOS?WS?)    Was there an injury? No  Anticoag.  Yes  Diabetes Yes  Heart disease No  Hypertension Yes  SMOKING HX No  Kidney disease No  Any ALLERGIES ______________________________________________   Treatment:  Have you taken:  Tylenol  Yes  Advil No  Had PT No  Had injection No  Other  _________________________

## 2024-05-30 NOTE — Patient Instructions (Signed)
 Physical therapy has been ordered for you at [facility]. They should call you to schedule, [phone] is the phone number to call, if you want to call to schedule.

## 2024-07-23 ENCOUNTER — Other Ambulatory Visit: Payer: Self-pay | Admitting: Internal Medicine

## 2024-07-23 DIAGNOSIS — I82409 Acute embolism and thrombosis of unspecified deep veins of unspecified lower extremity: Secondary | ICD-10-CM

## 2024-07-26 ENCOUNTER — Ambulatory Visit: Admitting: Orthopedic Surgery

## 2024-08-07 ENCOUNTER — Ambulatory Visit: Payer: Self-pay

## 2024-08-07 DIAGNOSIS — Z23 Encounter for immunization: Secondary | ICD-10-CM | POA: Diagnosis not present

## 2024-08-12 NOTE — Progress Notes (Signed)
 Impression/Assessment:  BPH w/ retention--excellent symptomatic response to eventual TURP.  He still carries a fair residual but I think this is normal for him  Plan:  I will have him come back in 1 year for recheck  History of Present Illness:   Bruce Adkins is here for continued attention to BPH.   4.26.2022: Initial presentation w/ AUR w/ (B) hydro, resolved with catheter placement. Multiple voiding trial on med Rx unsuccessful.   9.26.2022: Urolift performed (TURP recommended). Prostate volume 47 mL. 7 implants placed.   12.6.2022: He is self catheterizing 3 times a day.  He does have spontaneous voids, usually early in the morning and later in the daytime.  In between, he does not void that much.  No problems with catheterizing, no blood in the urine, no dysuria.  Renal function is stable, followed by Dr. Rachele.  Follow-up renal ultrasound revealed no residual hydronephrosis.  There was bladder wall thickening.   3.28.2023: Here for recheck.  He still is on self-catheterization on average about 5 times every 24 hours.  No problems with catheterization.  He has had no significantly cloudy urine or blood.  He has rare spontaneous voids but he does have occasional feelings of urgency.   He underwent urodynamics.  There is obstruction.  He has mild to moderate contractility of the bladder.   TURP performed on 6.15.2023.  Pathology benign--17 gms resected.  He did not void well postoperatively and did self-catheterization for some time.  11.18.2025:    Past Medical History:  Diagnosis Date   Arthritis    BPH with obstruction/lower urinary tract symptoms    CKD (chronic kidney disease)    DVT (deep venous thrombosis) (HCC) 2016   LLE   GERD (gastroesophageal reflux disease)    Hyperlipidemia    Hypertension    Panic attacks     Past Surgical History:  Procedure Laterality Date   BALLOON DILATION  12/30/2020   Procedure: BALLOON DILATION;  Surgeon: Eartha Angelia Sieving, MD;   [facility];  Service: Gastroenterology;;   BIOPSY  12/30/2020   Procedure: BIOPSY;  Surgeon: Eartha Angelia Sieving, MD;  [facility];  Service: Gastroenterology;;   COLONOSCOPY     COLONOSCOPY WITH PROPOFOL  N/A 12/30/2020   Procedure: COLONOSCOPY WITH PROPOFOL ;  Surgeon: Eartha Angelia Sieving, MD;  [facility];  Service: Gastroenterology;  Laterality: N/A;   ESOPHAGOGASTRODUODENOSCOPY (EGD) WITH PROPOFOL  N/A 12/30/2020   Procedure: ESOPHAGOGASTRODUODENOSCOPY (EGD) WITH PROPOFOL ;  Surgeon: Eartha Angelia Sieving, MD;  [facility];  Service: Gastroenterology;  Laterality: N/A;  am   TRANSURETHRAL RESECTION OF PROSTATE N/A 03/10/2022   Procedure: TRANSURETHRAL RESECTION OF THE PROSTATE (TURP);  Surgeon: Matilda Senior, MD;  Location: [facility];  Service: Urology;  Laterality: N/A;    Home Medications:  Allergies as of 08/13/2024   No Known Allergies      Medication List        Accurate as of August 12, 2024 11:45 AM. If you have any questions, ask your nurse or doctor.          acetaminophen  500 MG tablet Commonly known as: TYLENOL  Take 1,000 mg by mouth every 6 (six) hours as needed for mild pain.   ALPRAZolam  0.25 MG tablet Commonly known as: XANAX  TAKE 1 TABLET BY MOUTH AT BEDTIME   atorvastatin  40 MG tablet Commonly known as: LIPITOR Take 1 tablet (40 mg total) by mouth daily.   cyclobenzaprine  5 MG tablet Commonly known as:  FLEXERIL  Take 1 tablet (5 mg total) by mouth at bedtime as needed for muscle spasms.   Eliquis  5 MG Tabs tablet Generic drug: apixaban  Take 1 tablet by mouth twice daily   fluticasone  50 MCG/ACT nasal spray Commonly known as: FLONASE  Place 2 sprays into both nostrils daily.   losartan  25 MG tablet Commonly known as: COZAAR  Take 1 tablet (25 mg total) by mouth daily.   Ozempic  (1 MG/DOSE) 4 MG/3ML Sopn Generic drug: Semaglutide  (1 MG/DOSE) INJECT 1 MG  SUBCUTANEOUSLY AS DIRECTED ONCE A WEEK   tadalafil  20 MG tablet Commonly known as: CIALIS  1/2 to 1 tab po prn   triamcinolone  cream 0.1 % Commonly known as: KENALOG  Apply 1 Application topically 2 (two) times daily.        Allergies: No Known Allergies  Family History  Problem Relation Age of Onset   Heart failure Mother    Hypertension Mother    Anxiety disorder Mother    Hypertension Father    Cancer Brother     Social History:  reports that he has never smoked. He has never used smokeless tobacco. He reports that he does not currently use drugs. He reports that he does not drink alcohol.  ROS: A complete review of systems was performed.  All systems are negative except for pertinent findings as noted.  Physical Exam:  Vital signs in last 24 hours: There were no vitals taken for this visit. Constitutional:  Alert and oriented, No acute distress Cardiovascular: Regular rate  Respiratory: Normal respiratory effort Psychiatric: Normal mood and affect  I have reviewed prior pt notes  I have reviewed urinalysis results--clear  I have independently reviewed prior imaging--urinary volume today 325 mL  I have reviewed prior PSA results  IPSS sheet reviewed

## 2024-08-13 ENCOUNTER — Ambulatory Visit: Payer: Medicare HMO | Admitting: Urology

## 2024-08-13 VITALS — BP 109/61 | HR 80

## 2024-08-13 DIAGNOSIS — R338 Other retention of urine: Secondary | ICD-10-CM

## 2024-08-13 DIAGNOSIS — N401 Enlarged prostate with lower urinary tract symptoms: Secondary | ICD-10-CM | POA: Diagnosis not present

## 2024-08-13 DIAGNOSIS — N138 Other obstructive and reflux uropathy: Secondary | ICD-10-CM | POA: Diagnosis not present

## 2024-08-13 DIAGNOSIS — R339 Retention of urine, unspecified: Secondary | ICD-10-CM

## 2024-08-13 LAB — URINALYSIS, ROUTINE W REFLEX MICROSCOPIC
Bilirubin, UA: NEGATIVE
Glucose, UA: NEGATIVE
Ketones, UA: NEGATIVE
Leukocytes,UA: NEGATIVE
Nitrite, UA: NEGATIVE
Protein,UA: NEGATIVE
RBC, UA: NEGATIVE
Specific Gravity, UA: 1.02 (ref 1.005–1.030)
Urobilinogen, Ur: 0.2 mg/dL (ref 0.2–1.0)
pH, UA: 6 (ref 5.0–7.5)

## 2024-08-26 ENCOUNTER — Ambulatory Visit: Admitting: Internal Medicine

## 2024-08-26 ENCOUNTER — Other Ambulatory Visit (HOSPITAL_COMMUNITY): Payer: Self-pay

## 2024-08-26 ENCOUNTER — Encounter: Payer: Self-pay | Admitting: Internal Medicine

## 2024-08-26 ENCOUNTER — Telehealth: Payer: Self-pay | Admitting: Pharmacy Technician

## 2024-08-26 VITALS — BP 115/76 | HR 83 | Ht 71.0 in | Wt 193.2 lb

## 2024-08-26 DIAGNOSIS — I10 Essential (primary) hypertension: Secondary | ICD-10-CM

## 2024-08-26 DIAGNOSIS — N1832 Chronic kidney disease, stage 3b: Secondary | ICD-10-CM | POA: Diagnosis not present

## 2024-08-26 DIAGNOSIS — E1169 Type 2 diabetes mellitus with other specified complication: Secondary | ICD-10-CM | POA: Diagnosis not present

## 2024-08-26 DIAGNOSIS — F419 Anxiety disorder, unspecified: Secondary | ICD-10-CM

## 2024-08-26 DIAGNOSIS — I82409 Acute embolism and thrombosis of unspecified deep veins of unspecified lower extremity: Secondary | ICD-10-CM

## 2024-08-26 DIAGNOSIS — Z0001 Encounter for general adult medical examination with abnormal findings: Secondary | ICD-10-CM

## 2024-08-26 DIAGNOSIS — N401 Enlarged prostate with lower urinary tract symptoms: Secondary | ICD-10-CM

## 2024-08-26 DIAGNOSIS — R110 Nausea: Secondary | ICD-10-CM

## 2024-08-26 MED ORDER — ONDANSETRON HCL 4 MG PO TABS: 4.0000 mg | ORAL_TABLET | Freq: Three times a day (TID) | ORAL | 1 refills | Status: DC | PRN

## 2024-08-26 NOTE — Patient Instructions (Signed)
 Please take Zofran  as needed for nausea.  Please continue to take medications as prescribed.  Please continue to follow low carb diet and perform moderate exercise/walking at least 150 mins/week.  Please consider getting Tdap vaccine at local pharmacy.

## 2024-08-26 NOTE — Assessment & Plan Note (Signed)
On Eliquis Follows up with Heme/Onc. 

## 2024-08-26 NOTE — Assessment & Plan Note (Addendum)
 Lab Results  Component Value Date   HGBA1C 6.0 (H) 04/16/2024   HbA1c was 6.5 in 07/24 Well-controlled now Associated with HTN and HLD On Ozempic  1 mg qw, has mild nausea, prescribed Zofran  as needed for nausea - advised to follow small, frequent meals Advised to follow diabetic diet F/u CMP, HbA1c and lipid panel Diabetic eye exam: Advised to follow up with Ophthalmology for diabetic eye exam

## 2024-08-26 NOTE — Assessment & Plan Note (Addendum)
Physical exam as documented. Fasting blood tests reviewed. 

## 2024-08-26 NOTE — Assessment & Plan Note (Signed)
 Takes Xanax PRN, needs it occasionally - refilled PDMP reviewed

## 2024-08-26 NOTE — Progress Notes (Signed)
 Established Patient Office Visit  Subjective:  Patient ID: Bruce Adkins, male    DOB: June 09, 1956  Age: 68 y.o. MRN: [id number]  CC:  Chief Complaint  Patient presents with   Annual Exam    Cpe / 4 month   Hypertension    4 month f/u    Diabetes    4 month f/u     HPI Bruce Adkins is a 68 y.o. male with past medical history of recurrent unprovoked DVT, HTN, type 2 DM, BPH, HLD, anxiety and seasonal allergies who presents for f/u of his chronic medical conditions.  HTN: He has been taking losartan  25 mg QD now. BP is well-controlled. Takes medications regularly. Patient denies headache, dizziness, chest pain, dyspnea or palpitations.  His cough has resolved since stopping lisinopril .   Type 2 DM: His HbA1c is 5.9, improved from 6.5 in 07/24. He has been taking Ozempic  1 mg qw, and has been tolerating it except mild nausea. He had lost 33 lbs since starting Ozempic .  He denies any polyuria, polydipsia or chronic fatigue currently.  CKD: GFR was 37 in the last BMP. Followed by Nephrology. Denies any dysuria or hematuria.  BPH: Had Urolift procedure, but still had urinary retention.  He had TURP in 06/23.  He denies urinary retention, hesitance, resistance now. Denies any dysuria or hematuria currently.  He has stopped taking finasteride .  He has had erectile dysfunction since TURP, but takes Cialis  for it.  Rash on legs: He reports bilateral itchy red spots on the legs and has noticed some spots on his arms as well.  He has been referred to dermatology.  He has tried applying cortisone cream at times with mild relief.   Past Medical History:  Diagnosis Date   Arthritis    BPH with obstruction/lower urinary tract symptoms    CKD (chronic kidney disease)    DVT (deep venous thrombosis) (HCC) 2016   LLE   GERD (gastroesophageal reflux disease)    Hyperlipidemia    Hypertension    Panic attacks     Past Surgical History:  Procedure Laterality Date   BALLOON DILATION   12/30/2020   Procedure: BALLOON DILATION;  Surgeon: Eartha Angelia Sieving, MD;  Location: [facility];  Service: Gastroenterology;;   BIOPSY  12/30/2020   Procedure: BIOPSY;  Surgeon: Eartha Angelia Sieving, MD;  Location: [facility];  Service: Gastroenterology;;   COLONOSCOPY     COLONOSCOPY WITH PROPOFOL  N/A 12/30/2020   Procedure: COLONOSCOPY WITH PROPOFOL ;  Surgeon: Eartha Angelia Sieving, MD;  Location: [facility];  Service: Gastroenterology;  Laterality: N/A;   ESOPHAGOGASTRODUODENOSCOPY (EGD) WITH PROPOFOL  N/A 12/30/2020   Procedure: ESOPHAGOGASTRODUODENOSCOPY (EGD) WITH PROPOFOL ;  Surgeon: Eartha Angelia Sieving, MD;  Location: [facility];  Service: Gastroenterology;  Laterality: N/A;  am   TRANSURETHRAL RESECTION OF PROSTATE N/A 03/10/2022   Procedure: TRANSURETHRAL RESECTION OF THE PROSTATE (TURP);  Surgeon: Matilda Senior, MD;  Location: [facility];  Service: Urology;  Laterality: N/A;    Family History  Problem Relation Age of Onset   Heart failure Mother    Hypertension Mother    Anxiety disorder Mother    Hypertension Father    Cancer Brother     Social History   Socioeconomic History   Marital status: Married    Spouse name: Danial Hlavac   Number of children: 3   Years of education: Not on file   Highest education level: GED or equivalent  Occupational History  Not on file  Tobacco Use   Smoking status: Never   Smokeless tobacco: Never  Vaping Use   Vaping status: Never Used  Substance and Sexual Activity   Alcohol use: No   Drug use: Not Currently   Sexual activity: Yes  Other Topics Concern   Not on file  Social History Narrative   Not on file   Social Drivers of Health   Financial Resource Strain: Low Risk  (11/14/2023)   Overall Financial Resource Strain (CARDIA)    Difficulty of Paying Living Expenses: Not hard at all  Food Insecurity: No Food Insecurity (11/14/2023)   Hunger Vital Sign    Worried About  Running Out of Food in the Last Year: Never true    Ran Out of Food in the Last Year: Never true  Transportation Needs: No Transportation Needs (11/14/2023)   PRAPARE - Administrator, Civil Service (Medical): No    Lack of Transportation (Non-Medical): No  Physical Activity: Insufficiently Active (11/14/2023)   Exercise Vital Sign    Days of Exercise per Week: 7 days    Minutes of Exercise per Session: 20 min  Stress: Stress Concern Present (11/14/2023)   [employer] of Occupational Health - Occupational Stress Questionnaire    Feeling of Stress : To some extent  Social Connections: Unknown (11/14/2023)   Social Connection and Isolation Panel    Frequency of Communication with Friends and Family: Patient declined    Frequency of Social Gatherings with Friends and Family: Patient declined    Attends Religious Services: More than 4 times per year    Active Member of [employer] or Organizations: Yes    Attends Engineer, Structural: More than 4 times per year    Marital Status: Married  Catering Manager Violence: Not At Risk (11/14/2023)   Humiliation, Afraid, Rape, and Kick questionnaire    Fear of Current or Ex-Partner: No    Emotionally Abused: No    Physically Abused: No    Sexually Abused: No    Outpatient Medications Prior to Visit  Medication Sig Dispense Refill   acetaminophen  (TYLENOL ) 500 MG tablet Take 1,000 mg by mouth every 6 (six) hours as needed for mild pain.     ALPRAZolam  (XANAX ) 0.25 MG tablet TAKE 1 TABLET BY MOUTH AT BEDTIME 30 tablet 0   atorvastatin  (LIPITOR) 40 MG tablet Take 1 tablet (40 mg total) by mouth daily. 90 tablet 3   cyclobenzaprine  (FLEXERIL ) 5 MG tablet Take 1 tablet (5 mg total) by mouth at bedtime as needed for muscle spasms. 30 tablet 1   ELIQUIS  5 MG TABS tablet Take 1 tablet by mouth twice daily 180 tablet 0   fluticasone  (FLONASE ) 50 MCG/ACT nasal spray Place 2 sprays into both nostrils daily. 16 g 6   losartan  (COZAAR ) 25  MG tablet Take 1 tablet (25 mg total) by mouth daily. 90 tablet 1   Semaglutide , 1 MG/DOSE, (OZEMPIC , 1 MG/DOSE,) 4 MG/3ML SOPN INJECT 1 MG SUBCUTANEOUSLY AS DIRECTED ONCE A WEEK 3 mL 3   tadalafil  (CIALIS ) 20 MG tablet 1/2 to 1 tab po prn 30 tablet 11   triamcinolone  cream (KENALOG ) 0.1 % Apply 1 Application topically 2 (two) times daily. 30 g 0   No facility-administered medications prior to visit.    No Known Allergies  ROS Review of Systems  Constitutional:  Negative for chills and fever.  HENT:  Negative for congestion and sore throat.   Eyes:  Negative for pain and  discharge.  Respiratory:  Negative for cough and shortness of breath.   Cardiovascular:  Negative for chest pain and palpitations.  Gastrointestinal:  Positive for nausea. Negative for constipation, diarrhea and vomiting.  Endocrine: Negative for polydipsia and polyuria.  Genitourinary:  Positive for difficulty urinating. Negative for dysuria and hematuria.  Musculoskeletal:  Positive for arthralgias (R shoulder) and back pain. Negative for neck pain and neck stiffness.  Skin:  Positive for rash.  Neurological:  Negative for dizziness and weakness.  Psychiatric/Behavioral:  Negative for agitation and behavioral problems.       Objective:    Physical Exam Vitals reviewed.  Constitutional:      General: He is not in acute distress.    Appearance: He is not diaphoretic.  HENT:     Head: Normocephalic and atraumatic.     Nose: Nose normal.     Mouth/Throat:     Mouth: Mucous membranes are moist.  Eyes:     General: No scleral icterus.    Extraocular Movements: Extraocular movements intact.  Cardiovascular:     Rate and Rhythm: Normal rate and regular rhythm.     Heart sounds: Normal heart sounds. No murmur heard. Pulmonary:     Breath sounds: Normal breath sounds. No wheezing or rales.  Abdominal:     Palpations: Abdomen is soft.     Tenderness: There is no abdominal tenderness.  Musculoskeletal:      Cervical back: Neck supple. No tenderness.     Lumbar back: Tenderness present. Negative right straight leg raise test and negative left straight leg raise test.     Right lower leg: No edema.     Left lower leg: No edema.  Skin:    General: Skin is warm.     Findings: Lesion (Erythematous macules over bilateral LE) present.  Neurological:     General: No focal deficit present.     Mental Status: He is alert and oriented to person, place, and time.     Cranial Nerves: No cranial nerve deficit.     Sensory: No sensory deficit.     Motor: No weakness.  Psychiatric:        Mood and Affect: Mood normal.        Behavior: Behavior normal.     BP 115/76   Pulse 83   Ht 5' 11 (1.803 m)   Wt 193 lb 3.2 oz (87.6 kg)   SpO2 98%   BMI 26.95 kg/m  Wt Readings from Last 3 Encounters:  08/26/24 193 lb 3.2 oz (87.6 kg)  05/30/24 195 lb (88.5 kg)  04/16/24 196 lb 12.8 oz (89.3 kg)    Lab Results  Component Value Date   TSH 1.160 04/16/2024   Lab Results  Component Value Date   WBC 3.9 04/16/2024   HGB 13.5 04/16/2024   HCT 42.7 04/16/2024   MCV 98 (H) 04/16/2024   PLT 196 04/16/2024   Lab Results  Component Value Date   NA 141 04/16/2024   K 4.9 04/16/2024   CO2 21 04/16/2024   GLUCOSE 87 04/16/2024   BUN 15 04/16/2024   CREATININE 1.95 (H) 04/16/2024   BILITOT 0.2 04/16/2024   ALKPHOS 102 04/16/2024   AST 23 04/16/2024   ALT 19 04/16/2024   PROT 7.1 04/16/2024   ALBUMIN 4.3 04/16/2024   CALCIUM  8.9 04/16/2024   ANIONGAP 8 06/13/2021   EGFR 37 (L) 04/16/2024   Lab Results  Component Value Date   CHOL 161 04/16/2024   Lab  Results  Component Value Date   HDL 54 04/16/2024   Lab Results  Component Value Date   LDLCALC 92 04/16/2024   Lab Results  Component Value Date   TRIG 81 04/16/2024   Lab Results  Component Value Date   CHOLHDL 3.0 04/16/2024   Lab Results  Component Value Date   HGBA1C 6.0 (H) 04/16/2024      Assessment & Plan:   Problem  List Items Addressed This Visit       Cardiovascular and Mediastinum   Essential hypertension   BP Readings from Last 1 Encounters:  08/26/24 115/76   Well-controlled with losartan  25 mg daily Counseled for compliance with the medications Advised DASH diet and moderate exercise/walking, at least 150 mins/week      Recurrent deep vein thrombosis (DVT) (HCC)   On Eliquis  Follows up with Heme/Onc.        Endocrine   Type 2 diabetes mellitus with other specified complication (HCC)   Lab Results  Component Value Date   HGBA1C 6.0 (H) 04/16/2024   HbA1c was 6.5 in 07/24 Well-controlled now Associated with HTN and HLD On Ozempic  1 mg qw, has mild nausea, prescribed Zofran  as needed for nausea - advised to follow small, frequent meals Advised to follow diabetic diet F/u CMP, HbA1c and lipid panel Diabetic eye exam: Advised to follow up with Ophthalmology for diabetic eye exam      Relevant Orders   Hemoglobin A1c   Basic Metabolic Panel (BMET)     Genitourinary   BPH (benign prostatic hyperplasia)   S/p Urolift and TURP, followed by Urology Checked PSA - WNL      Stage 3b chronic kidney disease (HCC)   Last BMP reviewed, GFR stays around 40-45 Followed by Nephrology Advised to avoid oral NSAIDs On losartan       Relevant Orders   Basic Metabolic Panel (BMET)     Other   Anxiety   Takes Xanax  PRN, needs it occasionally - refilled PDMP reviewed      Encounter for general adult medical examination with abnormal findings - Primary   Physical exam as documented. Fasting blood tests reviewed.      Other Visit Diagnoses       Nausea       Relevant Medications   ondansetron  (ZOFRAN ) 4 MG tablet       Meds ordered this encounter  Medications   ondansetron  (ZOFRAN ) 4 MG tablet    Sig: Take 1 tablet (4 mg total) by mouth every 8 (eight) hours as needed for nausea or vomiting.    Dispense:  20 tablet    Refill:  1    Follow-up: Return in about 4 months  (around 12/25/2024) for DM.    Suzzane MARLA Blanch, MD

## 2024-08-26 NOTE — Assessment & Plan Note (Addendum)
 S/p Urolift and TURP, followed by Urology Checked PSA - WNL

## 2024-08-26 NOTE — Assessment & Plan Note (Addendum)
 Last BMP reviewed, GFR stays around 40-45 Followed by Nephrology Advised to avoid oral NSAIDs On losartan 

## 2024-08-26 NOTE — Telephone Encounter (Signed)
 Pharmacy Patient Advocate Encounter   Received notification from CoverMyMeds that prior authorization for Ondansetron  HCl 4MG  tablets is required/requested.   Insurance verification completed.   The patient is insured through [facility] ADVANTAGE/RX ADVANCE.   Per test claim: PA required; PA submitted to above mentioned insurance via Latent Key/confirmation #/EOC [id number] Status is pending

## 2024-08-26 NOTE — Assessment & Plan Note (Signed)
 BP Readings from Last 1 Encounters:  08/26/24 115/76   Well-controlled with losartan  25 mg daily Counseled for compliance with the medications Advised DASH diet and moderate exercise/walking, at least 150 mins/week

## 2024-08-27 ENCOUNTER — Ambulatory Visit: Payer: Self-pay | Admitting: Internal Medicine

## 2024-08-27 LAB — BASIC METABOLIC PANEL WITH GFR
BUN/Creatinine Ratio: 11 (ref 10–24)
BUN: 18 mg/dL (ref 8–27)
CO2: 22 mmol/L (ref 20–29)
Calcium: 9.1 mg/dL (ref 8.6–10.2)
Chloride: 101 mmol/L (ref 96–106)
Creatinine, Ser: 1.66 mg/dL — ABNORMAL HIGH (ref 0.76–1.27)
Glucose: 84 mg/dL (ref 70–99)
Potassium: 4.8 mmol/L (ref 3.5–5.2)
Sodium: 136 mmol/L (ref 134–144)
eGFR: 45 mL/min/1.73 — ABNORMAL LOW (ref >59)

## 2024-08-27 LAB — HEMOGLOBIN A1C
Est. average glucose Bld gHb Est-mCnc: 123 mg/dL
Hgb A1c MFr Bld: 5.9 % — ABNORMAL HIGH (ref 4.8–5.6)

## 2024-08-27 MED ORDER — METOCLOPRAMIDE HCL 5 MG PO TABS: 5.0000 mg | ORAL_TABLET | Freq: Three times a day (TID) | ORAL | 1 refills | Status: AC | PRN

## 2024-08-27 NOTE — Telephone Encounter (Signed)
 Pharmacy Patient Advocate Encounter  Received notification from [facility] ADVANTAGE/RX ADVANCE that Prior Authorization for Ondansetron  HCl 4MG  tablets has been DENIED.  Full denial letter will be uploaded to the media tab. See denial reason below.   PA #/Case ID/Reference #: [id number]

## 2024-08-27 NOTE — Telephone Encounter (Signed)
Pt informed

## 2024-08-27 NOTE — Addendum Note (Signed)
 Addended byBETHA TOBIE DOWNS on: 08/27/2024 02:20 PM   Modules accepted: Orders

## 2024-09-17 NOTE — Progress Notes (Signed)
 Pharmacy Quality Measure Review  This patient is appearing on a report for being at risk of failing the adherence measure for hypertension (ACEi/ARB) medications this calendar year.   Medication: Losartan  25 mg Last fill date: 08/07/24 for 90 day supply  Insurance report was not up to date. No action needed at this time.   Jenkins Graces, PharmD PGY1 Pharmacy Resident

## 2024-10-15 ENCOUNTER — Other Ambulatory Visit: Payer: Self-pay | Admitting: Internal Medicine

## 2024-10-15 DIAGNOSIS — I82409 Acute embolism and thrombosis of unspecified deep veins of unspecified lower extremity: Secondary | ICD-10-CM

## 2024-10-31 ENCOUNTER — Ambulatory Visit: Admitting: Dermatology

## 2024-10-31 ENCOUNTER — Encounter: Payer: Self-pay | Admitting: Dermatology

## 2024-10-31 VITALS — BP 143/87

## 2024-10-31 DIAGNOSIS — L209 Atopic dermatitis, unspecified: Secondary | ICD-10-CM

## 2024-10-31 MED ORDER — CLOBETASOL PROPIONATE 0.05 % EX OINT: 1.0000 | TOPICAL_OINTMENT | Freq: Two times a day (BID) | CUTANEOUS | 5 refills | Status: AC

## 2024-10-31 NOTE — Progress Notes (Signed)
" ° °  New Patient Visit   Subjective  Bruce Adkins is a 69 y.o. male who presents for a NEW PATIENT appointment to be examined for the concerns as listed below.   Skin Issue: Pt stated a few years ago he has out breaks of lesions that comes and goes across the body. He described them as red acne-like pimples/rash recently on the shoulder, stomach and ankle. They were not painful. He would use calamine lotion due to the itch - rating the itch 5/10. He has also tried OTC hydrocortisone but it was not helpful. He stated he is clear today but provided photos of a previous flare.    Denied Hx of Bx. Denied family Hx of skin cancer.   The following portions of the chart were reviewed this encounter and updated as appropriate: medications, allergies, medical history  Review of Systems:  No other skin or systemic complaints except as noted in HPI or Assessment and Plan.  Objective  Well appearing patient in no apparent distress; mood and affect are within normal limits.   A focused examination was performed of the following areas: shoulder, stomach & ankle   Relevant exam findings are noted in the Assessment and Plan.           Assessment & Plan   Atopic dermatitis Chronic intermittent rash for approximately three years, presenting as small red papules that become pruritic and resolve in about a week. Lesions appear on hands, arms, and legs, sometimes leaving scars or hyperpigmentation. No childhood eczema. Possible triggers include fragrances in detergents and lotions, and cold weather. Differential diagnosis includes eczema due to intermittent nature and presentation. Discussed potential allergens and the importance of identifying triggers to prevent new lesions. Consideration of patch testing for allergens if needed. Clobetasol  is effective for quick relief and resolution of lesions. Discussed risks of prolonged use, including skin thinning, and the importance of breaks in treatment.  Alternative treatments include injectables if topical therapy is ineffective. BSA 1%,   - Prescribed clobetasol  cream for topical application to affected areas, with instructions to use for up to two weeks and then take a two-week break. - Provided samples of CeraVe anti-itch moisturizer with pramoxine for daily use to hydrate and calm irritated nerve endings. - Advised use of fragrance-free detergents and lotions to avoid potential triggers. - Discussed potential referral to an allergist for patch testing if needed to identify specific allergens. - Scheduled follow-up appointment in October before the weather gets cold again. - Instructed to call if the cream is not effective or if there are any emergencies over the summer.  Topical steroids (such as triamcinolone , fluocinolone, fluocinonide, mometasone, clobetasol , halobetasol, betamethasone, hydrocortisone) can cause thinning and lightening of the skin if they are used for too long in the same area. Your physician has selected the right strength medicine for your problem and area affected on the body. Please use your medication only as directed by your physician to prevent side effects.     No follow-ups on file.   Documentation: I have reviewed the above documentation for accuracy and completeness, and I agree with the above.  I, Shirron Maranda, CMA II, am acting as scribe for:   Delon Lenis, DO     "

## 2024-10-31 NOTE — Patient Instructions (Addendum)
 VISIT SUMMARY:  During your visit, we discussed your recurrent rash that has been troubling you for about three years. The rash appears as small red pimples that become raised and itchy, and it can occur on various parts of your body. We talked about potential triggers and treatment options.  YOUR PLAN:  -ATOPIC DERMATITIS:  Atopic dermatitis is a condition that causes your skin to become red, itchy, and inflamed. It can be triggered by allergens such as fragrances in lotions and detergents, as well as cold weather.   We discussed the importance of identifying and avoiding these triggers. You have been prescribed clobetasol  cream to apply to the affected areas for up to two weeks, followed by a two-week break to avoid side effects.   Additionally, you should continue using CeraVe anti-itch moisturizer daily to help hydrate your skin and calm irritation. If needed, we may refer you to an allergist for patch testing to identify specific allergens.  INSTRUCTIONS:  Please use the clobetasol  cream as directed, applying it to the affected areas for up to two weeks, then taking a two-week break. Continue using the CeraVe anti-itch moisturizer daily. Avoid using fragranced detergents and lotions. If the cream is not effective or if you experience any emergencies over the summer, please call us . We have scheduled a follow-up appointment in October before the weather gets cold again.   Topical steroids (such as triamcinolone , fluocinolone, fluocinonide, mometasone, clobetasol , halobetasol, betamethasone, hydrocortisone) can cause thinning and lightening of the skin if they are used for too long in the same area. Your physician has selected the right strength medicine for your problem and area affected on the body. Please use your medication only as directed by your physician to prevent side effects.   Important Information  Due to recent changes in healthcare laws, you may see results of your pathology  and/or laboratory studies on MyChart before the doctors have had a chance to review them. We understand that in some cases there may be results that are confusing or concerning to you. Please understand that not all results are received at the same time and often the doctors may need to interpret multiple results in order to provide you with the best plan of care or course of treatment. Therefore, we ask that you please give us  2 business days to thoroughly review all your results before contacting the office for clarification. Should we see a critical lab result, you will be contacted sooner.   If You Need Anything After Your Visit  If you have any questions or concerns for your doctor, please call our main line at [phone] If no one answers, please leave a voicemail as directed and we will return your call as soon as possible. Messages left after 4 pm will be answered the following business day.   You may also send us  a message via MyChart. We typically respond to MyChart messages within 1-2 business days.  For prescription refills, please ask your pharmacy to contact our office. Our fax number is [phone].  If you have an urgent issue when the clinic is closed that cannot wait until the next business day, you can page your doctor at the number below.    Please note that while we do our best to be available for urgent issues outside of office hours, we are not available 24/7.   If you have an urgent issue and are unable to reach us , you may choose to seek medical care at your doctor's office, retail  clinic, urgent care center, or emergency room.  If you have a medical emergency, please immediately call 911 or go to the emergency department. In the event of inclement weather, please call our main line at [phone] for an update on the status of any delays or closures.  Dermatology Medication Tips: Please keep the boxes that topical medications come in in order to help keep track of  the instructions about where and how to use these. Pharmacies typically print the medication instructions only on the boxes and not directly on the medication tubes.   If your medication is too expensive, please contact our office at [phone] or send us  a message through MyChart.   We are unable to tell what your co-pay for medications will be in advance as this is different depending on your insurance coverage. However, we may be able to find a substitute medication at lower cost or fill out paperwork to get insurance to cover a needed medication.   If a prior authorization is required to get your medication covered by your insurance company, please allow us  1-2 business days to complete this process.  Drug prices often vary depending on where the prescription is filled and some pharmacies may offer cheaper prices.  The website www.goodrx.com contains coupons for medications through different pharmacies. The prices here do not account for what the cost may be with help from insurance (it may be cheaper with your insurance), but the website can give you the price if you did not use any insurance.  - You can print the associated coupon and take it with your prescription to the pharmacy.  - You may also stop by our office during regular business hours and pick up a GoodRx coupon card.  - If you need your prescription sent electronically to a different pharmacy, notify our office through [facility] or by phone at [phone]

## 2024-11-18 ENCOUNTER — Ambulatory Visit: Payer: PPO

## 2024-12-25 ENCOUNTER — Ambulatory Visit: Admitting: Internal Medicine

## 2025-02-10 ENCOUNTER — Ambulatory Visit: Admitting: Urology

## 2025-07-23 ENCOUNTER — Ambulatory Visit: Admitting: Dermatology
